# Patient Record
Sex: Male | Born: 1955 | Race: Black or African American | Hispanic: No | Marital: Married | Smoking: Former smoker
Health system: Southern US, Community
[De-identification: ages and names within clinical notes are randomized; demographics above are authoritative.]

## PROBLEM LIST (undated history)

## (undated) DIAGNOSIS — K219 Gastro-esophageal reflux disease without esophagitis: Secondary | ICD-10-CM

## (undated) DIAGNOSIS — E079 Disorder of thyroid, unspecified: Secondary | ICD-10-CM

## (undated) DIAGNOSIS — K635 Polyp of colon: Secondary | ICD-10-CM

## (undated) DIAGNOSIS — E785 Hyperlipidemia, unspecified: Secondary | ICD-10-CM

## (undated) DIAGNOSIS — I10 Essential (primary) hypertension: Secondary | ICD-10-CM

## (undated) DIAGNOSIS — M1990 Unspecified osteoarthritis, unspecified site: Secondary | ICD-10-CM

## (undated) HISTORY — PX: ELBOW SURGERY: SHX618

## (undated) HISTORY — DX: Gastro-esophageal reflux disease without esophagitis: K21.9

## (undated) HISTORY — DX: Unspecified osteoarthritis, unspecified site: M19.90

## (undated) HISTORY — DX: Essential (primary) hypertension: I10

## (undated) HISTORY — PX: HERNIA REPAIR: SHX51

## (undated) HISTORY — PX: ROTATOR CUFF REPAIR: SHX139

## (undated) HISTORY — DX: Disorder of thyroid, unspecified: E07.9

## (undated) HISTORY — PX: KNEE SURGERY: SHX244

## (undated) HISTORY — PX: EYE SURGERY: SHX253

## (undated) HISTORY — DX: Hyperlipidemia, unspecified: E78.5

## (undated) HISTORY — DX: Polyp of colon: K63.5

---

## 2018-04-06 LAB — THYROID PEROXIDASE ANTIBODIES (TPO) (REFL): Thyroid Peroxidase Ab: 31.6

## 2018-04-06 LAB — TSH: TSH: 5.49 (ref 0.41–5.90)

## 2018-07-29 LAB — URIC ACID: Uric Acid: 5.6

## 2018-09-01 LAB — URIC ACID: Uric Acid: 4.6

## 2018-12-24 LAB — CBC AND DIFFERENTIAL
HCT: 42 (ref 41–53)
Hemoglobin: 13.8 (ref 13.5–17.5)
Platelets: 233 (ref 150–399)
WBC: 3.8

## 2018-12-24 LAB — FECAL OCCULT BLOOD, GUAIAC: Fecal Occult Blood: NEGATIVE

## 2018-12-24 LAB — CBC: RBC: 4.82 (ref 3.87–5.11)

## 2018-12-24 LAB — BASIC METABOLIC PANEL
BUN: 8 (ref 4–21)
CO2: 21 (ref 13–22)
Chloride: 105 (ref 99–108)
Creatinine: 1 (ref 0.6–1.3)
Glucose: 86
Potassium: 4 (ref 3.4–5.3)
Sodium: 142 (ref 137–147)

## 2018-12-24 LAB — TSH: TSH: 4.11 (ref 0.41–5.90)

## 2018-12-24 LAB — VITAMIN D 25 HYDROXY (VIT D DEFICIENCY, FRACTURES): Vit D, 25-Hydroxy: 34.9

## 2018-12-24 LAB — HEPATIC FUNCTION PANEL
ALT: 46 — AB (ref 10–40)
AST: 29 (ref 14–40)
Alkaline Phosphatase: 78 (ref 25–125)
Bilirubin, Total: 0.8

## 2018-12-24 LAB — HEMOGLOBIN A1C: Hemoglobin A1C: 5.7

## 2018-12-24 LAB — LIPID PANEL
Cholesterol: 105 (ref 0–200)
HDL: 39 (ref 35–70)
LDL Cholesterol: 42
Triglycerides: 118 (ref 40–160)

## 2018-12-24 LAB — COMPREHENSIVE METABOLIC PANEL: Calcium: 9.9 (ref 8.7–10.7)

## 2018-12-24 LAB — PSA: PSA: 0.8

## 2019-07-12 ENCOUNTER — Encounter: Payer: Self-pay | Admitting: Family Medicine

## 2019-07-12 ENCOUNTER — Other Ambulatory Visit: Payer: Self-pay

## 2019-07-12 ENCOUNTER — Ambulatory Visit: Payer: Federal, State, Local not specified - PPO | Admitting: Family Medicine

## 2019-07-12 VITALS — BP 140/80 | HR 63 | Temp 96.3°F | Ht 67.0 in | Wt 216.8 lb

## 2019-07-12 DIAGNOSIS — I10 Essential (primary) hypertension: Secondary | ICD-10-CM

## 2019-07-12 DIAGNOSIS — M109 Gout, unspecified: Secondary | ICD-10-CM | POA: Diagnosis not present

## 2019-07-12 DIAGNOSIS — M1A9XX0 Chronic gout, unspecified, without tophus (tophi): Secondary | ICD-10-CM | POA: Insufficient documentation

## 2019-07-12 DIAGNOSIS — Z1211 Encounter for screening for malignant neoplasm of colon: Secondary | ICD-10-CM

## 2019-07-12 DIAGNOSIS — Z8739 Personal history of other diseases of the musculoskeletal system and connective tissue: Secondary | ICD-10-CM | POA: Insufficient documentation

## 2019-07-12 DIAGNOSIS — E782 Mixed hyperlipidemia: Secondary | ICD-10-CM

## 2019-07-12 DIAGNOSIS — R200 Anesthesia of skin: Secondary | ICD-10-CM | POA: Diagnosis not present

## 2019-07-12 DIAGNOSIS — E785 Hyperlipidemia, unspecified: Secondary | ICD-10-CM | POA: Insufficient documentation

## 2019-07-12 NOTE — Assessment & Plan Note (Signed)
Pt endorses not taking medication. Will f/u outside records and assess risk and readdress as needed

## 2019-07-12 NOTE — Patient Instructions (Addendum)
Gout - Stop colchicine - If not Gout symptoms after 2 weeks - Then stop the Allopurinol  - return if you get a gout flare   If you get a gout flare 1) Take Colchicine 0.6 mg Three times a day on the first day 2) Take Colchicine 0.6 mg twice a day on day 2 and until 2 days after symptoms improved 3) Make appointment if no improvement by day 2  #High Blood Pressure - Do home monitoring - if blood pressure >140/90 on a regular basis - make a follow-up appointment to discuss

## 2019-07-12 NOTE — Assessment & Plan Note (Signed)
No issues recently. Treats with prn oxcarbazepine

## 2019-07-12 NOTE — Assessment & Plan Note (Signed)
Mildly elevated. Home monitoring and return if still high. Cont metoprolol and amlodipine

## 2019-07-12 NOTE — Progress Notes (Signed)
Subjective:     Alex Castaneda is a 64 y.o. male presenting for Establish Care     HPI   #gout - flare x 1 - started suppression x 1 year - no symptoms since then - did require multiple course of treatment for previous episode  Numbness in right cheek with cold weather  #HTN - moved about 1 month ago - has not checked his bp at home - took medication a little later than normal  #HLD - has not taking the lipitor due to getting regular blood work  Review of Systems   Social History   Tobacco Use  Smoking Status Former Smoker  . Packs/day: 1.00  . Years: 18.00  . Pack years: 18.00  . Types: Cigarettes  . Quit date: 07/11/1989  . Years since quitting: 30.0  Smokeless Tobacco Never Used        Objective:    BP Readings from Last 3 Encounters:  07/12/19 140/80   Wt Readings from Last 3 Encounters:  07/12/19 216 lb 12.8 oz (98.3 kg)    BP 140/80   Pulse 63   Temp (!) 96.3 F (35.7 C) (Temporal)   Ht 5\' 7"  (1.702 m)   Wt 216 lb 12.8 oz (98.3 kg)   SpO2 100%   BMI 33.96 kg/m    Physical Exam Constitutional:      Appearance: Normal appearance. He is not ill-appearing or diaphoretic.  HENT:     Right Ear: External ear normal.     Left Ear: External ear normal.  Eyes:     General: No scleral icterus.    Extraocular Movements: Extraocular movements intact.     Conjunctiva/sclera: Conjunctivae normal.  Cardiovascular:     Rate and Rhythm: Normal rate and regular rhythm.     Heart sounds: No murmur.  Pulmonary:     Effort: Pulmonary effort is normal. No respiratory distress.     Breath sounds: Normal breath sounds. No wheezing.  Musculoskeletal:     Cervical back: Neck supple.  Skin:    General: Skin is warm and dry.  Neurological:     Mental Status: He is alert. Mental status is at baseline.  Psychiatric:        Mood and Affect: Mood normal.           Assessment & Plan:   Problem List Items Addressed This Visit      Cardiovascular  and Mediastinum   Hypertension - Primary    Mildly elevated. Home monitoring and return if still high. Cont metoprolol and amlodipine      Relevant Medications   amLODipine (NORVASC) 5 MG tablet   atorvastatin (LIPITOR) 20 MG tablet   metoprolol tartrate (LOPRESSOR) 50 MG tablet     Other   Gout attack    Discussed stopping preventative medication as hx of only 1 gout attack. Will trial stopping and if gout attack will return to discuss restarting and management options      Facial numbness    No issues recently. Treats with prn oxcarbazepine      Hyperlipidemia    Pt endorses not taking medication. Will f/u outside records and assess risk and readdress as needed      Relevant Medications   amLODipine (NORVASC) 5 MG tablet   atorvastatin (LIPITOR) 20 MG tablet   metoprolol tartrate (LOPRESSOR) 50 MG tablet    Other Visit Diagnoses    Colon cancer screening       Relevant Orders   Ambulatory  referral to Gastroenterology       Return in about 6 months (around 01/09/2020).  Lesleigh Noe, MD

## 2019-07-12 NOTE — Assessment & Plan Note (Signed)
Discussed stopping preventative medication as hx of only 1 gout attack. Will trial stopping and if gout attack will return to discuss restarting and management options

## 2019-07-13 ENCOUNTER — Encounter: Payer: Self-pay | Admitting: Family Medicine

## 2019-07-13 ENCOUNTER — Telehealth: Payer: Self-pay | Admitting: Family Medicine

## 2019-07-13 DIAGNOSIS — M1990 Unspecified osteoarthritis, unspecified site: Secondary | ICD-10-CM

## 2019-07-13 DIAGNOSIS — K219 Gastro-esophageal reflux disease without esophagitis: Secondary | ICD-10-CM | POA: Insufficient documentation

## 2019-07-13 DIAGNOSIS — E559 Vitamin D deficiency, unspecified: Secondary | ICD-10-CM | POA: Insufficient documentation

## 2019-07-13 HISTORY — DX: Unspecified osteoarthritis, unspecified site: M19.90

## 2019-07-13 NOTE — Telephone Encounter (Signed)
Record request re sent for the dates they do have records for

## 2019-07-13 NOTE — Telephone Encounter (Signed)
Dr. Channing Mutters office called regarding medical records request. States it was requesting records from 2018-present. She said patient was last seen in 2017 so they do not have records from that time frame.

## 2019-07-19 ENCOUNTER — Encounter: Payer: Self-pay | Admitting: Family Medicine

## 2019-07-19 DIAGNOSIS — G500 Trigeminal neuralgia: Secondary | ICD-10-CM

## 2019-07-19 HISTORY — DX: Trigeminal neuralgia: G50.0

## 2019-07-20 ENCOUNTER — Encounter: Payer: Self-pay | Admitting: Family Medicine

## 2019-07-20 DIAGNOSIS — M48061 Spinal stenosis, lumbar region without neurogenic claudication: Secondary | ICD-10-CM | POA: Insufficient documentation

## 2019-07-20 HISTORY — DX: Spinal stenosis, lumbar region without neurogenic claudication: M48.061

## 2019-07-26 ENCOUNTER — Telehealth: Payer: Self-pay | Admitting: Gastroenterology

## 2019-07-26 NOTE — Telephone Encounter (Signed)
DOD 07/23/19   Dr. Ardis Hughs, there is a referral from Dr. Einar Pheasant for a colonoscopy.  Pt's 2015 colonoscopy report will be sent to you for review.  Please advise scheduling.

## 2019-09-09 ENCOUNTER — Other Ambulatory Visit: Payer: Self-pay

## 2019-09-09 ENCOUNTER — Ambulatory Visit: Payer: Federal, State, Local not specified - PPO | Admitting: Primary Care

## 2019-09-09 ENCOUNTER — Encounter: Payer: Self-pay | Admitting: Primary Care

## 2019-09-09 VITALS — BP 136/80 | HR 59 | Temp 97.0°F | Ht 67.0 in | Wt 218.5 lb

## 2019-09-09 DIAGNOSIS — M79672 Pain in left foot: Secondary | ICD-10-CM

## 2019-09-09 DIAGNOSIS — G8929 Other chronic pain: Secondary | ICD-10-CM | POA: Diagnosis not present

## 2019-09-09 DIAGNOSIS — Z8739 Personal history of other diseases of the musculoskeletal system and connective tissue: Secondary | ICD-10-CM | POA: Diagnosis not present

## 2019-09-09 DIAGNOSIS — M109 Gout, unspecified: Secondary | ICD-10-CM

## 2019-09-09 LAB — BASIC METABOLIC PANEL
BUN: 9 mg/dL (ref 6–23)
CO2: 28 mEq/L (ref 19–32)
Calcium: 9.6 mg/dL (ref 8.4–10.5)
Chloride: 106 mEq/L (ref 96–112)
Creatinine, Ser: 1.07 mg/dL (ref 0.40–1.50)
GFR: 84.53 mL/min (ref >60.00)
Glucose, Bld: 99 mg/dL (ref 70–99)
Potassium: 3.8 mEq/L (ref 3.5–5.1)
Sodium: 140 mEq/L (ref 135–145)

## 2019-09-09 LAB — URIC ACID: Uric Acid, Serum: 4.3 mg/dL (ref 4.0–7.8)

## 2019-09-09 NOTE — Assessment & Plan Note (Signed)
Unclear if his symptoms today are secondary to gout, could be arthritis, especially since his pain is in an entirely different location. No left ankle pain or swelling.   Check uric acid level today. Offered xray for evaluation of arthritis, he kindly declines.   Continue allopurinol 200 mg and colchicine 0.6 mg daily for now.

## 2019-09-09 NOTE — Patient Instructions (Signed)
Stop by the lab prior to leaving today. I will notify you of your results once received.   It was a pleasure meeting you!  

## 2019-09-09 NOTE — Progress Notes (Signed)
Subjective:    Patient ID: Alex Castaneda, male    DOB: 09-19-1955, 64 y.o.   MRN: [id number]  HPI  This visit occurred during the SARS-CoV-2 public health emergency.  Safety protocols were in place, including screening questions prior to the visit, additional usage of staff PPE, and extensive cleaning of exam room while observing appropriate contact time as indicated for disinfecting solutions.   Alex Castaneda is a 64 year old male patient of Dr. Einar Pheasant with a history of GERD, chronic gout, hypertension (not on diuretics), trigeminal neuralgia who presents today with a chief complaint of gout attack.  Currently managed on allopurinol 200 mg daily and colchicine 0.6 mg daily. History of one gout attack about 14 months ago, symptoms of left ankle pain and significant swelling at the time.  He had been on allopurinol 200 mg for one year, weaned off of allopurinol in January 2021 as he had no gout flares in one year. Two weeks after weaning off he noticed pain to the left 5th metatarsal joint. He resumed allopurinol 200 mg and colchicine 0.6 mg  in early February 2021 with improvement but without resolve.  He denies pain and swelling to the left ankle. He continues to have pain to the left 5th metatarsal joint. Pain is worse in the morning "when my feet hit the floor", some reduction during the day, worse with increased activity.   He denies erythema, swelling, recent injury.  BP Readings from Last 3 Encounters:  09/09/19 136/80  07/12/19 140/80     Review of Systems  Musculoskeletal: Positive for arthralgias. Negative for joint swelling.  Skin: Negative for color change.  Neurological: Negative for numbness.       Past Medical History:  Diagnosis Date  . Arthritis   . Colon polyps   . GERD (gastroesophageal reflux disease)   . Hyperlipidemia   . Hypertension   . Thyroid disease      Social History   Socioeconomic History  . Marital status: Married    Spouse name: Chrys Racer  .  Number of children: 2  . Years of education: Associates degree  . Highest education level: Not on file  Occupational History  . Not on file  Tobacco Use  . Smoking status: Former Smoker    Packs/day: 1.00    Years: 18.00    Pack years: 18.00    Types: Cigarettes    Quit date: 07/11/1989    Years since quitting: 30.1  . Smokeless tobacco: Never Used  Substance and Sexual Activity  . Alcohol use: Not Currently    Comment: 1 drink 1-2 time a month  . Drug use: Never  . Sexual activity: Not Currently  Other Topics Concern  . 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 Difficulty of  Paying Living Expenses:   Food Insecurity:   . Worried About Charity fundraiser in the Last Year:   . Arboriculturist in the Last Year:   Transportation Needs:   . Film/video editor (Medical):   Marland Kitchen Lack of Transportation (Non-Medical):   Physical Activity:   . Days of Exercise per Week:   . Minutes of Exercise per Session:   Stress:   . Feeling of Stress :   Social Connections:   . Frequency of Communication with Friends and Family:   . Frequency of Social Gatherings with Friends and Family:   . Attends Religious Services:   . Active Member of Clubs or Organizations:   . Attends Archivist Meetings:   Marland Kitchen Marital Status:   Intimate Partner Violence:   . Fear of Current or Ex-Partner:   . Emotionally Abused:   Marland Kitchen Physically Abused:   . Sexually Abused:       Past Surgical History:  Procedure Laterality Date  . ELBOW SURGERY Right   . HERNIA REPAIR     Umbilical  . KNEE SURGERY Left    arthroscopic  . ROTATOR CUFF REPAIR Right     Family History  Problem Relation Age of Onset  . Throat cancer Mother        smoker  . COPD Sister   . Cancer Brother        unknown  . Arthritis Sister   . Diabetes Sister   . Diabetes Sister   . Stroke Brother 72       tobacco use    No Known Allergies  Current Outpatient Medications on File Prior to Visit  Medication Sig Dispense Refill  . allopurinol (ZYLOPRIM) 100 MG tablet Take 100 mg by mouth daily.    Marland Kitchen amLODipine (NORVASC) 5 MG tablet Take 5 mg by mouth daily.    Marland Kitchen atorvastatin (LIPITOR) 20 MG tablet Take 20 mg by mouth daily.    . colchicine 0.6 MG tablet Take 0.6 mg by mouth daily.    . metoprolol tartrate (LOPRESSOR) 50 MG tablet Take 50 mg by mouth 2 (two) times daily.    Marland Kitchen omeprazole (PRILOSEC) 20 MG capsule Take 20 mg by mouth daily.    . Oxcarbazepine (TRILEPTAL) 300 MG tablet Take 300 mg by mouth 2 (two) times daily.     No current facility-administered medications on file prior to visit.    BP 136/80   Pulse (!) 59   Temp (!) 97 F (36.1 C) (Temporal)   Ht 5\' 7"  (1.702 m)   Wt 218 lb 8 oz (99.1 kg)   SpO2 99%   BMI 34.22 kg/m    Objective:   Physical Exam  Constitutional: He appears well-nourished.  Cardiovascular:  Pulses:      Dorsalis pedis pulses are 2+ on the right side and 2+ on the left side.       Posterior tibial pulses are 2+ on the right side and 2+ on the left side.  Respiratory: Effort normal.  Musculoskeletal:     Right foot: Normal range of motion. No deformity or tenderness.     Left foot: Normal range of motion. Tenderness present. No deformity.       Feet:     Comments: Mild tenderness to left 5th metatarsal joint as noted in picture.   Skin: Skin is warm and dry. No erythema.           Assessment & Plan:

## 2019-09-13 ENCOUNTER — Other Ambulatory Visit: Payer: Self-pay

## 2019-09-13 ENCOUNTER — Ambulatory Visit (INDEPENDENT_AMBULATORY_CARE_PROVIDER_SITE_OTHER)
Admission: RE | Admit: 2019-09-13 | Discharge: 2019-09-13 | Disposition: A | Discharge status: Home / Self Care | Payer: Federal, State, Local not specified - PPO | Source: Ambulatory Visit | Attending: Primary Care | Admitting: Primary Care

## 2019-09-13 ENCOUNTER — Ambulatory Visit: Payer: Federal, State, Local not specified - PPO | Admitting: Primary Care

## 2019-09-13 VITALS — BP 134/84 | HR 63 | Temp 96.7°F | Ht 67.0 in | Wt 216.5 lb

## 2019-09-13 DIAGNOSIS — G8929 Other chronic pain: Secondary | ICD-10-CM

## 2019-09-13 DIAGNOSIS — M79672 Pain in left foot: Secondary | ICD-10-CM

## 2019-09-13 MED ORDER — NAPROXEN SODIUM 550 MG PO TABS: 550.0000 mg | ORAL_TABLET | Freq: Two times a day (BID) | ORAL | 0 refills | Status: DC

## 2019-09-13 NOTE — Patient Instructions (Signed)
Complete xray(s) prior to leaving today. I will notify you of your results once received.  You may take the naproxen sodium medication twice daily with a meal for pain and inflammation. Do this for 5-7 days.  Please update Dr. Einar Pheasant or myself if no improvement.   It was a pleasure to see you today!

## 2019-09-13 NOTE — Assessment & Plan Note (Signed)
Not associated with gout as suspected, uric acid level of 4.3 with normal renal function.  Suspect osteoarthritis. Check plain films to rule out tophi and evaluate. Exam today overall benign.  Discussed treatment with NSAID's, orthotics, insoles into shoes, stretching. Consider podiatry evaluation if needed.

## 2019-09-13 NOTE — Progress Notes (Signed)
Subjective:    Patient ID: Alex Castaneda, male    DOB: 1955/07/20, 64 y.o.   MRN: [id number]  HPI  This visit occurred during the SARS-CoV-2 public health emergency.  Safety protocols were in place, including screening questions prior to the visit, additional usage of staff PPE, and extensive cleaning of exam room while observing appropriate contact time as indicated for disinfecting solutions.   Alex Castaneda is a 64 year old male with a history of chronic gout, trigeminal neuralgia, osteoarthritis, hyperlipidemia who presents today with a chief complaint of foot pain.  He was last evaluated on 09/09/19 with a 6 week history of dorsal left 5th metatarsal joint pain. Had resumed allopurinol 200 mg in early February, one history of gout attack total. Symptoms during his last visit were not located the site of his prior gout attack location. During his last visit we checked his uric acid level which was 4.3 on allopurinol 200 mg. Normal renal function.   Since his last visit he's continued to notice left 5th metatarsal and left lateral dorsal foot pain. Pain is worse in the morning and with activity, improved with rest. He's also started noticing left knee pain. He's not taking anything OTC for symptoms. He no longer wears orthotics for which he once wore due to a bunion.   He denies joint swelling, erythema, trauma. His pain is about the same.   Review of Systems  Musculoskeletal: Positive for arthralgias. Negative for joint swelling.  Skin: Negative for color change.  Neurological: Negative for numbness.       Past Medical History:  Diagnosis Date  . Arthritis   . Colon polyps   . GERD (gastroesophageal reflux disease)   . Hyperlipidemia   . Hypertension   . Thyroid disease      Social History   Socioeconomic History  . Marital status: Married    Spouse name: Alex Castaneda  . Number of children: 2  . Years of education: Associates degree  . Highest education level: Not on file    Occupational History  . Not on file  Tobacco Use  . Smoking status: Former Smoker    Packs/day: 1.00    Years: 18.00    Pack years: 18.00    Types: Cigarettes    Quit date: 07/11/1989    Years since quitting: 30.1  . Smokeless tobacco: Never Used  Substance and Sexual Activity  . Alcohol use: Not Currently    Comment: 1 drink 1-2 time a month  . Drug use: Never  . Sexual activity: Not Currently  Other Topics Concern  . 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 Difficulty of Paying Living Expenses:   Food Insecurity:   . Worried About Charity fundraiser in the Last Year:   .  Ran Out of Food in the Last Year:   Transportation Needs:   . Film/video editor (Medical):   Marland Kitchen Lack of Transportation (Non-Medical):   Physical Activity:   . Days of Exercise per Week:   . Minutes of Exercise per Session:   Stress:   . Feeling of Stress :   Social Connections:   . Frequency of Communication with Friends and Family:   . Frequency of Social Gatherings with Friends and Family:   . Attends Religious Services:   . Active Member of Clubs or Organizations:   . Attends Archivist Meetings:   Marland Kitchen Marital Status:   Intimate Partner Violence:   . Fear of Current or Ex-Partner:   . Emotionally Abused:   Marland Kitchen Physically Abused:   . Sexually Abused:     Past Surgical History:  Procedure Laterality Date  . ELBOW SURGERY Right   . HERNIA REPAIR      Umbilical  . KNEE SURGERY Left    arthroscopic  . ROTATOR CUFF REPAIR Right     Family History  Problem Relation Age of Onset  . Throat cancer Mother        smoker  . COPD Sister   . Cancer Brother        unknown  . Arthritis Sister   . Diabetes Sister   . Diabetes Sister   . Stroke Brother 4       tobacco use    No Known Allergies  Current Outpatient Medications on File Prior to Visit  Medication Sig Dispense Refill  . allopurinol (ZYLOPRIM) 100 MG tablet Take 100 mg by mouth daily.    Marland Kitchen amLODipine (NORVASC) 5 MG tablet Take 5 mg by mouth daily.    Marland Kitchen atorvastatin (LIPITOR) 20 MG tablet Take 20 mg by mouth daily.    . colchicine 0.6 MG tablet Take 0.6 mg by mouth daily.    . metoprolol tartrate (LOPRESSOR) 50 MG tablet Take 50 mg by mouth 2 (two) times daily.    Marland Kitchen omeprazole (PRILOSEC) 20 MG capsule Take 20 mg by mouth daily.    . Oxcarbazepine (TRILEPTAL) 300 MG tablet Take 300 mg by mouth 2 (two) times daily.     No current facility-administered medications on file prior to visit.    BP 134/84   Pulse 63   Temp (!) 96.7 F (35.9 C) (Temporal)   Ht 5\' 7"  (1.702 m)   Wt 216 lb 8 oz (98.2 kg)   SpO2 98%   BMI 33.91 kg/m    Objective:   Physical Exam  Constitutional: He appears well-nourished.  Cardiovascular:  Pulses:      Dorsalis pedis pulses are 2+ on the left side.       Posterior tibial pulses are 2+ on the left side.  Respiratory: Effort normal.  Musculoskeletal:     Left foot: Normal range of motion. No swelling, deformity, tenderness or bony tenderness.       Feet:  Skin: Skin is warm and dry. No erythema.           Assessment & Plan:

## 2019-09-19 ENCOUNTER — Other Ambulatory Visit: Payer: Self-pay | Admitting: Family Medicine

## 2019-09-20 NOTE — Telephone Encounter (Signed)
Colchicine has not been filled by Dr Einar Pheasant before yet.  LOV 09/13/19 with Alma Friendly for chronic foot pain. LOV with Dr Einar Pheasant was on 07/12/19. Future appointment on 01/10/20

## 2019-09-24 ENCOUNTER — Encounter: Payer: Self-pay | Admitting: Family Medicine

## 2019-09-25 ENCOUNTER — Encounter: Payer: Self-pay | Admitting: Family Medicine

## 2019-09-27 ENCOUNTER — Encounter: Payer: Self-pay | Admitting: Family Medicine

## 2019-09-27 NOTE — Telephone Encounter (Signed)
Abstracted Uric acids x 2

## 2019-09-27 NOTE — Telephone Encounter (Signed)
Dr Einar Pheasant I updated vaccine record. Lab results I did not abstract since we had more recent one in the system but let me know if you need me to abstract any.

## 2019-09-27 NOTE — Telephone Encounter (Signed)
Please abstract the last 2 uric acid levels.

## 2019-09-30 MED ORDER — AMLODIPINE BESYLATE 5 MG PO TABS: 5.0000 mg | ORAL_TABLET | Freq: Every day | ORAL | 3 refills | Status: DC

## 2019-09-30 NOTE — Telephone Encounter (Signed)
Dr Einar Pheasant I pulled in Amlodipine medication for review.

## 2019-10-04 ENCOUNTER — Encounter: Payer: Self-pay | Admitting: Family Medicine

## 2019-10-05 NOTE — Telephone Encounter (Signed)
Dr Einar Pheasant, I suggested that patient call us to set up an appointment.

## 2019-10-10 DIAGNOSIS — K047 Periapical abscess without sinus: Secondary | ICD-10-CM | POA: Diagnosis not present

## 2019-10-26 DIAGNOSIS — H02883 Meibomian gland dysfunction of right eye, unspecified eyelid: Secondary | ICD-10-CM | POA: Diagnosis not present

## 2019-11-10 ENCOUNTER — Encounter: Payer: Self-pay | Admitting: Primary Care

## 2019-11-10 ENCOUNTER — Other Ambulatory Visit: Payer: Self-pay

## 2019-11-10 ENCOUNTER — Ambulatory Visit: Payer: Federal, State, Local not specified - PPO | Admitting: Primary Care

## 2019-11-10 DIAGNOSIS — R519 Headache, unspecified: Secondary | ICD-10-CM | POA: Insufficient documentation

## 2019-11-10 MED ORDER — PREDNISONE 20 MG PO TABS: ORAL_TABLET | ORAL | 0 refills | Status: DC

## 2019-11-10 NOTE — Patient Instructions (Signed)
Start prednisone 20 mg. Take 2 tablets daily for five days.  Hold off on the Flonase for now.  You can use Tylenol if needed, no Ibuprofen.  It was a pleasure to see you today!

## 2019-11-10 NOTE — Assessment & Plan Note (Signed)
Acute for the last four days, exam and HPI with absence of bacterial involvement.   He has had some improvement on Flonase, is requesting oral steroids as this was very helpful in the past. Agreed to provide.  Rx for short burst of prednisone provided. He will update.

## 2019-11-10 NOTE — Progress Notes (Signed)
Subjective:    Patient ID: Alex Castaneda, male    DOB: 04/14/1956, 64 y.o.   MRN: [id number]  HPI  This visit occurred during the SARS-CoV-2 public health emergency.  Safety protocols were in place, including screening questions prior to the visit, additional usage of staff PPE, and extensive cleaning of exam room while observing appropriate contact time as indicated for disinfecting solutions.   Alex Castaneda is a 64 year old male with a history of GERD, trigeminal neuralgia who presents today with a chief complaint of sinus pressure.  He also reports frontal lobe headache with radiation down into the nasal cavity. He denies rhinorrhea, cough, ear pain, fevers, dental pain, itchy/watey eyes. Symptoms began about four days ago.   He's used Flonase with some improvement.   Review of Systems  Constitutional: Negative for fatigue and fever.  HENT: Positive for congestion and sinus pressure. Negative for ear pain, postnasal drip, rhinorrhea and sore throat.   Eyes: Negative for itching.  Respiratory: Negative for cough.   Neurological: Positive for headaches.       Past Medical History:  Diagnosis Date  . Arthritis   . Colon polyps   . GERD (gastroesophageal reflux disease)   . Hyperlipidemia   . Hypertension   . Thyroid disease      Social History   Socioeconomic History  . Marital status: Married    Spouse name: Chrys Racer  . Number of children: 2  . Years of education: Associates degree  . Highest education level: Not on file  Occupational History  . Not on file  Tobacco Use  . Smoking status: Former Smoker    Packs/day: 1.00    Years: 18.00    Pack years: 18.00    Types: Cigarettes    Quit date: 07/11/1989    Years since quitting: 30.3  . Smokeless tobacco: Never Used  Substance and Sexual Activity  . Alcohol use: Not Currently    Comment: 1 drink 1-2 time a month  . Drug use: Never  . Sexual activity: Not Currently  Other Topics Concern  . Not on file  Social  History Narrative   07/12/19   From: moved from [place] - but from [place] originally    Living: with wife Chrys Racer - 85 years   Work: retired from [employer]: 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, dispensing and [place] - live nearby ([place], [place]) - grandkids - 8 - and 2 great grandchildren      Enjoys: working on cars, walking, helping and supporting family      Exercise: walking - regularly, hard in the winter   Diet: better now      Safety   Seat belts: Yes    Guns: No   Safe in relationships: Yes    Social Determinants of Radio broadcast assistant Strain:   . Difficulty of Paying Living Expenses:   Food Insecurity:   . Worried About Charity fundraiser in the Last Year:   . Arboriculturist in the Last Year:   Transportation Needs:   . Film/video editor (Medical):   Marland Kitchen Lack of Transportation (Non-Medical):   Physical Activity:   . Days of Exercise per Week:   . Minutes of Exercise per Session:   Stress:   . Feeling of Stress :   Social Connections:   . Frequency of Communication with Friends and Family:   . Frequency of Social Gatherings with Friends and Family:   .  Attends Religious Services:   . Active Member of Clubs or Organizations:   . Attends Archivist Meetings:   Marland Kitchen Marital Status:   Intimate Partner Violence:   . Fear of Current or Ex-Partner:   . Emotionally Abused:   Marland Kitchen Physically Abused:   . Sexually Abused:     Past Surgical History:  Procedure Laterality Date  . ELBOW SURGERY Right   . HERNIA REPAIR     Umbilical  . KNEE SURGERY Left    arthroscopic  . ROTATOR CUFF REPAIR Right     Family History  Problem Relation Age of Onset  . Throat cancer Mother        smoker  . COPD Sister   . Cancer Brother        unknown  . Arthritis Sister   . Diabetes Sister   . Diabetes Sister   . Stroke Brother 2       tobacco use    No Known Allergies  Current Outpatient Medications on File Prior to Visit  Medication Sig Dispense Refill  . allopurinol  (ZYLOPRIM) 100 MG tablet Take 100 mg by mouth daily.    Marland Kitchen amLODipine (NORVASC) 5 MG tablet Take 1 tablet (5 mg total) by mouth daily. 90 tablet 3  . atorvastatin (LIPITOR) 20 MG tablet Take 20 mg by mouth daily.    . colchicine 0.6 MG tablet TAKE 1 TABLET BY MOUTH DAILY 30 tablet 1  . metoprolol tartrate (LOPRESSOR) 50 MG tablet Take 50 mg by mouth 2 (two) times daily.    . naproxen sodium (ANAPROX DS) 550 MG tablet Take 1 tablet (550 mg total) by mouth 2 (two) times daily with a meal. For pain and inflammation. 14 tablet 0  . omeprazole (PRILOSEC) 20 MG capsule Take 20 mg by mouth daily.    . Oxcarbazepine (TRILEPTAL) 300 MG tablet Take 300 mg by mouth 2 (two) times daily.     No current facility-administered medications on file prior to visit.    BP 130/82   Pulse 60   Temp (!) 96.1 F (35.6 C) (Temporal)   Ht 5\' 7"  (1.702 m)   Wt 217 lb 8 oz (98.7 kg)   SpO2 98%   BMI 34.07 kg/m    Objective:   Physical Exam  Constitutional: He appears well-nourished. He does not appear ill.  HENT:  Right Ear: Tympanic membrane and ear canal normal.  Left Ear: Tympanic membrane and ear canal normal.  Nose: No mucosal edema. Right sinus exhibits no maxillary sinus tenderness and no frontal sinus tenderness. Left sinus exhibits no maxillary sinus tenderness and no frontal sinus tenderness.  Mouth/Throat: Oropharynx is clear and moist.  Cardiovascular: Normal rate and regular rhythm.  Respiratory: Effort normal and breath sounds normal. He has no wheezes.  Musculoskeletal:     Cervical back: Neck supple.  Skin: Skin is warm and dry.           Assessment & Plan:

## 2019-11-16 ENCOUNTER — Other Ambulatory Visit: Payer: Self-pay

## 2019-11-16 ENCOUNTER — Encounter: Payer: Self-pay | Admitting: Podiatry

## 2019-11-16 ENCOUNTER — Ambulatory Visit (INDEPENDENT_AMBULATORY_CARE_PROVIDER_SITE_OTHER): Payer: Federal, State, Local not specified - PPO | Admitting: Podiatry

## 2019-11-16 DIAGNOSIS — M778 Other enthesopathies, not elsewhere classified: Secondary | ICD-10-CM

## 2019-11-16 MED ORDER — NAPROXEN 500 MG PO TABS: 500.0000 mg | ORAL_TABLET | Freq: Two times a day (BID) | ORAL | 0 refills | Status: DC

## 2019-11-16 NOTE — Progress Notes (Signed)
   HPI: 64 y.o. male presenting today as a new patient for evaluation of pain to the left foot.  Patient states that over the past 3-4 months he experienced sharp pain that developed to the lateral aspect of the left foot.  He would experience a stiff sore sharp ache to that area.  Pain was worse in the morning and it would slowly improve as the day went on and he began walking.  He went to his primary care physician who prescribed Naprosyn 2 times daily.  He also began walking about 1 week ago and this helped significantly.  Currently the patient has little to no pain associated with the left foot.  Past Medical History:  Diagnosis Date  . Arthritis   . Colon polyps   . GERD (gastroesophageal reflux disease)   . Hyperlipidemia   . Hypertension   . Thyroid disease      Physical Exam: General: The patient is alert and oriented x3 in no acute distress.  Dermatology: Skin is warm, dry and supple bilateral lower extremities. Negative for open lesions or macerations.  Vascular: Palpable pedal pulses bilaterally. No edema or erythema noted. Capillary refill within normal limits.  Neurological: Epicritic and protective threshold grossly intact bilaterally.   Musculoskeletal Exam: Range of motion within normal limits to all pedal and ankle joints bilateral. Muscle strength 5/5 in all groups bilateral.  There is some mild tenderness to the lateral aspect of the left midfoot with palpation   Assessment: 1.  Capsulitis left lateral foot-improving   Plan of Care:  1. Patient evaluated. X-Rays reviewed that were taken on 09/13/2019.  2.  Refill prescription for Naprosyn 500 mg 2 times daily to take as needed as needed pain 3.  Continue wearing good supportive Brooks running shoes 4.  Slowly resume full activity no restrictions over the next 3-4 weeks 5.  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 Edrick Kins, DPM    [address], [place] [address]                Office [phone]  Fax [phone]

## 2019-11-17 ENCOUNTER — Ambulatory Visit: Payer: Federal, State, Local not specified - PPO | Admitting: Family Medicine

## 2019-11-17 ENCOUNTER — Other Ambulatory Visit: Payer: Self-pay | Admitting: Family Medicine

## 2019-11-18 DIAGNOSIS — R519 Headache, unspecified: Secondary | ICD-10-CM

## 2019-11-18 MED ORDER — AMOXICILLIN-POT CLAVULANATE 875-125 MG PO TABS: 1.0000 | ORAL_TABLET | Freq: Two times a day (BID) | ORAL | 0 refills | Status: DC

## 2019-11-23 NOTE — Telephone Encounter (Signed)
Rx was last refilled 09/20/19 for #30 with 1 refill. Patient has upcoming appt on 01/10/20. Is this ok to refill?

## 2019-11-30 ENCOUNTER — Other Ambulatory Visit: Payer: Self-pay

## 2019-11-30 ENCOUNTER — Ambulatory Visit: Payer: Federal, State, Local not specified - PPO | Admitting: Family Medicine

## 2019-11-30 ENCOUNTER — Encounter: Payer: Self-pay | Admitting: Family Medicine

## 2019-11-30 VITALS — BP 122/78 | HR 64 | Temp 97.5°F | Ht 67.0 in | Wt 213.0 lb

## 2019-11-30 DIAGNOSIS — R519 Headache, unspecified: Secondary | ICD-10-CM | POA: Diagnosis not present

## 2019-11-30 MED ORDER — DOXYCYCLINE HYCLATE 100 MG PO TABS: 100.0000 mg | ORAL_TABLET | Freq: Two times a day (BID) | ORAL | 0 refills | Status: AC

## 2019-11-30 NOTE — Assessment & Plan Note (Signed)
Pt reports hx of similar symptoms eventually diagnosed with complex sinus infection. Endorses some post-nasal symptoms triggering nausea and no other HA red flag symptoms. Given improvement on Abx but symptom return once stopping will try another course of abx, but refer to ENT so if symptoms return he can f/u with them for further treatment/evaluation.

## 2019-11-30 NOTE — Patient Instructions (Signed)
Sinus headache - will treat with another round of antibiotics - if no improvement, referral to ENT placed for follow-up - ok to continue to use tylenol and ibuprofen - if you develop worsening nausea and vomiting or neurological symptoms (weakness)

## 2019-11-30 NOTE — Progress Notes (Signed)
Subjective:     Alex Castaneda is a 64 y.o. male presenting for Sinus Problem     HPI   #Headache - frontal head - symptoms are every day - nagging headache - dull ache - not present all day - comes on late in the evening around bedtime and wakes up in the morning - endorses facial pressure as well - during the day will occasionally get nausea during the day - Treatment: tylenol for the HA which helps him go to sleep, in the morning will go away on its own  - had a similar situation 10-15 years ago - many tests run and eventually diagnosed with complex sinus --   Water - 1 gallon per day  Review of Systems  Constitutional: Negative for chills and fever.  HENT: Positive for sinus pressure. Negative for congestion, ear pain, postnasal drip, rhinorrhea, sinus pain, sneezing and sore throat.   Respiratory: Negative for cough.   Gastrointestinal: Positive for nausea. Negative for constipation, diarrhea and vomiting.  Neurological: Positive for headaches. Negative for dizziness and numbness.    11/10/2019: Clinic - sinus symptoms - prednisone course 11/18/2019: MyChart - no improvement - augmentin x 7 days.  11/25/2019: MyChart - sinus and headache - no improvement - tylenol and ibuprofen  Social History   Tobacco Use  Smoking Status Former Smoker  . Packs/day: 1.00  . Years: 18.00  . Pack years: 18.00  . Types: Cigarettes  . Quit date: 07/11/1989  . Years since quitting: 30.4  Smokeless Tobacco Never Used        Objective:    BP Readings from Last 3 Encounters:  11/30/19 122/78  11/10/19 130/82  09/13/19 134/84   Wt Readings from Last 3 Encounters:  11/30/19 213 lb (96.6 kg)  11/10/19 217 lb 8 oz (98.7 kg)  09/13/19 216 lb 8 oz (98.2 kg)    BP 122/78   Pulse 64   Temp (!) 97.5 F (36.4 C) (Temporal)   Ht 5\' 7"  (1.702 m)   Wt 213 lb (96.6 kg) Comment: per patient  SpO2 97%   BMI 33.36 kg/m    Physical Exam Constitutional:      Appearance: Normal  appearance. He is not ill-appearing or diaphoretic.  HENT:     Head: Normocephalic and atraumatic.     Right Ear: Tympanic membrane and external ear normal.     Left Ear: Tympanic membrane and external ear normal.     Nose: Nose normal. No congestion or rhinorrhea.     Right Sinus: No maxillary sinus tenderness or frontal sinus tenderness.     Left Sinus: No maxillary sinus tenderness or frontal sinus tenderness.     Mouth/Throat:     Mouth: Mucous membranes are moist.     Pharynx: No oropharyngeal exudate or posterior oropharyngeal erythema.  Eyes:     General: No scleral icterus.    Extraocular Movements: Extraocular movements intact.     Conjunctiva/sclera: Conjunctivae normal.  Cardiovascular:     Rate and Rhythm: Normal rate and regular rhythm.     Heart sounds: No murmur heard.   Pulmonary:     Effort: Pulmonary effort is normal. No respiratory distress.     Breath sounds: Normal breath sounds. No wheezing.  Musculoskeletal:     Cervical back: Neck supple.  Skin:    General: Skin is warm and dry.  Neurological:     Mental Status: He is alert. Mental status is at baseline.  Psychiatric:  Mood and Affect: Mood normal.           Assessment & Plan:   Problem List Items Addressed This Visit      Other   Sinus headache - Primary    Pt reports hx of similar symptoms eventually diagnosed with complex sinus infection. Endorses some post-nasal symptoms triggering nausea and no other HA red flag symptoms. Given improvement on Abx but symptom return once stopping will try another course of abx, but refer to ENT so if symptoms return he can f/u with them for further treatment/evaluation.       Relevant Medications   doxycycline (VIBRA-TABS) 100 MG tablet   Other Relevant Orders   Ambulatory referral to ENT       Return if symptoms worsen or fail to improve.  Lesleigh Noe, MD

## 2019-12-06 ENCOUNTER — Encounter: Payer: Self-pay | Admitting: Family Medicine

## 2019-12-07 ENCOUNTER — Encounter: Payer: Self-pay | Admitting: Family Medicine

## 2019-12-07 MED ORDER — ALLOPURINOL 100 MG PO TABS: 100.0000 mg | ORAL_TABLET | Freq: Every day | ORAL | 0 refills | Status: DC

## 2019-12-13 DIAGNOSIS — R519 Headache, unspecified: Secondary | ICD-10-CM | POA: Diagnosis not present

## 2019-12-13 DIAGNOSIS — R0981 Nasal congestion: Secondary | ICD-10-CM | POA: Diagnosis not present

## 2019-12-28 DIAGNOSIS — J309 Allergic rhinitis, unspecified: Secondary | ICD-10-CM | POA: Diagnosis not present

## 2019-12-28 DIAGNOSIS — J329 Chronic sinusitis, unspecified: Secondary | ICD-10-CM | POA: Diagnosis not present

## 2019-12-29 DIAGNOSIS — J301 Allergic rhinitis due to pollen: Secondary | ICD-10-CM | POA: Diagnosis not present

## 2020-01-10 ENCOUNTER — Encounter: Payer: Self-pay | Admitting: Family Medicine

## 2020-01-10 ENCOUNTER — Other Ambulatory Visit: Payer: Self-pay

## 2020-01-10 ENCOUNTER — Ambulatory Visit: Payer: Federal, State, Local not specified - PPO | Admitting: Family Medicine

## 2020-01-10 VITALS — BP 110/70 | HR 54 | Temp 98.0°F | Ht 67.0 in | Wt 212.0 lb

## 2020-01-10 DIAGNOSIS — Z1159 Encounter for screening for other viral diseases: Secondary | ICD-10-CM | POA: Diagnosis not present

## 2020-01-10 DIAGNOSIS — Z114 Encounter for screening for human immunodeficiency virus [HIV]: Secondary | ICD-10-CM | POA: Diagnosis not present

## 2020-01-10 DIAGNOSIS — E782 Mixed hyperlipidemia: Secondary | ICD-10-CM

## 2020-01-10 DIAGNOSIS — M1A0720 Idiopathic chronic gout, left ankle and foot, without tophus (tophi): Secondary | ICD-10-CM

## 2020-01-10 DIAGNOSIS — Z131 Encounter for screening for diabetes mellitus: Secondary | ICD-10-CM | POA: Diagnosis not present

## 2020-01-10 DIAGNOSIS — I10 Essential (primary) hypertension: Secondary | ICD-10-CM | POA: Diagnosis not present

## 2020-01-10 DIAGNOSIS — R519 Headache, unspecified: Secondary | ICD-10-CM

## 2020-01-10 LAB — COMPREHENSIVE METABOLIC PANEL
ALT: 29 U/L (ref 0–53)
AST: 18 U/L (ref 0–37)
Albumin: 4.2 g/dL (ref 3.5–5.2)
Alkaline Phosphatase: 65 U/L (ref 39–117)
BUN: 17 mg/dL (ref 6–23)
CO2: 30 mEq/L (ref 19–32)
Calcium: 9.2 mg/dL (ref 8.4–10.5)
Chloride: 103 mEq/L (ref 96–112)
Creatinine, Ser: 1.26 mg/dL (ref 0.40–1.50)
GFR: 69.92 mL/min (ref >60.00)
Glucose, Bld: 82 mg/dL (ref 70–99)
Potassium: 4.2 mEq/L (ref 3.5–5.1)
Sodium: 138 mEq/L (ref 135–145)
Total Bilirubin: 0.8 mg/dL (ref 0.2–1.2)
Total Protein: 6.8 g/dL (ref 6.0–8.3)

## 2020-01-10 LAB — LIPID PANEL
Cholesterol: 141 mg/dL (ref 0–200)
HDL: 53.3 mg/dL (ref >39.00)
NonHDL: 87.43
Total CHOL/HDL Ratio: 3
Triglycerides: 247 mg/dL — ABNORMAL HIGH (ref 0.0–149.0)
VLDL: 49.4 mg/dL — ABNORMAL HIGH (ref 0.0–40.0)

## 2020-01-10 LAB — HEMOGLOBIN A1C: Hgb A1c MFr Bld: 5.7 % (ref 4.6–6.5)

## 2020-01-10 LAB — LDL CHOLESTEROL, DIRECT: Direct LDL: 56 mg/dL

## 2020-01-10 NOTE — Assessment & Plan Note (Signed)
BP low and HR low. Pt not taking amlodipine. Will discuss trial off Metoprolol succinate and home monitoring. May consider restarting amlodipine depending on HR/BP response.

## 2020-01-10 NOTE — Patient Instructions (Addendum)
#  Hypertension - Verify which medications you are taking for blood pressure - MyChart - with what you are taking - anticipate that we may stop the Metoprolol  - monitor for fast heart rate or high blood pressure - Ideal Blood pressure 120/80  If blood pressure trending >130/85 then could consider restarting medication  1 year for annual or sooner if symptoms do not improve

## 2020-01-10 NOTE — Progress Notes (Signed)
Subjective:     Alex Castaneda is a 64 y.o. male presenting for Follow-up (6 month- hypertension  * had covid vaccine, will send me info )     HPI  #weight loss - was on antibiotic w/o diarrhea - just finished antibiotic yesterday - feeling tired and nauseous - was also on steroids  #chronic gout - taking allopurinol which is helpful  #HTN - only taking metoprolol once daily   Review of Systems  07/12/2019: HTN - mildly elevated. Gout - discussed stopping alluprinol (but seems he did not)  Social History   Tobacco Use  Smoking Status Former Smoker  . Packs/day: 1.00  . Years: 18.00  . Pack years: 18.00  . Types: Cigarettes  . Quit date: 07/11/1989  . Years since quitting: 30.5  Smokeless Tobacco Never Used        Objective:    BP Readings from Last 3 Encounters:  01/10/20 110/70  11/30/19 122/78  11/10/19 130/82   Wt Readings from Last 3 Encounters:  01/10/20 (!) 212 lb (96.2 kg)  11/30/19 213 lb (96.6 kg)  11/10/19 217 lb 8 oz (98.7 kg)    BP 110/70   Pulse 54   Temp 98 F (36.7 C) (Temporal)   Ht 5\' 7"  (1.702 m)   Wt (!) 212 lb (96.2 kg)   SpO2 99%   BMI 33.20 kg/m    Physical Exam Constitutional:      Appearance: Normal appearance. He is not ill-appearing or diaphoretic.  HENT:     Right Ear: External ear normal.     Left Ear: External ear normal.  Eyes:     General: No scleral icterus.    Extraocular Movements: Extraocular movements intact.     Conjunctiva/sclera: Conjunctivae normal.  Cardiovascular:     Rate and Rhythm: Normal rate and regular rhythm.     Heart sounds: No murmur heard.   Pulmonary:     Effort: Pulmonary effort is normal. No respiratory distress.     Breath sounds: Normal breath sounds. No wheezing.  Musculoskeletal:     Cervical back: Neck supple.  Skin:    General: Skin is warm and dry.  Neurological:     Mental Status: He is alert. Mental status is at baseline.  Psychiatric:        Mood and Affect: Mood  normal.           Assessment & Plan:   Problem List Items Addressed This Visit      Cardiovascular and Mediastinum   Hypertension - Primary    BP low and HR low. Pt not taking amlodipine. Will discuss trial off Metoprolol succinate and home monitoring. May consider restarting amlodipine depending on HR/BP response.       Relevant Medications   metoprolol succinate (TOPROL-XL) 50 MG 24 hr tablet   Other Relevant Orders   Comprehensive metabolic panel     Other   Chronic gout    Stable on allopurinol. Recent uric acid normal.       Hyperlipidemia    Previous LDL <100. Will recheck today.       Relevant Medications   metoprolol succinate (TOPROL-XL) 50 MG 24 hr tablet   Other Relevant Orders   Lipid panel   Sinus headache    Notes weight loss, fatigue, overall not feeling well since being on treatment. Pt will follow-up if these symptoms do not improve after he completes treatment.       Relevant Medications   metoprolol succinate (  TOPROL-XL) 50 MG 24 hr tablet    Other Visit Diagnoses    Screening for diabetes mellitus       Relevant Orders   Hemoglobin A1c   Encounter for hepatitis C screening test for low risk patient       Relevant Orders   Hepatitis C antibody   Screening for HIV (human immunodeficiency virus)       Relevant Orders   HIV Antibody (routine testing w rflx)        Return in about 1 year (around 01/09/2021) for annual.  Lesleigh Noe, MD  This visit occurred during the SARS-CoV-2 public health emergency.  Safety protocols were in place, including screening questions prior to the visit, additional usage of staff PPE, and extensive cleaning of exam room while observing appropriate contact time as indicated for disinfecting solutions.

## 2020-01-10 NOTE — Assessment & Plan Note (Signed)
Previous LDL <100. Will recheck today.

## 2020-01-10 NOTE — Assessment & Plan Note (Signed)
Notes weight loss, fatigue, overall not feeling well since being on treatment. Pt will follow-up if these symptoms do not improve after he completes treatment.

## 2020-01-10 NOTE — Assessment & Plan Note (Signed)
Stable on allopurinol. Recent uric acid normal.

## 2020-01-11 ENCOUNTER — Other Ambulatory Visit: Payer: Self-pay | Admitting: Family Medicine

## 2020-01-11 LAB — HIV ANTIBODY (ROUTINE TESTING W REFLEX): HIV 1&2 Ab, 4th Generation: NONREACTIVE

## 2020-01-11 LAB — HEPATITIS C ANTIBODY
Hepatitis C Ab: NONREACTIVE
SIGNAL TO CUT-OFF: 0.01 (ref <1.00)

## 2020-01-12 DIAGNOSIS — R5381 Other malaise: Secondary | ICD-10-CM | POA: Diagnosis not present

## 2020-01-12 DIAGNOSIS — J329 Chronic sinusitis, unspecified: Secondary | ICD-10-CM | POA: Diagnosis not present

## 2020-01-12 DIAGNOSIS — R42 Dizziness and giddiness: Secondary | ICD-10-CM | POA: Diagnosis not present

## 2020-01-12 DIAGNOSIS — R519 Headache, unspecified: Secondary | ICD-10-CM | POA: Diagnosis not present

## 2020-01-13 ENCOUNTER — Encounter: Payer: Self-pay | Admitting: Family Medicine

## 2020-01-13 DIAGNOSIS — G4452 New daily persistent headache (NDPH): Secondary | ICD-10-CM

## 2020-01-20 ENCOUNTER — Other Ambulatory Visit: Payer: Self-pay | Admitting: Family Medicine

## 2020-02-01 ENCOUNTER — Encounter: Payer: Self-pay | Admitting: Family Medicine

## 2020-02-12 ENCOUNTER — Other Ambulatory Visit: Payer: Self-pay | Admitting: Family Medicine

## 2020-02-12 ENCOUNTER — Encounter: Payer: Self-pay | Admitting: Family Medicine

## 2020-02-15 ENCOUNTER — Ambulatory Visit: Payer: Federal, State, Local not specified - PPO | Admitting: Podiatry

## 2020-02-22 ENCOUNTER — Encounter: Payer: Self-pay | Admitting: Family Medicine

## 2020-02-22 ENCOUNTER — Ambulatory Visit (INDEPENDENT_AMBULATORY_CARE_PROVIDER_SITE_OTHER): Payer: Federal, State, Local not specified - PPO | Admitting: Family Medicine

## 2020-02-22 ENCOUNTER — Other Ambulatory Visit: Payer: Self-pay

## 2020-02-22 VITALS — BP 142/80 | HR 67 | Temp 97.3°F | Ht 67.0 in | Wt 218.8 lb

## 2020-02-22 DIAGNOSIS — S46812A Strain of other muscles, fascia and tendons at shoulder and upper arm level, left arm, initial encounter: Secondary | ICD-10-CM | POA: Diagnosis not present

## 2020-02-22 DIAGNOSIS — I10 Essential (primary) hypertension: Secondary | ICD-10-CM | POA: Diagnosis not present

## 2020-02-22 DIAGNOSIS — Z23 Encounter for immunization: Secondary | ICD-10-CM | POA: Diagnosis not present
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
## 2020-02-22 NOTE — Assessment & Plan Note (Signed)
Strength intact, suspect strain. Home exercise routine - declined PT at this time. F/u if not improving.

## 2020-02-22 NOTE — Assessment & Plan Note (Addendum)
BP elevated. But home monitoring with good control. Cont metoprolol. Monitor for another week - if elevated will restart amlodipine as this was previously stopped.

## 2020-02-22 NOTE — Patient Instructions (Signed)
Shoulder - rotator cuff injury - see hand out - if an exercise causes pain do not do, muscle fatigue is OK  Blood pressure - continue home monitoring - follow-up next week if blood pressure continues to be high - will likely restart amlodipine

## 2020-02-24 DIAGNOSIS — E559 Vitamin D deficiency, unspecified: Secondary | ICD-10-CM | POA: Diagnosis not present

## 2020-02-24 DIAGNOSIS — G4440 Drug-induced headache, not elsewhere classified, not intractable: Secondary | ICD-10-CM | POA: Diagnosis not present

## 2020-02-24 DIAGNOSIS — G44221 Chronic tension-type headache, intractable: Secondary | ICD-10-CM | POA: Diagnosis not present

## 2020-02-24 DIAGNOSIS — E538 Deficiency of other specified B group vitamins: Secondary | ICD-10-CM | POA: Diagnosis not present

## 2020-02-24 DIAGNOSIS — G4452 New daily persistent headache (NDPH): Secondary | ICD-10-CM | POA: Diagnosis not present

## 2020-02-25 ENCOUNTER — Ambulatory Visit: Payer: Federal, State, Local not specified - PPO | Admitting: Podiatry

## 2020-02-29 ENCOUNTER — Other Ambulatory Visit: Payer: Self-pay

## 2020-02-29 ENCOUNTER — Ambulatory Visit (INDEPENDENT_AMBULATORY_CARE_PROVIDER_SITE_OTHER): Payer: Federal, State, Local not specified - PPO

## 2020-02-29 ENCOUNTER — Encounter: Payer: Self-pay | Admitting: Podiatry

## 2020-02-29 ENCOUNTER — Ambulatory Visit: Payer: Federal, State, Local not specified - PPO | Admitting: Podiatry

## 2020-02-29 DIAGNOSIS — M778 Other enthesopathies, not elsewhere classified: Secondary | ICD-10-CM

## 2020-02-29 DIAGNOSIS — S93602A Unspecified sprain of left foot, initial encounter: Secondary | ICD-10-CM

## 2020-02-29 MED ORDER — NAPROXEN 500 MG PO TABS: 500.0000 mg | ORAL_TABLET | Freq: Two times a day (BID) | ORAL | 3 refills | Status: DC
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
## 2020-03-02 ENCOUNTER — Ambulatory Visit: Payer: Federal, State, Local not specified - PPO | Admitting: Family Medicine

## 2020-03-02 ENCOUNTER — Other Ambulatory Visit: Payer: Self-pay

## 2020-03-02 ENCOUNTER — Encounter: Payer: Self-pay | Admitting: Family Medicine

## 2020-03-02 VITALS — BP 130/78 | HR 52 | Temp 97.4°F | Ht 67.0 in | Wt 217.5 lb

## 2020-03-02 DIAGNOSIS — R21 Rash and other nonspecific skin eruption: Secondary | ICD-10-CM | POA: Diagnosis not present

## 2020-03-02 MED ORDER — TRIAMCINOLONE ACETONIDE 0.1 % EX CREA: 1.0000 "application " | TOPICAL_CREAM | Freq: Two times a day (BID) | CUTANEOUS | 0 refills | Status: DC

## 2020-03-02 NOTE — Progress Notes (Signed)
   Subjective:     Alex Castaneda is a 64 y.o. male presenting for Rash (face, neck and upper back x 4 weeks )     HPI  #Rash - on face, neck, back - started 4 weeks ago - tried cortisone cream - acne gel - no improvement - not itchy -   Review of Systems   Social History   Tobacco Use  Smoking Status Former Smoker  . Packs/day: 1.00  . Years: 18.00  . Pack years: 18.00  . Types: Cigarettes  . Quit date: 07/11/1989  . Years since quitting: 30.6  Smokeless Tobacco Never Used        Objective:    BP Readings from Last 3 Encounters:  03/02/20 130/78  02/22/20 (!) 142/80  01/10/20 110/70   Wt Readings from Last 3 Encounters:  03/02/20 217 lb 8 oz (98.7 kg)  02/22/20 218 lb 12 oz (99.2 kg)  01/10/20 (!) 212 lb (96.2 kg)    BP 130/78   Pulse (!) 52   Temp (!) 97.4 F (36.3 C) (Temporal)   Ht 5\' 7"  (1.702 m)   Wt 217 lb 8 oz (98.7 kg)   SpO2 98%   BMI 34.07 kg/m    Physical Exam Constitutional:      Appearance: Normal appearance. He is not ill-appearing or diaphoretic.  HENT:     Right Ear: External ear normal.     Left Ear: External ear normal.     Nose: Nose normal.  Eyes:     General: No scleral icterus.    Extraocular Movements: Extraocular movements intact.     Conjunctiva/sclera: Conjunctivae normal.  Cardiovascular:     Rate and Rhythm: Normal rate.  Pulmonary:     Effort: Pulmonary effort is normal.  Musculoskeletal:     Cervical back: Neck supple.  Skin:    General: Skin is warm and dry.     Comments: Flesh colored papules 2-3 mm in size over the forehead, behind the right ear and on the back. No erythema  Neurological:     Mental Status: He is alert. Mental status is at baseline.  Psychiatric:        Mood and Affect: Mood normal.           Assessment & Plan:   Problem List Items Addressed This Visit      Musculoskeletal and Integument   Rash - Primary    Papular rash but without pruritis. Discussed options of trial of  steroid cream vs derm referral for diagnostic support. Will try higher dose steroid. ?Milia though less common in adults. Not painful. Also discussed if no improvement with steroid could try anti-fungal, but will plan derm referral.       Relevant Medications   triamcinolone cream (KENALOG) 0.1 %       Return if symptoms worsen or fail to improve.  Lesleigh Noe, MD  This visit occurred during the SARS-CoV-2 public health emergency.  Safety protocols were in place, including screening questions prior to the visit, additional usage of staff PPE, and extensive cleaning of exam room while observing appropriate contact time as indicated for disinfecting solutions.

## 2020-03-02 NOTE — Patient Instructions (Signed)
#   Rash - may be millia - which should go away on its own - try the new steroid cream - twice daily - if no improvement in 2 weeks call and will refer to Dermatology - Could do course Lamisil while waiting to see Dermatology

## 2020-03-02 NOTE — Assessment & Plan Note (Signed)
Papular rash but without pruritis. Discussed options of trial of steroid cream vs derm referral for diagnostic support. Will try higher dose steroid. ?Milia though less common in adults. Not painful. Also discussed if no improvement with steroid could try anti-fungal, but will plan derm referral.

## 2020-03-03 ENCOUNTER — Telehealth: Payer: Self-pay

## 2020-03-03 NOTE — Telephone Encounter (Signed)
Patient called and said that he mentions athletes feet to you at the end of his visit and you were going to send in an oral medication along with a cream. Your not doesn't mention anything about tinea pedis.  Please advise

## 2020-03-06 MED ORDER — TERBINAFINE HCL 250 MG PO TABS: 250.0000 mg | ORAL_TABLET | Freq: Every day | ORAL | 1 refills | Status: DC

## 2020-03-06 MED ORDER — CLOTRIMAZOLE-BETAMETHASONE 1-0.05 % EX CREA: 1.0000 "application " | TOPICAL_CREAM | Freq: Two times a day (BID) | CUTANEOUS | 1 refills | Status: DC

## 2020-03-06 NOTE — Telephone Encounter (Signed)
Will you please send in Lamisil 250mg  #28 daily and Lotrisone cream 45g with 1 refill? Thanks, Dr. Amalia Hailey

## 2020-03-06 NOTE — Addendum Note (Signed)
Addended by: Graceann Congress D on: 03/06/2020 05:59 PM   Modules accepted: Orders

## 2020-03-06 NOTE — Telephone Encounter (Signed)
Medications have been sent into pharmacy and patient has been notified via voice mail

## 2020-03-07 ENCOUNTER — Encounter: Payer: Self-pay | Admitting: Family Medicine

## 2020-03-07 DIAGNOSIS — R21 Rash and other nonspecific skin eruption: Secondary | ICD-10-CM

## 2020-03-09 DIAGNOSIS — E538 Deficiency of other specified B group vitamins: Secondary | ICD-10-CM | POA: Diagnosis not present

## 2020-03-13 ENCOUNTER — Encounter: Payer: Self-pay | Admitting: Dermatology

## 2020-03-13 ENCOUNTER — Other Ambulatory Visit: Payer: Self-pay

## 2020-03-13 ENCOUNTER — Ambulatory Visit: Payer: Federal, State, Local not specified - PPO | Admitting: Dermatology

## 2020-03-13 DIAGNOSIS — R21 Rash and other nonspecific skin eruption: Secondary | ICD-10-CM

## 2020-03-13 MED ORDER — CLINDAMYCIN PHOS-BENZOYL PEROX 1-5 % EX GEL: Freq: Two times a day (BID) | CUTANEOUS | 1 refills | Status: DC

## 2020-03-13 NOTE — Progress Notes (Signed)
   New Patient Visit  Subjective  Alex Castaneda is a 64 y.o. male who presents for the following: Rash. Patient with rash at face, neck, back and upper chest for about 10 weeks. Rash with bumps and has started to itch. Primary Care gave patient TMC 0.1% cream but it did not help. No new soaps or shampoos.   The following portions of the chart were reviewed this encounter and updated as appropriate:  Tobacco  Allergies  Meds  Problems  Med Hx  Surg Hx  Fam Hx     Review of Systems:  No other skin or systemic complaints except as noted in HPI or Assessment and Plan.  Objective  Well appearing patient in no apparent distress; mood and affect are within normal limits.  A focused examination was performed including face, neck, chest and back. Relevant physical exam findings are noted in the Assessment and Plan.  Objective  face, trunk: 1-39mm pink papules at the chest, similar flesh colored papules at forehead  Images         Assessment & Plan  Rash - sebaceous hyperplasia with folliculitis face, trunk /Folliculitis No hormone changes, but has been trying some new meds for headaches (might be related).  Start BenzaClin to affected areas twice daily. Recommend Cln acne wash.  Benzoyl peroxide can cause dryness and irritation of the skin. It can also bleach fabric. When used together with Aczone (dapsone) cream, it can stain the skin orange.  If not improving consider biopsy.   Ordered Medications: clindamycin-benzoyl peroxide (BENZACLIN WITH PUMP) gel  Return in about 1 month (around 06/25/3233) for folliculitis follow up.  Graciella Belton, RMA, am acting as scribe for Sarina Ser, MD . Documentation: I have reviewed the above documentation for accuracy and completeness, and I agree with the above.  Sarina Ser, MD ;

## 2020-03-13 NOTE — Patient Instructions (Addendum)
Benzoyl peroxide can cause dryness and irritation of the skin. It can also bleach fabric. When used together with Aczone (dapsone) cream, it can stain the skin orange.  Recommend Cln Acne Wash.

## 2020-03-15 ENCOUNTER — Ambulatory Visit: Payer: Federal, State, Local not specified - PPO | Admitting: Neurology

## 2020-03-18 ENCOUNTER — Encounter: Payer: Self-pay | Admitting: Family Medicine

## 2020-03-18 DIAGNOSIS — G500 Trigeminal neuralgia: Secondary | ICD-10-CM

## 2020-03-20 DIAGNOSIS — E538 Deficiency of other specified B group vitamins: Secondary | ICD-10-CM | POA: Diagnosis not present

## 2020-03-20 MED ORDER — OXCARBAZEPINE 300 MG PO TABS: 300.0000 mg | ORAL_TABLET | Freq: Two times a day (BID) | ORAL | 0 refills | Status: DC

## 2020-03-21 ENCOUNTER — Other Ambulatory Visit: Payer: Self-pay | Admitting: Neurology

## 2020-03-21 DIAGNOSIS — G4452 New daily persistent headache (NDPH): Secondary | ICD-10-CM

## 2020-03-27 DIAGNOSIS — E538 Deficiency of other specified B group vitamins: Secondary | ICD-10-CM | POA: Diagnosis not present

## 2020-03-31 ENCOUNTER — Ambulatory Visit: Payer: Federal, State, Local not specified - PPO | Admitting: Podiatry

## 2020-03-31 ENCOUNTER — Other Ambulatory Visit: Payer: Self-pay

## 2020-03-31 ENCOUNTER — Encounter: Payer: Self-pay | Admitting: Podiatry

## 2020-03-31 DIAGNOSIS — B353 Tinea pedis: Secondary | ICD-10-CM | POA: Diagnosis not present

## 2020-03-31 DIAGNOSIS — S93602A Unspecified sprain of left foot, initial encounter: Secondary | ICD-10-CM | POA: Diagnosis not present
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
## 2020-04-05 ENCOUNTER — Other Ambulatory Visit: Payer: Self-pay

## 2020-04-05 ENCOUNTER — Ambulatory Visit
Admission: RE | Admit: 2020-04-05 | Discharge: 2020-04-05 | Disposition: A | Discharge status: Home / Self Care | Payer: Federal, State, Local not specified - PPO | Source: Ambulatory Visit | Attending: Neurology | Admitting: Neurology

## 2020-04-05 DIAGNOSIS — R9082 White matter disease, unspecified: Secondary | ICD-10-CM | POA: Diagnosis not present

## 2020-04-05 DIAGNOSIS — G4452 New daily persistent headache (NDPH): Secondary | ICD-10-CM | POA: Diagnosis not present

## 2020-04-05 DIAGNOSIS — J3489 Other specified disorders of nose and nasal sinuses: Secondary | ICD-10-CM | POA: Diagnosis not present

## 2020-04-05 DIAGNOSIS — R519 Headache, unspecified: Secondary | ICD-10-CM | POA: Diagnosis not present

## 2020-04-05 MED ORDER — GADOBUTROL 1 MMOL/ML IV SOLN
9.0000 mL | Freq: Once | INTRAVENOUS | Status: AC | PRN
  Administered 2020-04-05: 9 mL via INTRAVENOUS

## 2020-04-17 DIAGNOSIS — E538 Deficiency of other specified B group vitamins: Secondary | ICD-10-CM | POA: Diagnosis not present

## 2020-04-20 ENCOUNTER — Other Ambulatory Visit: Payer: Self-pay

## 2020-04-20 ENCOUNTER — Ambulatory Visit: Payer: Federal, State, Local not specified - PPO | Admitting: Dermatology

## 2020-04-20 DIAGNOSIS — D229 Melanocytic nevi, unspecified: Secondary | ICD-10-CM | POA: Diagnosis not present

## 2020-04-20 DIAGNOSIS — L739 Follicular disorder, unspecified: Secondary | ICD-10-CM

## 2020-04-20 DIAGNOSIS — L821 Other seborrheic keratosis: Secondary | ICD-10-CM

## 2020-04-20 MED ORDER — CLINDAMYCIN PHOS-BENZOYL PEROX 1-5 % EX GEL: Freq: Two times a day (BID) | CUTANEOUS | 11 refills | Status: DC

## 2020-04-20 NOTE — Progress Notes (Signed)
   Follow-Up Visit   Subjective  Alex Castaneda is a 64 y.o. male who presents for the following: Sebaceous hyperplasia with folliculitis (face, trunk, 49m f/u, improved, Benzaclin gel prn).  The following portions of the chart were reviewed this encounter and updated as appropriate:  Tobacco  Allergies  Meds  Problems  Med Hx  Surg Hx  Fam Hx     Review of Systems:  No other skin or systemic complaints except as noted in HPI or Assessment and Plan.  Objective  Well appearing patient in no apparent distress; mood and affect are within normal limits.  A focused examination was performed including face, trunk. Relevant physical exam findings are noted in the Assessment and Plan.  Objective  Head - Anterior (Face): Face and trunk clear   Assessment & Plan    Seborrheic Keratoses - Stuck-on, waxy, tan-brown papules and plaques  - Discussed benign etiology and prognosis. - Observe - Call for any changes  Melanocytic Nevi - Tan-brown and/or pink-flesh-colored symmetric macules and papules - Benign appearing on exam today - Observation - Call clinic for new or changing moles - Recommend daily use of broad spectrum spf 30+ sunscreen to sun-exposed areas.   Folliculitis Head - Anterior (Face) Chronic/ improved Improved/ (sebaceous hyperplasia is clear today; folliculitis is improved today)  Cont Benzaclin gel ~2x/wk for maintenance, if flares can increase to bid  clindamycin-benzoyl peroxide (BENZACLIN) gel - Head - Anterior (Face)  Return in about 1 year (around 46/0/4799) for folliculitis.  I, Alex Castaneda, RMA, am acting as scribe for Sarina Ser, MD .  Documentation: I have reviewed the above documentation for accuracy and completeness, and I agree with the above.  Sarina Ser, MD

## 2020-04-23 ENCOUNTER — Other Ambulatory Visit: Payer: Self-pay | Admitting: Family Medicine

## 2020-04-24 ENCOUNTER — Encounter: Payer: Self-pay | Admitting: Dermatology

## 2020-05-12 ENCOUNTER — Other Ambulatory Visit: Payer: Self-pay | Admitting: Family Medicine

## 2020-05-15 DIAGNOSIS — E538 Deficiency of other specified B group vitamins: Secondary | ICD-10-CM | POA: Diagnosis not present

## 2020-05-19 ENCOUNTER — Other Ambulatory Visit: Payer: Self-pay

## 2020-05-19 ENCOUNTER — Ambulatory Visit: Payer: Federal, State, Local not specified - PPO | Admitting: Podiatry

## 2020-05-19 ENCOUNTER — Encounter: Payer: Self-pay | Admitting: Podiatry

## 2020-05-19 DIAGNOSIS — M778 Other enthesopathies, not elsewhere classified: Secondary | ICD-10-CM | POA: Diagnosis not present

## 2020-05-20 DIAGNOSIS — M778 Other enthesopathies, not elsewhere classified: Secondary | ICD-10-CM | POA: Diagnosis not present

## 2020-05-20 MED ORDER — BETAMETHASONE SOD PHOS & ACET 6 (3-3) MG/ML IJ SUSP
3.0000 mg | Freq: Once | INTRAMUSCULAR | Status: AC
  Administered 2020-05-20: 3 mg via INTRAMUSCULAR
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
## 2020-06-12 DIAGNOSIS — E538 Deficiency of other specified B group vitamins: Secondary | ICD-10-CM | POA: Diagnosis not present

## 2020-06-15 ENCOUNTER — Other Ambulatory Visit: Payer: Self-pay

## 2020-06-15 ENCOUNTER — Encounter: Payer: Self-pay | Admitting: Family Medicine

## 2020-06-15 ENCOUNTER — Ambulatory Visit: Payer: Federal, State, Local not specified - PPO | Admitting: Family Medicine

## 2020-06-15 VITALS — BP 136/80 | HR 51 | Temp 98.4°F | Ht 67.0 in | Wt 218.0 lb

## 2020-06-15 DIAGNOSIS — G5793 Unspecified mononeuropathy of bilateral lower limbs: Secondary | ICD-10-CM | POA: Diagnosis not present

## 2020-06-15 DIAGNOSIS — M79605 Pain in left leg: Secondary | ICD-10-CM | POA: Insufficient documentation

## 2020-06-15 LAB — HEMOGLOBIN A1C: Hgb A1c MFr Bld: 5.8 % (ref 4.6–6.5)

## 2020-06-15 LAB — VITAMIN B12: Vitamin B-12: 1290 pg/mL — ABNORMAL HIGH (ref 211–911)

## 2020-06-15 LAB — COMPREHENSIVE METABOLIC PANEL
ALT: 31 U/L (ref 0–53)
AST: 23 U/L (ref 0–37)
Albumin: 4.6 g/dL (ref 3.5–5.2)
Alkaline Phosphatase: 66 U/L (ref 39–117)
BUN: 8 mg/dL (ref 6–23)
CO2: 30 mEq/L (ref 19–32)
Calcium: 9.7 mg/dL (ref 8.4–10.5)
Chloride: 107 mEq/L (ref 96–112)
Creatinine, Ser: 1.03 mg/dL (ref 0.40–1.50)
GFR: 77.3 mL/min (ref >60.00)
Glucose, Bld: 111 mg/dL — ABNORMAL HIGH (ref 70–99)
Potassium: 3.8 mEq/L (ref 3.5–5.1)
Sodium: 142 mEq/L (ref 135–145)
Total Bilirubin: 0.5 mg/dL (ref 0.2–1.2)
Total Protein: 6.7 g/dL (ref 6.0–8.3)

## 2020-06-15 LAB — TSH: TSH: 3.29 u[IU]/mL (ref 0.35–4.50)

## 2020-06-15 NOTE — Assessment & Plan Note (Signed)
Notes he started running ~ 3 months ago. Symptoms started 6 weeks ago. Reassuring exam as - no swelling or ttp. Suspect maybe muscle related and advised stretching and strengthening. Numbness b/l per patient so will get labs to evaluate this. Unclear if related. Advised lotion for itching sensation. He will also try his robaxin 500 mg prn to see if that improves symptoms when the spasms start.

## 2020-06-15 NOTE — Progress Notes (Signed)
Subjective:     Alex Castaneda is a 64 y.o. male presenting for Leg Pain (Pt stated that he has been having Lt leg pain for the past 6wks. The pain comes w/o warnings and sometimes he can lay dw and the pain will easy up. It itches at times and both feet are numb)     HPI   #Leg pain - started 6 weeks - comes and goes w/o warning - more severe and more symptoms - itching on the right and left side - numbness in both feet - worse on the left side - no known injury - pain typically will come on with standing - pain is like a spasm - but intense - pain in the shin area and will occasionally radiate to the knee and lower back - improves with laying down - has tried stretches - has been running 5 ks since October - started out walking and then slowly increased to running  - pain started mid November - runs ~10 miles a week - occasionally will feel the pain before a run - treats with tylenol - occurs 3-4 times a day   Review of Systems  Constitutional: Negative for chills, fever and unexpected weight change.  Respiratory: Negative for shortness of breath.      Social History   Tobacco Use  Smoking Status Former Smoker  . Packs/day: 1.00  . Years: 18.00  . Pack years: 18.00  . Types: Cigarettes  . Quit date: 07/11/1989  . Years since quitting: 30.9  Smokeless Tobacco Never Used        Objective:    BP Readings from Last 3 Encounters:  06/15/20 136/80  03/02/20 130/78  02/22/20 (!) 142/80   Wt Readings from Last 3 Encounters:  06/15/20 218 lb (98.9 kg)  03/02/20 217 lb 8 oz (98.7 kg)  02/22/20 218 lb 12 oz (99.2 kg)    BP 136/80 (BP Location: Right Arm, Patient Position: Sitting, Cuff Size: Normal)   Pulse (!) 51   Temp 98.4 F (36.9 C) (Temporal)   Ht 5\' 7"  (1.702 m)   Wt 218 lb (98.9 kg)   SpO2 99%   BMI 34.14 kg/m    Physical Exam Constitutional:      Appearance: Normal appearance. He is not ill-appearing or diaphoretic.  HENT:     Right Ear:  External ear normal.     Left Ear: External ear normal.     Nose: Nose normal.  Eyes:     General: No scleral icterus.    Extraocular Movements: Extraocular movements intact.     Conjunctiva/sclera: Conjunctivae normal.  Cardiovascular:     Rate and Rhythm: Normal rate.  Pulmonary:     Effort: Pulmonary effort is normal.  Musculoskeletal:     Cervical back: Neck supple.     Comments: Left Leg: Inspection: no swelling, bruising, erythema Palpation: No ttp along either bones  ROM: normal ankle and knee rom  Strength: normal ankle/knee strengh Negative straight leg raise  Back - no spinous or paraspinous ttp  Skin:    General: Skin is warm and dry.  Neurological:     Mental Status: He is alert. Mental status is at baseline.  Psychiatric:        Mood and Affect: Mood normal.    Diabetic Foot Exam - Simple   Simple Foot Form Diabetic Foot exam was performed with the following findings: Yes 06/15/2020  8:20 AM  Visual Inspection No deformities, no ulcerations, no other skin  breakdown bilaterally: Yes Sensation Testing See comments: Yes Pulse Check Posterior Tibialis and Dorsalis pulse intact bilaterally: Yes Comments Slightly diminished monofilament testing           Assessment & Plan:   Problem List Items Addressed This Visit      Nervous and Auditory   Neuropathy of both feet - Primary    Hx of prediabetes so will recheck this as well as other labs. Unclear if related to the leg pain as b/l. However, if worsening may consider neurology referral.       Relevant Orders   Vitamin B12   TSH   Hemoglobin A1c   Comprehensive metabolic panel     Other   Left leg pain    Notes he started running ~ 3 months ago. Symptoms started 6 weeks ago. Reassuring exam as - no swelling or ttp. Suspect maybe muscle related and advised stretching and strengthening. Numbness b/l per patient so will get labs to evaluate this. Unclear if related. Advised lotion for itching  sensation. He will also try his robaxin 500 mg prn to see if that improves symptoms when the spasms start.           Return in about 4 weeks (around 07/13/2020), or if symptoms worsen or fail to improve.  Lynnda Child, MD  This visit occurred during the SARS-CoV-2 public health emergency.  Safety protocols were in place, including screening questions prior to the visit, additional usage of staff PPE, and extensive cleaning of exam room while observing appropriate contact time as indicated for disinfecting solutions.

## 2020-06-15 NOTE — Assessment & Plan Note (Signed)
Hx of prediabetes so will recheck this as well as other labs. Unclear if related to the leg pain as b/l. However, if worsening may consider neurology referral.

## 2020-06-15 NOTE — Patient Instructions (Addendum)
Work on home stretching routine  - Calf Stretching - Calf raise - shin stretching  - consider massaging the area   Labs today to rule out things   If not improving in 4-6 weeks - come back to see Dr. Patsy Lager

## 2020-06-30 ENCOUNTER — Ambulatory Visit: Payer: Federal, State, Local not specified - PPO | Admitting: Podiatry

## 2020-06-30 ENCOUNTER — Encounter: Payer: Self-pay | Admitting: Podiatry

## 2020-06-30 ENCOUNTER — Other Ambulatory Visit: Payer: Self-pay

## 2020-06-30 DIAGNOSIS — M778 Other enthesopathies, not elsewhere classified: Secondary | ICD-10-CM

## 2020-06-30 DIAGNOSIS — S93602A Unspecified sprain of left foot, initial encounter: Secondary | ICD-10-CM
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
## 2020-07-07 ENCOUNTER — Encounter: Payer: Self-pay | Admitting: Family Medicine

## 2020-07-07 DIAGNOSIS — M795 Residual foreign body in soft tissue: Secondary | ICD-10-CM

## 2020-07-16 ENCOUNTER — Ambulatory Visit
Admission: RE | Admit: 2020-07-16 | Discharge: 2020-07-16 | Disposition: A | Discharge status: Home / Self Care | Payer: Federal, State, Local not specified - PPO | Source: Ambulatory Visit | Attending: Podiatry | Admitting: Podiatry

## 2020-07-16 ENCOUNTER — Other Ambulatory Visit: Payer: Self-pay

## 2020-07-16 DIAGNOSIS — M778 Other enthesopathies, not elsewhere classified: Secondary | ICD-10-CM

## 2020-07-16 DIAGNOSIS — G8929 Other chronic pain: Secondary | ICD-10-CM | POA: Diagnosis not present

## 2020-07-16 DIAGNOSIS — M19072 Primary osteoarthritis, left ankle and foot: Secondary | ICD-10-CM | POA: Diagnosis not present

## 2020-07-16 DIAGNOSIS — S93602A Unspecified sprain of left foot, initial encounter: Secondary | ICD-10-CM

## 2020-07-21 ENCOUNTER — Ambulatory Visit: Payer: Federal, State, Local not specified - PPO | Admitting: Podiatry

## 2020-07-21 ENCOUNTER — Encounter: Payer: Self-pay | Admitting: Podiatry

## 2020-07-21 ENCOUNTER — Other Ambulatory Visit: Payer: Self-pay | Admitting: Family Medicine

## 2020-07-21 ENCOUNTER — Other Ambulatory Visit: Payer: Self-pay

## 2020-07-21 DIAGNOSIS — S93602A Unspecified sprain of left foot, initial encounter: Secondary | ICD-10-CM

## 2020-07-21 DIAGNOSIS — M778 Other enthesopathies, not elsewhere classified: Secondary | ICD-10-CM | POA: Diagnosis not present

## 2020-07-21 MED ORDER — BETAMETHASONE SOD PHOS & ACET 6 (3-3) MG/ML IJ SUSP
3.0000 mg | Freq: Once | INTRAMUSCULAR | Status: AC
  Administered 2020-07-21: 3 mg via INTRA_ARTICULAR
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
## 2020-08-08 ENCOUNTER — Other Ambulatory Visit: Payer: Self-pay | Admitting: Family Medicine

## 2020-08-08 ENCOUNTER — Encounter: Payer: Self-pay | Admitting: Family Medicine

## 2020-08-08 DIAGNOSIS — G500 Trigeminal neuralgia: Secondary | ICD-10-CM

## 2020-08-08 NOTE — Telephone Encounter (Signed)
Requested pt measure arm. Routing to MA as [employer]

## 2020-08-14 ENCOUNTER — Encounter: Payer: Self-pay | Admitting: Dermatology

## 2020-08-18 ENCOUNTER — Encounter: Payer: Self-pay | Admitting: Podiatry

## 2020-08-18 ENCOUNTER — Other Ambulatory Visit: Payer: Self-pay

## 2020-08-18 ENCOUNTER — Ambulatory Visit: Payer: Federal, State, Local not specified - PPO | Admitting: Podiatry

## 2020-08-18 DIAGNOSIS — S93602A Unspecified sprain of left foot, initial encounter: Secondary | ICD-10-CM | POA: Diagnosis not present

## 2020-08-18 DIAGNOSIS — M778 Other enthesopathies, not elsewhere classified: Secondary | ICD-10-CM
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
## 2020-08-30 ENCOUNTER — Encounter: Payer: Self-pay | Admitting: Family Medicine

## 2020-08-31 NOTE — Telephone Encounter (Signed)
Ok to allow pt deaf step son to schedule new patient visit.   Will need sign language interpretor for visit

## 2020-09-05 NOTE — Telephone Encounter (Signed)
Viewed schedule and patient's step son scheduled.

## 2020-09-05 NOTE — Telephone Encounter (Signed)
Called patient to schedule step son visit with Dr.[name] as new patient. LVM to call back.

## 2020-09-15 ENCOUNTER — Encounter: Payer: Self-pay | Admitting: Podiatry

## 2020-09-15 ENCOUNTER — Other Ambulatory Visit: Payer: Self-pay

## 2020-09-15 ENCOUNTER — Ambulatory Visit: Payer: Federal, State, Local not specified - PPO | Admitting: Podiatry

## 2020-09-15 DIAGNOSIS — M778 Other enthesopathies, not elsewhere classified: Secondary | ICD-10-CM | POA: Diagnosis not present

## 2020-09-15 MED ORDER — BETAMETHASONE SOD PHOS & ACET 6 (3-3) MG/ML IJ SUSP
3.0000 mg | Freq: Once | INTRAMUSCULAR | Status: AC
  Administered 2020-09-15: 3 mg via INTRA_ARTICULAR
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
## 2020-10-12 ENCOUNTER — Encounter: Payer: Self-pay | Admitting: Family Medicine

## 2020-10-13 ENCOUNTER — Other Ambulatory Visit: Payer: Self-pay

## 2020-10-13 MED ORDER — METOPROLOL SUCCINATE ER 100 MG PO TB24: 100.0000 mg | ORAL_TABLET | Freq: Every day | ORAL | 1 refills | Status: DC

## 2020-10-16 ENCOUNTER — Ambulatory Visit: Payer: Federal, State, Local not specified - PPO | Admitting: Dermatology

## 2020-10-16 ENCOUNTER — Other Ambulatory Visit: Payer: Self-pay

## 2020-10-16 DIAGNOSIS — L738 Other specified follicular disorders: Secondary | ICD-10-CM

## 2020-10-16 DIAGNOSIS — L709 Acne, unspecified: Secondary | ICD-10-CM

## 2020-10-16 DIAGNOSIS — L739 Follicular disorder, unspecified: Secondary | ICD-10-CM | POA: Diagnosis not present

## 2020-10-16 NOTE — Progress Notes (Signed)
   Follow-Up Visit   Subjective  Alex Castaneda is a 65 y.o. male who presents for the following: Follow-up (Folliculitis of face and chest - recently flared. He uses benzaclin qd).  The following portions of the chart were reviewed this encounter and updated as appropriate:   Tobacco  Allergies  Meds  Problems  Med Hx  Surg Hx  Fam Hx     Review of Systems:  No other skin or systemic complaints except as noted in HPI or Assessment and Plan.  Objective  Well appearing patient in no apparent distress; mood and affect are within normal limits.  A focused examination was performed including face, chest. Relevant physical exam findings are noted in the Assessment and Plan.  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 Information given to patient today to consider treatment.  Folliculitis and acne Face, trunk With acne Chronic and persistent Discussed Isotretinoin. Information given to patient today to consider treatment.  Continue Benzaclin qd-bid  Other Related Medications clindamycin-benzoyl peroxide (BENZACLIN) gel  Return if symptoms worsen or fail to improve.   I, Ashok Cordia, CMA, am acting as scribe for Sarina Ser, MD .  Documentation: I have reviewed the above documentation for accuracy and completeness, and I agree with the above.  Sarina Ser, MD

## 2020-10-16 NOTE — Patient Instructions (Signed)

## 2020-10-19 ENCOUNTER — Encounter: Payer: Self-pay | Admitting: Dermatology

## 2020-11-04 ENCOUNTER — Other Ambulatory Visit: Payer: Self-pay | Admitting: Family Medicine

## 2020-11-10 ENCOUNTER — Other Ambulatory Visit: Payer: Self-pay | Admitting: Family Medicine

## 2020-11-13 ENCOUNTER — Encounter: Payer: Self-pay | Admitting: Family Medicine

## 2020-11-13 DIAGNOSIS — Z1211 Encounter for screening for malignant neoplasm of colon: Secondary | ICD-10-CM

## 2020-11-14 MED ORDER — AMLODIPINE BESYLATE 5 MG PO TABS: 5.0000 mg | ORAL_TABLET | Freq: Every day | ORAL | 3 refills | Status: DC

## 2020-11-16 NOTE — Addendum Note (Signed)
Addended by: Lesleigh Noe on: 11/16/2020 04:39 PM   Modules accepted: Orders

## 2020-11-20 ENCOUNTER — Telehealth: Payer: Self-pay | Admitting: *Deleted

## 2020-11-20 NOTE — Telephone Encounter (Signed)
Called and spoke to patient and was advised that his symptoms started about a week ago. Patient stated that he has had off and on abdominal pain that is a level 5-6 at times. Patient stated that he has been taking tylenol for the pain. Patient stated that he is not having any problems with urinating. Patient denies back pain, fever or body aches. Patient stated that he has felt nauseated sometimes after eating. Patient stated that he has felt real tired. Patient had a negative covid screening. Patient was offered an appointment in the morning with Dr. Einar Pheasant which he declined because he already has another appointment scheduled in the morning. Patient scheduled to see Dr. Einar Pheasant 11/22/20 at 8:20 am. Patient was given ER precautions and he verbalized understanding.

## 2020-11-20 NOTE — Telephone Encounter (Signed)
Noted agree with UC/ER precautions while waiting for appt

## 2020-11-20 NOTE — Telephone Encounter (Signed)
PLEASE NOTE: All timestamps contained within this report are represented as [place] Standard Time. CONFIDENTIALTY NOTICE: This fax transmission is intended only for the addressee. It contains information that is legally privileged, confidential or otherwise protected from use or disclosure. If you are not the intended recipient, you are strictly prohibited from reviewing, disclosing, copying using or disseminating any of this information or taking any action in reliance on or regarding this information. If you have received this fax in error, please notify us immediately by telephone so that we can arrange for its return to [place]. Phone: [phone], Toll-Free: [phone], Fax: [phone] Page: 1 of 1 Call Id: [id number] [place] Night - Client Nonclinical Telephone Record  [facility] Client [place] Night - Client Client Site [place] - Night Contact Type Call Who Is Calling Patient / Member / Family / Caregiver Caller Name Gregoire Bennis Caller Phone Number [phone] Patient Name Alex Castaneda Patient DOB 1955/12/02 Call Type Message Only Information Provided Reason for Call Request to Schedule Office Appointment Initial Comment caller has stomach/bladder pain and feeling very fatigue for the past week caller refused triage / caller asking for first available Patient request to speak to RN No Disp. Time Disposition Final User 11/20/2020 7:14:14 AM General Information Provided Yes Kenton Kingfisher, Lanette Call Closed By: Nelia Shi Transaction Date/Time: 11/20/2020 7:11:16 AM (ET)

## 2020-11-22 ENCOUNTER — Ambulatory Visit: Payer: Federal, State, Local not specified - PPO | Admitting: Family Medicine

## 2020-11-22 ENCOUNTER — Other Ambulatory Visit: Payer: Self-pay

## 2020-11-22 VITALS — BP 118/78 | HR 53 | Temp 98.2°F | Wt 215.8 lb

## 2020-11-22 DIAGNOSIS — S90129A Contusion of unspecified lesser toe(s) without damage to nail, initial encounter: Secondary | ICD-10-CM | POA: Diagnosis not present

## 2020-11-22 DIAGNOSIS — R102 Pelvic and perineal pain: Secondary | ICD-10-CM

## 2020-11-22 NOTE — Progress Notes (Signed)
Subjective:     Alex Castaneda is a 65 y.o. male presenting for Abdominal Pain (Center, inguinal area, On and off x 1 week )     Abdominal Pain This is a new problem. The current episode started in the past 7 days. The problem has been gradually improving. The pain is located in the suprapubic region. The quality of the pain is aching (spasm). Associated symptoms include nausea. Pertinent negatives include no constipation, diarrhea, dysuria, frequency, hematochezia, hematuria or vomiting. The pain is aggravated by certain positions. The pain is relieved by eating and liquids (opening legs). He has tried acetaminophen for the symptoms. The treatment provided mild relief. umbilical hernia repair   No groin or testicular pain  No strain or recent exercise change  But unable to do regular activity  Toe injury - 1 month ago - walking well - bruise present   Review of Systems  Gastrointestinal: Positive for abdominal pain and nausea. Negative for constipation, diarrhea, hematochezia and vomiting.  Genitourinary: Negative for dysuria, frequency and hematuria.     Social History   Tobacco Use  Smoking Status Former Smoker  . Packs/day: 1.00  . Years: 18.00  . Pack years: 18.00  . Types: Cigarettes  . Quit date: 07/11/1989  . Years since quitting: 31.3  Smokeless Tobacco Never Used        Objective:    BP Readings from Last 3 Encounters:  11/22/20 118/78  06/15/20 136/80  03/02/20 130/78   Wt Readings from Last 3 Encounters:  11/22/20 215 lb 12 oz (97.9 kg)  06/15/20 218 lb (98.9 kg)  03/02/20 217 lb 8 oz (98.7 kg)    BP 118/78   Pulse (!) 53   Temp 98.2 F (36.8 C) (Temporal)   Wt 215 lb 12 oz (97.9 kg)   SpO2 99%   BMI 33.79 kg/m    Physical Exam Constitutional:      Appearance: Normal appearance. He is not ill-appearing or diaphoretic.  HENT:     Right Ear: External ear normal.     Left Ear: External ear normal.     Nose: Nose normal.  Eyes:      General: No scleral icterus.    Extraocular Movements: Extraocular movements intact.     Conjunctiva/sclera: Conjunctivae normal.  Cardiovascular:     Rate and Rhythm: Normal rate and regular rhythm.     Heart sounds: No murmur heard.   Pulmonary:     Effort: Pulmonary effort is normal. No respiratory distress.     Breath sounds: Normal breath sounds. No wheezing.  Abdominal:     General: Abdomen is flat. A surgical scar is present. Bowel sounds are normal. There is no distension.     Palpations: Abdomen is soft.     Tenderness: There is no abdominal tenderness. There is no guarding or rebound.     Hernia: No hernia is present.  Musculoskeletal:     Cervical back: Neck supple.  Feet:     Comments: Bruise on right great toenail. Normal growth on lower portion  Skin:    General: Skin is warm and dry.  Neurological:     Mental Status: He is alert. Mental status is at baseline.  Psychiatric:        Mood and Affect: Mood normal.           Assessment & Plan:   Problem List Items Addressed This Visit   None   Visit Diagnoses    Suprapubic abdominal pain    -  Primary   Bruised toe         Pain already improving. Discussed watch and wait. Normal abdominal exam. Discussed monitoring for urinary or worsening symptoms and calling back.   Toe - discussed expected healing and growth. Return in not resolve in 6 months or pain.   Return in about 4 months (around 03/24/2021) for annual exam.  Lesleigh Noe, MD  This visit occurred during the SARS-CoV-2 public health emergency.  Safety protocols were in place, including screening questions prior to the visit, additional usage of staff PPE, and extensive cleaning of exam room while observing appropriate contact time as indicated for disinfecting solutions.

## 2020-11-22 NOTE — Patient Instructions (Signed)
#  Abdominal pain - continue to watch and wait - contact if new symptoms - worsening pain, swelling, diarrhea, vomiting, etc  - or if not improving

## 2020-12-14 ENCOUNTER — Telehealth: Payer: Federal, State, Local not specified - PPO | Admitting: Gastroenterology

## 2020-12-14 DIAGNOSIS — Z86010 Personal history of colonic polyps: Secondary | ICD-10-CM

## 2020-12-14 MED ORDER — PEG 3350-KCL-NA BICARB-NACL 420 G PO SOLR: 4000.0000 mL | Freq: Once | ORAL | 0 refills | Status: AC

## 2020-12-14 NOTE — Progress Notes (Signed)
Gastroenterology Pre-Procedure Review  Request Date: 01/01/2021 Requesting Physician: Dr. Bonna Gains  PATIENT REVIEW QUESTIONS: The patient responded to the following health history questions as indicated:    1. Are you having any GI issues? no 2. Do you have a personal history of Polyps? yes (Colonoscopy years ago ) 3. Do you have a family history of Colon Cancer or Polyps? no 4. Diabetes Mellitus? no 5. Joint replacements in the past 12 months?no 6. Major health problems in the past 3 months?no 7. Any artificial heart valves, MVP, or defibrillator?no    MEDICATIONS & ALLERGIES:    Patient reports the following regarding taking any anticoagulation/antiplatelet therapy:   Plavix, Coumadin, Eliquis, Xarelto, Lovenox, Pradaxa, Brilinta, or Effient? no Aspirin? no  Patient confirms/reports the following medications:  Current Outpatient Medications  Medication Sig Dispense Refill   allopurinol (ZYLOPRIM) 100 MG tablet TAKE 1 TABLET(100 MG) BY MOUTH DAILY 90 tablet 2   amLODipine (NORVASC) 5 MG tablet Take 1 tablet (5 mg total) by mouth daily. 90 tablet 3   clindamycin-benzoyl peroxide (BENZACLIN) gel Apply topically 2 (two) times daily. Bid to aa face, trunk 50 g 11   colchicine 0.6 MG tablet TAKE 1 TABLET BY MOUTH DAILY 90 tablet 1   metoprolol succinate (TOPROL-XL) 100 MG 24 hr tablet Take 1 tablet (100 mg total) by mouth daily. Take with or immediately following a meal. 90 tablet 1   naproxen (NAPROSYN) 500 MG tablet Take 1 tablet (500 mg total) by mouth 2 (two) times daily with a meal. 60 tablet 3   Oxcarbazepine (TRILEPTAL) 300 MG tablet TAKE 1 TABLET(300 MG) BY MOUTH TWICE DAILY 60 tablet 1   No current facility-administered medications for this visit.    Patient confirms/reports the following allergies:  Allergies  Allergen Reactions   Naproxen     Other reaction(s): Unknown to Patient//Family   Nsaids     Other reaction(s): Unknown to Patient//Family   Salicylates      Other reaction(s): Unknown to Patient//Family    No orders of the defined types were placed in this encounter.   AUTHORIZATION INFORMATION Primary Insurance: 1D#: Group #:  Secondary Insurance: 1D#: Group #:  SCHEDULE INFORMATION: Date: 01/01/2021  Time: Location: [place]

## 2021-01-01 ENCOUNTER — Encounter
Admission: RE | Disposition: A | Discharge status: Home / Self Care | Payer: Self-pay | Source: Home / Self Care | Attending: Gastroenterology

## 2021-01-01 ENCOUNTER — Encounter: Payer: Self-pay | Admitting: Gastroenterology

## 2021-01-01 ENCOUNTER — Ambulatory Visit
Admission: RE | Admit: 2021-01-01 | Discharge: 2021-01-01 | Disposition: A | Discharge status: Home / Self Care | Payer: Federal, State, Local not specified - PPO | Attending: Gastroenterology | Admitting: Gastroenterology

## 2021-01-01 ENCOUNTER — Ambulatory Visit: Payer: Federal, State, Local not specified - PPO | Admitting: Anesthesiology

## 2021-01-01 DIAGNOSIS — K648 Other hemorrhoids: Secondary | ICD-10-CM | POA: Diagnosis not present

## 2021-01-01 DIAGNOSIS — Z860100 Personal history of colon polyps, unspecified: Secondary | ICD-10-CM

## 2021-01-01 DIAGNOSIS — K635 Polyp of colon: Secondary | ICD-10-CM | POA: Diagnosis not present

## 2021-01-01 DIAGNOSIS — Z87891 Personal history of nicotine dependence: Secondary | ICD-10-CM | POA: Insufficient documentation

## 2021-01-01 DIAGNOSIS — Z1211 Encounter for screening for malignant neoplasm of colon: Secondary | ICD-10-CM | POA: Insufficient documentation

## 2021-01-01 HISTORY — PX: COLONOSCOPY WITH PROPOFOL: SHX5780

## 2021-01-01 SURGERY — COLONOSCOPY WITH PROPOFOL
Anesthesia: General

## 2021-01-01 MED ORDER — PROPOFOL 10 MG/ML IV BOLUS
INTRAVENOUS | Status: DC | PRN
  Administered 2021-01-01: 70 mg via INTRAVENOUS

## 2021-01-01 MED ORDER — SODIUM CHLORIDE 0.9 % IV SOLN: INTRAVENOUS | Status: DC

## 2021-01-01 MED ORDER — PROPOFOL 500 MG/50ML IV EMUL
INTRAVENOUS | Status: DC | PRN
  Administered 2021-01-01: 140 ug/kg/min via INTRAVENOUS

## 2021-01-01 NOTE — Anesthesia Postprocedure Evaluation (Signed)
Anesthesia Post Note  Patient: Alex Castaneda  Procedure(s) Performed: COLONOSCOPY WITH PROPOFOL  Patient location during evaluation: PACU Anesthesia Type: General Level of consciousness: awake and alert Pain management: pain level controlled Vital Signs Assessment: post-procedure vital signs reviewed and stable Respiratory status: spontaneous breathing, nonlabored ventilation, respiratory function stable and patient connected to nasal cannula oxygen Cardiovascular status: blood pressure returned to baseline and stable Postop Assessment: no apparent nausea or vomiting Anesthetic complications: no   No notable events documented.   Last Vitals:  Vitals:   01/01/21 1231 01/01/21 1241  BP: 120/81 (!) 126/96  Pulse: 61 (!) 45  Resp: 17 15  Temp:    SpO2: 96% 100%    Last Pain:  Vitals:   01/01/21 1241  TempSrc:   PainSc: 0-No pain                 Iran Ouch

## 2021-01-01 NOTE — H&P (Signed)
Vonda Antigua, MD [address], [place], [place], [place], [address] [address], [place], [place], [place], [address] Phone: [phone]  Fax: [phone]  Primary Care Physician:  Lesleigh Noe, MD   Pre-Procedure History & Physical: HPI:  Alex Castaneda is a 65 y.o. male is here for a colonoscopy.   Past Medical History:  Diagnosis Date   Arthritis    Colon polyps    GERD (gastroesophageal reflux disease)    Hyperlipidemia    Hypertension    Thyroid disease     Past Surgical History:  Procedure Laterality Date   ELBOW SURGERY Right    HERNIA REPAIR     Umbilical   KNEE SURGERY Left    arthroscopic   ROTATOR CUFF REPAIR Right     Prior to Admission medications   Medication Sig Start Date End Date Taking? Authorizing Provider  amLODipine (NORVASC) 5 MG tablet Take 1 tablet (5 mg total) by mouth daily. 11/14/20  Yes Lesleigh Noe, MD  metoprolol succinate (TOPROL-XL) 100 MG 24 hr tablet Take 1 tablet (100 mg total) by mouth daily. Take with or immediately following a meal. 10/13/20  Yes Lesleigh Noe, MD  allopurinol (ZYLOPRIM) 100 MG tablet TAKE 1 TABLET(100 MG) BY MOUTH DAILY 05/15/20   Lesleigh Noe, MD  clindamycin-benzoyl peroxide [facility]) gel Apply topically 2 (two) times daily. Bid to aa face, trunk 04/20/20   Ralene Bathe, MD  colchicine 0.6 MG tablet TAKE 1 TABLET BY MOUTH DAILY 07/21/20   Lesleigh Noe, MD  naproxen (NAPROSYN) 500 MG tablet Take 1 tablet (500 mg total) by mouth 2 (two) times daily with a meal. 02/29/20   Edrick Kins, DPM  Oxcarbazepine (TRILEPTAL) 300 MG tablet TAKE 1 TABLET(300 MG) BY MOUTH TWICE DAILY 08/15/20   Lesleigh Noe, MD    Allergies as of 12/14/2020 - Review Complete 12/14/2020  Allergen Reaction Noted   Naproxen  03/22/2019   Nsaids  19/37/9024   Salicylates  09/73/5329    Family History  Problem Relation Age of Onset   Throat cancer Mother        smoker   COPD Sister    Cancer Brother         unknown   Arthritis Sister    Diabetes Sister    Diabetes Sister    Stroke Brother 54       tobacco use    Social History   Socioeconomic History   Marital status: Married    Spouse name: Chrys Racer   Number of children: 2   Years of education: Associates degree   Highest education level: Not on file  Occupational History   Not on file  Tobacco Use   Smoking status: Former    Packs/day: 1.00    Years: 18.00    Pack years: 18.00    Types: Cigarettes    Quit date: 07/11/1989    Years since quitting: 31.4   Smokeless tobacco: Never  Vaping Use   Vaping Use: Never used  Substance and Sexual Activity   Alcohol use: Not Currently    Comment: 1 drink 1-2 time a month   Drug use: Never   Sexual activity: Not Currently  Other Topics Concern   Not on file  Social History Narrative   07/12/19   From: moved from [place] - but from [place] originally    Living: with wife Chrys Racer - 24 years   Work: retired from [employer]:  2 children - Tasha and Joelene Millin - live nearby ([place], [place]) - grandkids - 8 - and 2 great grandchildren      Enjoys: working on cars, walking, helping and supporting family      Exercise: walking - regularly, hard in the winter   Diet: better now      Safety   Seat belts: Yes    Guns: No   Safe in relationships: Yes    Social Determinants of Radio broadcast assistant Strain: Not on file  Food Insecurity: Not on file  Transportation Needs: Not on file  Physical Activity: Not on file  Stress: Not on file  Social Connections: Not on file  Intimate Partner Violence: Not on file    Review of Systems: See HPI, otherwise negative ROS  Physical Exam: Constitutional: General:   Alert,  Well-developed, well-nourished, pleasant and cooperative in NAD BP (!) 155/83   Pulse (!) 45   Temp (!) 97 F (36.1 C) (Temporal)   Resp 16   Ht 5\' 7"  (1.702 m)   Wt 95.3 kg   SpO2 99%   BMI 32.89 kg/m   Head: Normocephalic, atraumatic.   Eyes:   Sclera clear, no icterus.   Conjunctiva pink.   Mouth:  No deformity or lesions, oropharynx pink & moist.  Neck:  Supple, trachea midline  Respiratory: Normal respiratory effort  Gastrointestinal:  Soft, non-tender and non-distended without masses, hepatosplenomegaly or hernias noted.  No guarding or rebound tenderness.     Cardiac: No clubbing or edema.  No cyanosis. Normal posterior tibial pedal pulses noted.  Lymphatic:  No significant cervical adenopathy.  Psych:  Alert and cooperative. Normal mood and affect.  Musculoskeletal:   Symmetrical without gross deformities. 5/5 Lower extremity strength bilaterally.  Skin: Warm. Intact without significant lesions or rashes. No jaundice.  Neurologic:  Face symmetrical, tongue midline, Normal sensation to touch;  grossly normal neurologically.  Psych:  Alert and oriented x3, Alert and cooperative. Normal mood and affect.  Impression/Plan: Alex Castaneda is here for a colonoscopy to be performed for history of polyps. Pt states he has had previous colonoscopies in [facility] and was advised to get a colonoscopy every 5 years due to polyps on previous colonoscopies. [facility] records not available. Colonoscopy in 2015 was normal. See scanned report.   Risks, benefits, limitations, and alternatives regarding  colonoscopy have been reviewed with the patient.  Questions have been answered.  All parties agreeable.   Virgel Manifold, MD  01/01/2021, 11:15 AM

## 2021-01-01 NOTE — Op Note (Signed)
[facility] Patient Name: Alex Castaneda Procedure Date: 01/01/2021 11:46 AM MRN: [id number] Account #: [id number] Date of Birth: 1956/01/17 Admit Type: Outpatient Age: 65 Room: [facility] ENDO ROOM 2 Gender: Male Note Status: Finalized Procedure:             Colonoscopy Indications:           High risk colon cancer surveillance: Personal history                         of colonic polyps Providers:             Loann Chahal B. Bonna Gains MD, MD Referring MD:          Jobe Marker. Einar Pheasant (Referring MD) Medicines:             Monitored Anesthesia Care Complications:         No immediate complications. Procedure:             Pre-Anesthesia Assessment:                        - ASA Grade Assessment: II - A patient with mild                         systemic disease.                        - Prior to the procedure, a History and Physical was                         performed, and patient medications, allergies and                         sensitivities were reviewed. The patient's tolerance                         of previous anesthesia was reviewed.                        - The risks and benefits of the procedure and the                         sedation options and risks were discussed with the                         patient. All questions were answered and informed                         consent was obtained.                        - Patient identification and proposed procedure were                         verified prior to the procedure by the physician, the                         nurse, the anesthesiologist, the anesthetist and the                         technician. The procedure was  verified in the                         procedure room.                        After obtaining informed consent, the colonoscope was                         passed under direct vision. Throughout the procedure,                         the patient's blood pressure, pulse, and oxygen                          saturations were monitored continuously. The                         Colonoscope was introduced through the anus and                         advanced to the the cecum, identified by appendiceal                         orifice and ileocecal valve. The colonoscopy was                         performed with ease. The patient tolerated the                         procedure well. The quality of the bowel preparation                         was good. Findings:      The perianal and digital rectal examinations were normal.      A 6 mm polyp was found in the sigmoid colon. The polyp was flat. The       polyp was removed with a cold snare. Resection and retrieval were       complete.      The exam was otherwise without abnormality.      The rectum, sigmoid colon, descending colon, transverse colon, ascending       colon and cecum appeared normal.      Non-bleeding internal hemorrhoids were found during retroflexion. Impression:            - One 6 mm polyp in the sigmoid colon, removed with a                         cold snare. Resected and retrieved.                        - The examination was otherwise normal.                        - The rectum, sigmoid colon, descending colon,                         transverse colon, ascending colon and cecum are normal.                        -  Non-bleeding internal hemorrhoids. Recommendation:        - Obtain previous colonoscopy records from [facility]                         for review to determine interval of future                         colonoscopies as well.                        - Await pathology results.                        - Discharge patient to home (with escort).                        - Advance diet as tolerated.                        - Continue present medications.                        - Repeat colonoscopy date to be determined after                         pending pathology results are reviewed.                         - The findings and recommendations were discussed with                         the patient.                        - The findings and recommendations were discussed with                         the patient's family.                        - Return to primary care physician as previously                         scheduled.                        - High fiber diet. Procedure Code(s):     --- Professional ---                        [phone], Colonoscopy, flexible; with removal of                         tumor(s), polyp(s), or other lesion(s) by snare                         technique Diagnosis Code(s):     --- Professional ---                        Z86.010, Personal history of colonic polyps  K63.5, Polyp of colon CPT copyright 2019 American Medical Association. All rights reserved. The codes documented in this report are preliminary and upon coder review may  be revised to meet current compliance requirements.  Vonda Antigua, MD Margretta Sidle B. Bonna Gains MD, MD 01/01/2021 12:18:08 PM This report has been signed electronically. Number of Addenda: 0 Note Initiated On: 01/01/2021 11:46 AM Scope Withdrawal Time: 0 hours 16 minutes 48 seconds  Total Procedure Duration: 0 hours 19 minutes 58 seconds  Estimated Blood Loss:  Estimated blood loss: none.      [facility]

## 2021-01-01 NOTE — Anesthesia Preprocedure Evaluation (Signed)
Anesthesia Evaluation  Patient identified by MRN, date of birth, ID band Patient awake    Reviewed: Allergy & Precautions, H&P , NPO status , Patient's Chart, lab work & pertinent test results  Airway Mallampati: I  TM Distance: >3 FB Neck ROM: full    Dental no notable dental hx.    Pulmonary sleep apnea and Continuous Positive Airway Pressure Ventilation , former smoker,    Pulmonary exam normal        Cardiovascular Exercise Tolerance: Good hypertension, Normal cardiovascular exam     Neuro/Psych negative neurological ROS  negative psych ROS   GI/Hepatic Neg liver ROS, Bowel prep,  Endo/Other  negative endocrine ROS  Renal/GU negative Renal ROS  negative genitourinary   Musculoskeletal  (+) Arthritis ,   Abdominal   Peds  Hematology negative hematology ROS (+)   Anesthesia Other Findings Past Medical History: No date: Arthritis No date: Colon polyps No date: GERD (gastroesophageal reflux disease) No date: Hyperlipidemia No date: Hypertension No date: Thyroid disease  Past Surgical History: No date: ELBOW SURGERY; Right No date: HERNIA REPAIR     Comment:  Umbilical No date: KNEE SURGERY; Left     Comment:  arthroscopic No date: ROTATOR CUFF REPAIR; Right  BMI    Body Mass Index: 32.89 kg/m      Reproductive/Obstetrics negative OB ROS                             Anesthesia Physical Anesthesia Plan  ASA: 3  Anesthesia Plan: General   Post-op Pain Management:    Induction:   PONV Risk Score and Plan: Propofol infusion and TIVA  Airway Management Planned:   Additional Equipment:   Intra-op Plan:   Post-operative Plan:   Informed Consent: I have reviewed the patients History and Physical, chart, labs and discussed the procedure including the risks, benefits and alternatives for the proposed anesthesia with the patient or authorized representative who has  indicated his/her understanding and acceptance.     Dental Advisory Given  Plan Discussed with: Anesthesiologist, CRNA and Surgeon  Anesthesia Plan Comments:         Anesthesia Quick Evaluation

## 2021-01-01 NOTE — Transfer of Care (Signed)
Immediate Anesthesia Transfer of Care Note  Patient: Alex Castaneda  Procedure(s) Performed: COLONOSCOPY WITH PROPOFOL  Patient Location: PACU  Anesthesia Type:General  Level of Consciousness: sedated  Airway & Oxygen Therapy: Patient Spontanous Breathing and Patient connected to nasal cannula oxygen  Post-op Assessment: Report given to RN and Post -op Vital signs reviewed and stable  Post vital signs: Reviewed and stable  Last Vitals:  Vitals Value Taken Time  BP 104/71 01/01/21 1220  Temp    Pulse 63 01/01/21 1222  Resp 19 01/01/21 1222  SpO2 94 % 01/01/21 1222  Vitals shown include unvalidated device data.  Last Pain:  Vitals:   01/01/21 1052  TempSrc: Temporal         Complications: No notable events documented.

## 2021-01-02 ENCOUNTER — Encounter: Payer: Self-pay | Admitting: Gastroenterology

## 2021-01-02 LAB — SURGICAL PATHOLOGY

## 2021-02-12 ENCOUNTER — Other Ambulatory Visit: Payer: Self-pay | Admitting: Family Medicine

## 2021-02-14 NOTE — Telephone Encounter (Signed)
Please call pt to schedule annual exam in February or March 2022

## 2021-02-15 NOTE — Telephone Encounter (Signed)
Call patient left voice message to call the office

## 2021-02-22 ENCOUNTER — Telehealth: Payer: Self-pay | Admitting: Family Medicine

## 2021-02-26 NOTE — Telephone Encounter (Signed)
He is already scheduled for 9/27 '@1040'$ 

## 2021-03-13 ENCOUNTER — Ambulatory Visit (INDEPENDENT_AMBULATORY_CARE_PROVIDER_SITE_OTHER): Payer: Federal, State, Local not specified - PPO | Admitting: Family Medicine

## 2021-03-13 ENCOUNTER — Encounter: Payer: Self-pay | Admitting: Family Medicine

## 2021-03-13 ENCOUNTER — Other Ambulatory Visit: Payer: Self-pay

## 2021-03-13 VITALS — BP 142/80 | HR 48 | Temp 97.0°F | Ht 66.0 in | Wt 207.2 lb

## 2021-03-13 DIAGNOSIS — M2550 Pain in unspecified joint: Secondary | ICD-10-CM | POA: Diagnosis not present

## 2021-03-13 DIAGNOSIS — Z23 Encounter for immunization: Secondary | ICD-10-CM | POA: Diagnosis not present

## 2021-03-13 DIAGNOSIS — I10 Essential (primary) hypertension: Secondary | ICD-10-CM | POA: Diagnosis not present

## 2021-03-13 DIAGNOSIS — Z0000 Encounter for general adult medical examination without abnormal findings: Secondary | ICD-10-CM | POA: Diagnosis not present

## 2021-03-13 DIAGNOSIS — E782 Mixed hyperlipidemia: Secondary | ICD-10-CM

## 2021-03-13 LAB — CBC WITH DIFFERENTIAL/PLATELET
Basophils Absolute: 0 10*3/uL (ref 0.0–0.1)
Basophils Relative: 1.2 % (ref 0.0–3.0)
Eosinophils Absolute: 0.3 10*3/uL (ref 0.0–0.7)
Eosinophils Relative: 8.5 % — ABNORMAL HIGH (ref 0.0–5.0)
HCT: 42.1 % (ref 39.0–52.0)
Hemoglobin: 14.1 g/dL (ref 13.0–17.0)
Lymphocytes Relative: 53.4 % — ABNORMAL HIGH (ref 12.0–46.0)
Lymphs Abs: 2.1 10*3/uL (ref 0.7–4.0)
MCHC: 33.5 g/dL (ref 30.0–36.0)
MCV: 88.6 fl (ref 78.0–100.0)
Monocytes Absolute: 0.4 10*3/uL (ref 0.1–1.0)
Monocytes Relative: 10.3 % (ref 3.0–12.0)
Neutro Abs: 1 10*3/uL — ABNORMAL LOW (ref 1.4–7.7)
Neutrophils Relative %: 26.6 % — ABNORMAL LOW (ref 43.0–77.0)
Platelets: 197 10*3/uL (ref 150.0–400.0)
RBC: 4.76 Mil/uL (ref 4.22–5.81)
RDW: 15.2 % (ref 11.5–15.5)
WBC: 3.9 10*3/uL — ABNORMAL LOW (ref 4.0–10.5)

## 2021-03-13 LAB — LIPID PANEL
Cholesterol: 173 mg/dL (ref 0–200)
HDL: 42.1 mg/dL (ref >39.00)
LDL Cholesterol: 102 mg/dL — ABNORMAL HIGH (ref 0–99)
NonHDL: 130.47
Total CHOL/HDL Ratio: 4
Triglycerides: 143 mg/dL (ref 0.0–149.0)
VLDL: 28.6 mg/dL (ref 0.0–40.0)

## 2021-03-13 LAB — COMPREHENSIVE METABOLIC PANEL
ALT: 51 U/L (ref 0–53)
AST: 35 U/L (ref 0–37)
Albumin: 4.6 g/dL (ref 3.5–5.2)
Alkaline Phosphatase: 80 U/L (ref 39–117)
BUN: 7 mg/dL (ref 6–23)
CO2: 28 mEq/L (ref 19–32)
Calcium: 9.7 mg/dL (ref 8.4–10.5)
Chloride: 106 mEq/L (ref 96–112)
Creatinine, Ser: 1.02 mg/dL (ref 0.40–1.50)
GFR: 77.8 mL/min (ref >60.00)
Glucose, Bld: 86 mg/dL (ref 70–99)
Potassium: 4.1 mEq/L (ref 3.5–5.1)
Sodium: 141 mEq/L (ref 135–145)
Total Bilirubin: 0.8 mg/dL (ref 0.2–1.2)
Total Protein: 7 g/dL (ref 6.0–8.3)

## 2021-03-13 MED ORDER — METOPROLOL SUCCINATE ER 100 MG PO TB24: 100.0000 mg | ORAL_TABLET | Freq: Every day | ORAL | 3 refills | Status: DC

## 2021-03-13 NOTE — Patient Instructions (Signed)
Hip pain - make sure you strengthen your glutes   Hand pain - labs today - voltaren gel topical as needed  Swelling - may be amlodipine - can do compression socks

## 2021-03-13 NOTE — Assessment & Plan Note (Signed)
Does not want statin. Encouraged considering Coronary CT scan for more information given ASCVD risk is elevated.

## 2021-03-13 NOTE — Progress Notes (Signed)
Annual Exam   Chief Complaint:  Chief Complaint  Patient presents with   Annual Exam    No concerns    History of Present Illness:  Alex Castaneda is a 65 y.o. presents today for annual examination.     Nutrition/Lifestyle Diet: generally good Exercise: running 6 days a week He is single partner, contraception - status post hysterectomy.  Any issues with getting or keeping erection? No  Social History   Tobacco Use  Smoking Status Former   Packs/day: 1.00   Years: 18.00   Pack years: 18.00   Types: Cigarettes   Quit date: 07/11/1989   Years since quitting: 31.6  Smokeless Tobacco Never   Social History   Substance and Sexual Activity  Alcohol Use Not Currently   Comment: 1 drink 1-2 time a month   Social History   Substance and Sexual Activity  Drug Use Never     Safety The patient wears seatbelts: yes.     The patient feels safe at home and in their relationships: yes.  General Health Dentist in the last year: Yes Eye doctor: yes  Weight Wt Readings from Last 3 Encounters:  03/13/21 207 lb 4 oz (94 kg)  01/01/21 210 lb (95.3 kg)  11/22/20 215 lb 12 oz (97.9 kg)   Patient has high BMI  BMI Readings from Last 1 Encounters:  03/13/21 33.45 kg/m     Chronic disease screening Blood pressure monitoring:  BP Readings from Last 3 Encounters:  03/13/21 (!) 142/80  01/01/21 (!) 126/96  11/22/20 118/78    Lipid Monitoring: Indication for screening: age >35, obesity, diabetes, family hx, CV risk factors.  Lipid screening: Yes  Lab Results  Component Value Date   CHOL 141 01/10/2020   HDL 53.30 01/10/2020   LDLCALC 42 12/24/2018   LDLDIRECT 56.0 01/10/2020   TRIG 247.0 (H) 01/10/2020   CHOLHDL 3 01/10/2020     Diabetes Screening: age >41, overweight, family hx, PCOS, hx of gestational diabetes, at risk ethnicity, elevated blood pressure >135/80.  Diabetes Screening screening: Yes  Lab Results  Component Value Date   HGBA1C 5.8 06/15/2020      Prostate Cancer Screening: Yes Age 34-69 yo Shared Decision Making Higher Risk: Older age, African American, Family Hx of Prostate Cancer - Yes Benefits: screening may prevent 1.3 deaths from prostate cancer over 13 years per 1000 men screened and prevent 3 metastatic cases per 1000 men screened. Not enough evidence to support more benefit for AA or [place] Harms: False Positive and psychological harms. 15% of me with false positive over a 2 to 4 year period > resulting in biopsy and complications such as pain, hematospermia, infections. Overdiagnosis - increases with age - found that 20-50% of prostate cancer through screening may have never caused any issues. Harms of treatment include - erectile dysfunction, urinary incontinence, and bothersome bowel symptoms.   After discussion he does want to get a PSA checked today.   Inadequate evidence for screening <55 No mortality benefit for screening >70   Lab Results  Component Value Date   PSA 0.8 12/24/2018       Colon Cancer Screening:  Age 69-75 yo - benefits outweigh the risk. Adults 75-85 yo who have never been screened benefit.  Benefits: 134000 people in 2016 will be diagnosed and 49,000 will die - early detection helps Harms: Complications 2/2 to colonoscopy High Risk (Colonoscopy): genetic disorder (Lynch syndrome or familial adenomatous polyposis), personal hx of IBD, previous adenomatous polyp, or previous  colorectal cancer, FamHx start 10 years before the age at diagnosis, increased in males and black race  Options:  FIT - looks for hemoglobin (blood in the stool) - specific and fairly sensitive - must be done annually Cologuard - looks for DNA and blood - more sensitive - therefore can have more false positives, every 3 years Colonoscopy - every 10 years if normal - sedation, bowl prep, must have someone drive you  Shared decision making and the patient had decided to do colonoscopy - due 2023.   Social History    Tobacco Use  Smoking Status Former   Packs/day: 1.00   Years: 18.00   Pack years: 18.00   Types: Cigarettes   Quit date: 07/11/1989   Years since quitting: 31.6  Smokeless Tobacco Never    Lung Cancer Screening (Ages 24-26): not applicable   Abdominal Aortic Aneurysm:  Age 83-75, 1 time screening, men who have ever smoked not yet age    Past Medical History:  Diagnosis Date   Arthritis    Colon polyps    GERD (gastroesophageal reflux disease)    Hyperlipidemia    Hypertension    Thyroid disease     Past Surgical History:  Procedure Laterality Date   COLONOSCOPY WITH PROPOFOL N/A 01/01/2021   Procedure: COLONOSCOPY WITH PROPOFOL;  Surgeon: Virgel Manifold, MD;  Location: [facility] ENDOSCOPY;  Service: Endoscopy;  Laterality: N/A;   ELBOW SURGERY Right    HERNIA REPAIR     Umbilical   KNEE SURGERY Left    arthroscopic   ROTATOR CUFF REPAIR Right     Prior to Admission medications   Medication Sig Start Date End Date Taking? Authorizing Provider  allopurinol (ZYLOPRIM) 100 MG tablet TAKE 1 TABLET(100 MG) BY MOUTH DAILY 02/14/21  Yes Lesleigh Noe, MD  amLODipine (NORVASC) 5 MG tablet Take 1 tablet (5 mg total) by mouth daily. 11/14/20  Yes Lesleigh Noe, MD  clindamycin-benzoyl peroxide [facility]) gel Apply topically 2 (two) times daily. Bid to aa face, trunk 04/20/20  Yes Ralene Bathe, MD  colchicine 0.6 MG tablet TAKE 1 TABLET BY MOUTH DAILY 02/23/21  Yes Lesleigh Noe, MD  metoprolol succinate (TOPROL-XL) 100 MG 24 hr tablet Take 1 tablet (100 mg total) by mouth daily. Take with or immediately following a meal. 10/13/20  Yes Lesleigh Noe, MD  naproxen (NAPROSYN) 500 MG tablet Take 1 tablet (500 mg total) by mouth 2 (two) times daily with a meal. 02/29/20  Yes Edrick Kins, DPM  Oxcarbazepine (TRILEPTAL) 300 MG tablet TAKE 1 TABLET(300 MG) BY MOUTH TWICE DAILY Patient taking differently: Take 300 mg by mouth 2 (two) times daily as needed. 08/15/20  Yes Lesleigh Noe, MD    Allergies  Allergen Reactions   Naproxen     Other reaction(s): Unknown to Patient//Family   Nsaids     Other reaction(s): Unknown to Patient//Family   Salicylates     Other reaction(s): Unknown to Patient//Family     Social History   Socioeconomic History   Marital status: Married    Spouse name: Chrys Racer   Number of children: 2   Years of education: Associates degree   Highest education level: Not on file  Occupational History   Not on file  Tobacco Use   Smoking status: Former    Packs/day: 1.00    Years: 18.00    Pack years: 18.00    Types: Cigarettes    Quit date: 07/11/1989  Years since quitting: 31.6   Smokeless tobacco: Never  Vaping Use   Vaping Use: Never used  Substance and Sexual Activity   Alcohol use: Not Currently    Comment: 1 drink 1-2 time a month   Drug use: Never   Sexual activity: Not Currently  Other Topics Concern   Not on file  Social History Narrative   07/12/19   From: moved from [place] - but from [place] originally    Living: with wife Chrys Racer - 51 years   Work: retired from [employer]: 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, dispensing and [place] - live nearby ([place], [place]) - grandkids - 8 - and 2 great grandchildren      Enjoys: working on cars, walking, helping and supporting family      Exercise: walking - regularly, hard in the winter   Diet: better now      Safety   Seat belts: Yes    Guns: No   Safe in relationships: Yes    Social Determinants of Radio broadcast assistant Strain: Not on file  Food Insecurity: Not on file  Transportation Needs: Not on file  Physical Activity: Not on file  Stress: Not on file  Social Connections: Not on file  Intimate Partner Violence: Not on file    Family History  Problem Relation Age of Onset   Throat cancer Mother        smoker   COPD Sister    Cancer Brother        unknown   Arthritis Sister    Diabetes Sister    Diabetes Sister    Stroke Brother 68       tobacco use     Review of Systems  Constitutional:  Negative for chills and fever.  HENT:  Negative for congestion and sore throat.   Eyes:  Negative for blurred vision and double vision.  Respiratory:  Negative for shortness of breath.   Cardiovascular:  Positive for leg swelling (at sock line). Negative for chest pain.  Gastrointestinal:  Negative for heartburn, nausea and vomiting.  Genitourinary: Negative.   Musculoskeletal:  Positive for joint pain (sore and stiffness after exercise). Negative for myalgias.       Hand swelling and pain, occasionally and multiple hand arthritis  Skin:  Negative for rash.  Neurological:  Negative for dizziness and headaches.  Endo/Heme/Allergies:  Does not bruise/bleed easily.  Psychiatric/Behavioral:  Negative for depression. The patient is not nervous/anxious.     Physical Exam BP (!) 142/80   Pulse (!) 48   Temp (!) 97 F (36.1 C) (Temporal)   Ht 5\' 6"  (1.676 m)   Wt 207 lb 4 oz (94 kg)   SpO2 97%   BMI 33.45 kg/m    BP Readings from Last 3 Encounters:  03/13/21 (!) 142/80  01/01/21 (!) 126/96  11/22/20 118/78      Physical Exam Constitutional:      General: He is not in acute distress.    Appearance: He is well-developed. He is not diaphoretic.  HENT:     Head: Normocephalic and atraumatic.     Right Ear: Tympanic membrane and ear canal normal.     Left Ear: Tympanic membrane and ear canal normal.     Nose: Nose normal.     Mouth/Throat:     Pharynx: Uvula midline.  Eyes:     General: No scleral icterus.    Conjunctiva/sclera: Conjunctivae normal.     Pupils: Pupils  are equal, round, and reactive to light.  Cardiovascular:     Rate and Rhythm: Normal rate and regular rhythm.     Heart sounds: Normal heart sounds. No murmur heard. Pulmonary:     Effort: Pulmonary effort is normal. No respiratory distress.     Breath sounds: Normal breath sounds. No wheezing.  Abdominal:     General: Bowel sounds are normal. There is no distension.      Palpations: Abdomen is soft. There is no mass.     Tenderness: There is no abdominal tenderness. There is no guarding.  Musculoskeletal:        General: Normal range of motion.     Cervical back: Normal range of motion and neck supple.     Comments: Right hand Some swelling on the PIP joint of the 2nd digit Otherwise normal rom and strength.  Left hip Strength normal Rom normal w/o pain  Lymphadenopathy:     Cervical: No cervical adenopathy.  Skin:    General: Skin is warm and dry.     Capillary Refill: Capillary refill takes less than 2 seconds.  Neurological:     Mental Status: He is alert and oriented to person, place, and time.       Results:  PHQ-9:  Depression screen [facility] 2/9 06/15/2020 07/12/2019  Decreased Interest 0 0  Down, Depressed, Hopeless 0 0  PHQ - 2 Score 0 0       Assessment: 65 y.o. here for routine annual physical examination.  Plan: Problem List Items Addressed This Visit       Cardiovascular and Mediastinum   Hypertension   Relevant Medications   metoprolol succinate (TOPROL-XL) 100 MG 24 hr tablet   Other Relevant Orders   CT CARDIAC SCORING (SELF PAY ONLY)   Comprehensive metabolic panel   CBC with Differential     Other   Hyperlipidemia    Does not want statin. Encouraged considering Coronary CT scan for more information given ASCVD risk is elevated.       Relevant Medications   metoprolol succinate (TOPROL-XL) 100 MG 24 hr tablet   Other Relevant Orders   CT CARDIAC SCORING (SELF PAY ONLY)   Lipid panel   Other Visit Diagnoses     Annual physical exam    -  Primary   Polyarthralgia       Relevant Orders   ANA   Rheumatoid factor       Screening: -- Blood pressure screen  normal at home, continue to monitor -- cholesterol screening: will obtain -- Weight screening: overweight: continue to monitor -- Diabetes Screening: will obtain -- Nutrition: normal - Encouraged healthy diet  The 10-year ASCVD risk score  (Arnett DK, et al., 2019) is: 16.6%   Values used to calculate the score:     Age: 70 years     Sex: Male     Is Non-Hispanic African American: Yes     Diabetic: No     Tobacco smoker: No     Systolic Blood Pressure: 660 mmHg     Is BP treated: Yes     HDL Cholesterol: 53.3 mg/dL     Total Cholesterol: 141 mg/dL  -- ASA 81 mg discussed if CVD risk >10% age 10-59 and willing to take for 10 years -- Statin therapy for Age 71-75 with CVD risk >7.5%  Psych -- Depression screening (PHQ-9): negative  Safety -- tobacco screening: not using -- alcohol screening:  low-risk usage. -- no evidence of domestic  violence or intimate partner violence.  Cancer Screening -- Prostate (age 83-69) ordered -- Colon (age 58-75)  up to date -- Lung not indicated   Immunizations Immunization History  Administered Date(s) Administered   Influenza, Seasonal, Injecte, Preservative Fre 03/10/2019   Influenza,inj,Quad PF,6+ Mos 02/22/2020, 03/13/2021   Influenza-Unspecified 02/22/2020   PFIZER(Purple Top)SARS-COV-2 Vaccination 09/15/2019, 10/06/2019   Pneumococcal Conjugate-13 06/26/2018   Tdap 02/22/2020    -- flu vaccine up to date -- TDAP q10 years up to date -- Shingles (age >22) unknown, record requested -- Covid-19 Vaccine up to date  Encouraged regular vision and dental screening. Encouraged healthy exercise and diet.   Lesleigh Noe

## 2021-03-15 ENCOUNTER — Encounter: Payer: Self-pay | Admitting: Family Medicine

## 2021-03-15 DIAGNOSIS — R7303 Prediabetes: Secondary | ICD-10-CM

## 2021-03-15 DIAGNOSIS — Z125 Encounter for screening for malignant neoplasm of prostate: Secondary | ICD-10-CM

## 2021-03-15 LAB — ANA: Anti Nuclear Antibody (ANA): NEGATIVE

## 2021-03-15 LAB — RHEUMATOID FACTOR: Rheumatoid fact SerPl-aCnc: <14 IU/mL (ref <14)

## 2021-03-22 NOTE — Addendum Note (Signed)
Addended by: Lesleigh Noe on: 03/22/2021 04:57 PM   Modules accepted: Orders

## 2021-03-27 ENCOUNTER — Other Ambulatory Visit: Payer: Self-pay

## 2021-03-27 ENCOUNTER — Other Ambulatory Visit (INDEPENDENT_AMBULATORY_CARE_PROVIDER_SITE_OTHER): Payer: Federal, State, Local not specified - PPO

## 2021-03-27 ENCOUNTER — Other Ambulatory Visit: Payer: Federal, State, Local not specified - PPO

## 2021-03-27 DIAGNOSIS — Z125 Encounter for screening for malignant neoplasm of prostate: Secondary | ICD-10-CM

## 2021-03-27 DIAGNOSIS — R7303 Prediabetes: Secondary | ICD-10-CM

## 2021-03-27 LAB — HEMOGLOBIN A1C: Hgb A1c MFr Bld: 5.7 % (ref 4.6–6.5)

## 2021-03-27 LAB — PSA: PSA: 0.76 ng/mL (ref 0.10–4.00)

## 2021-04-03 ENCOUNTER — Ambulatory Visit
Admission: RE | Admit: 2021-04-03 | Discharge: 2021-04-03 | Disposition: A | Discharge status: Home / Self Care | Payer: Federal, State, Local not specified - PPO | Source: Ambulatory Visit | Attending: Family Medicine | Admitting: Family Medicine

## 2021-04-03 ENCOUNTER — Other Ambulatory Visit: Payer: Self-pay

## 2021-04-03 DIAGNOSIS — E782 Mixed hyperlipidemia: Secondary | ICD-10-CM | POA: Insufficient documentation

## 2021-04-03 DIAGNOSIS — I10 Essential (primary) hypertension: Secondary | ICD-10-CM | POA: Insufficient documentation

## 2021-04-04 ENCOUNTER — Ambulatory Visit: Payer: Federal, State, Local not specified - PPO | Admitting: Gastroenterology

## 2021-04-04 VITALS — BP 157/73 | HR 51 | Temp 98.1°F | Ht 67.0 in | Wt 210.0 lb

## 2021-04-04 DIAGNOSIS — K635 Polyp of colon: Secondary | ICD-10-CM | POA: Diagnosis not present

## 2021-04-05 NOTE — Progress Notes (Signed)
Vonda Antigua, MD [address]  [facility]  [place], [place] [address]  Main: [phone]  Fax: [phone]   Primary Care Physician: Lesleigh Noe, MD   Chief Complaint  Patient presents with   Follow-up    3 month... Pt denies any new concerns at this time     HPI: Alex Castaneda is a 65 y.o. male here for follow-up of colon polyps.  Patient recently underwent outpatient colonoscopy for history of polyps.  A 6 mm polyp was seen in the sigmoid colon and removed.  Internal hemorrhoids were noted.  Pathology showed a hyperplastic polyp.  As per my H&P on the day of the procedure "Pt states he has had previous colonoscopies in [facility] and was advised to get a colonoscopy every 5 years due to polyps on previous colonoscopies. [facility] records not available. Colonoscopy in 2015 was normal. See scanned report."   This appointment was made to ensure we are able to review and obtain records to determine surveillance intervals for his future colonoscopies  The patient denies abdominal or flank pain, anorexia, nausea or vomiting, dysphagia, change in bowel habits or black or bloody stools or weight loss.    ROS: All ROS reviewed and negative except as per HPI   Past Medical History:  Diagnosis Date   Arthritis    Colon polyps    GERD (gastroesophageal reflux disease)    Hyperlipidemia    Hypertension    Thyroid disease     Past Surgical History:  Procedure Laterality Date   COLONOSCOPY WITH PROPOFOL N/A 01/01/2021   Procedure: COLONOSCOPY WITH PROPOFOL;  Surgeon: Virgel Manifold, MD;  Location: [facility] ENDOSCOPY;  Service: Endoscopy;  Laterality: N/A;   ELBOW SURGERY Right    HERNIA REPAIR     Umbilical   KNEE SURGERY Left    arthroscopic   ROTATOR CUFF REPAIR Right     Prior to Admission medications   Medication Sig Start Date End Date Taking? Authorizing Provider  allopurinol (ZYLOPRIM) 100 MG tablet TAKE 1 TABLET(100 MG) BY MOUTH DAILY 02/14/21  Yes Lesleigh Noe, MD  amLODipine (NORVASC) 5 MG tablet Take 1 tablet (5 mg total) by mouth daily. 11/14/20  Yes Lesleigh Noe, MD  clindamycin-benzoyl peroxide [facility]) gel Apply topically 2 (two) times daily. Bid to aa face, trunk 04/20/20  Yes Ralene Bathe, MD  colchicine 0.6 MG tablet TAKE 1 TABLET BY MOUTH DAILY 02/23/21  Yes Lesleigh Noe, MD  metoprolol succinate (TOPROL-XL) 100 MG 24 hr tablet Take 1 tablet (100 mg total) by mouth daily. Take with or immediately following a meal. 03/13/21  Yes Lesleigh Noe, MD  naproxen (NAPROSYN) 500 MG tablet Take 1 tablet (500 mg total) by mouth 2 (two) times daily with a meal. 02/29/20  Yes Edrick Kins, DPM  Oxcarbazepine (TRILEPTAL) 300 MG tablet TAKE 1 TABLET(300 MG) BY MOUTH TWICE DAILY Patient taking differently: Take 300 mg by mouth 2 (two) times daily as needed. 08/15/20  Yes Lesleigh Noe, MD    Family History  Problem Relation Age of Onset   Throat cancer Mother        smoker   COPD Sister    Cancer Brother        unknown   Arthritis Sister    Diabetes Sister    Diabetes Sister    Stroke Brother 30       tobacco use     Social History   Tobacco Use  Smoking status: Former    Packs/day: 1.00    Years: 18.00    Pack years: 18.00    Types: Cigarettes    Quit date: 07/11/1989    Years since quitting: 31.7   Smokeless tobacco: Never  Vaping Use   Vaping Use: Never used  Substance Use Topics   Alcohol use: Not Currently    Comment: 1 drink 1-2 time a month   Drug use: Never    Allergies as of 04/04/2021 - Review Complete 04/04/2021  Allergen Reaction Noted   Naproxen  03/22/2019   Nsaids  25/42/7062   Salicylates  37/62/8315    Physical Examination:  Constitutional: General:   Alert,  Well-developed, well-nourished, pleasant and cooperative in NAD BP (!) 157/73   Pulse (!) 51   Temp 98.1 F (36.7 C) (Oral)   Ht 5\' 7"  (1.702 m)   Wt 210 lb (95.3 kg)   BMI 32.89 kg/m   Respiratory: Normal respiratory  effort  Gastrointestinal:  Soft, non-tender and non-distended without masses, hepatosplenomegaly or hernias noted.  No guarding or rebound tenderness.     Cardiac: No clubbing or edema.  No cyanosis. Normal posterior tibial pedal pulses noted.  Psych:  Alert and cooperative. Normal mood and affect.  Musculoskeletal:  Normal gait. Head normocephalic, atraumatic. Symmetrical without gross deformities. 5/5 Lower extremity strength bilaterally.  Skin: Warm. Intact without significant lesions or rashes. No jaundice.  Neck: Supple, trachea midline  Lymph: No cervical lymphadenopathy  Psych:  Alert and oriented x3, Alert and cooperative. Normal mood and affect.  Labs: CMP     Component Value Date/Time   NA 141 03/13/2021 1119   NA 142 12/24/2018 0000   K 4.1 03/13/2021 1119   CL 106 03/13/2021 1119   CO2 28 03/13/2021 1119   GLUCOSE 86 03/13/2021 1119   BUN 7 03/13/2021 1119   BUN 8 12/24/2018 0000   CREATININE 1.02 03/13/2021 1119   CALCIUM 9.7 03/13/2021 1119   PROT 7.0 03/13/2021 1119   ALBUMIN 4.6 03/13/2021 1119   AST 35 03/13/2021 1119   ALT 51 03/13/2021 1119   ALKPHOS 80 03/13/2021 1119   BILITOT 0.8 03/13/2021 1119   Lab Results  Component Value Date   WBC 3.9 (L) 03/13/2021   HGB 14.1 03/13/2021   HCT 42.1 03/13/2021   MCV 88.6 03/13/2021   PLT 197.0 03/13/2021    Imaging Studies:   Assessment and Plan:   Cypress Fanfan is a 65 y.o. y/o male who recently underwent a colonoscopy and a polyp was removed which was hyperplastic, with colonoscopy being normal in 2015 and previous history of colon polyps in [place]  Apparently, the practice that he had his colonoscopies within [place] has been closed or retired as per clinic staff that tried to contact them.  Therefore, we have been unable to obtain any previous records in this regard.  We may or may not be able to find the name of the facility if patient has any paperwork for this at home and if he does he can  contact us.  Otherwise, given that he had a normal colonoscopy in 2015 and did not have any precancerous polyps on this recent colonoscopy, will place 10-year screening recommendations.  However, if past records are able to be obtained, this recommendation can be changed, but it appears we are unlikely to be able to get a hold of previous records given that the practice is closed or retired and at this time patient does not have any  other information where we can send a record release request to.  Patient advised to call us if he has any abdominal symptoms, rectal bleeding, unexplained weight loss, changes in family history of colon cancer, or any concerns or questions.  Patient would not like a follow-up appointment at this time.  Dr Vonda Antigua

## 2021-04-26 ENCOUNTER — Ambulatory Visit: Payer: Federal, State, Local not specified - PPO | Admitting: Dermatology

## 2021-05-02 ENCOUNTER — Other Ambulatory Visit: Payer: Self-pay | Admitting: Podiatry

## 2021-05-02 NOTE — Telephone Encounter (Signed)
6 months since last appointment, f/u needed

## 2021-05-30 ENCOUNTER — Other Ambulatory Visit: Payer: Self-pay | Admitting: Family Medicine

## 2021-07-29 ENCOUNTER — Other Ambulatory Visit: Payer: Self-pay | Admitting: Dermatology

## 2021-07-29 ENCOUNTER — Encounter: Payer: Self-pay | Admitting: Family Medicine

## 2021-07-29 DIAGNOSIS — L739 Follicular disorder, unspecified: Secondary | ICD-10-CM

## 2021-11-19 ENCOUNTER — Other Ambulatory Visit: Payer: Self-pay | Admitting: Family Medicine

## 2021-11-19 ENCOUNTER — Telehealth: Payer: Self-pay | Admitting: Family Medicine

## 2021-11-20 NOTE — Telephone Encounter (Signed)
Pt scheduled  

## 2021-12-20 ENCOUNTER — Other Ambulatory Visit: Payer: Self-pay | Admitting: Family Medicine

## 2022-01-28 ENCOUNTER — Encounter: Payer: Self-pay | Admitting: Family Medicine

## 2022-01-31 ENCOUNTER — Ambulatory Visit (INDEPENDENT_AMBULATORY_CARE_PROVIDER_SITE_OTHER)
Admission: RE | Admit: 2022-01-31 | Discharge: 2022-01-31 | Disposition: A | Discharge status: Home / Self Care | Payer: Medicare Other | Source: Ambulatory Visit | Attending: Family Medicine | Admitting: Family Medicine

## 2022-01-31 ENCOUNTER — Other Ambulatory Visit: Payer: Self-pay | Admitting: Family Medicine

## 2022-01-31 ENCOUNTER — Ambulatory Visit: Payer: Medicare Other | Admitting: Family Medicine

## 2022-01-31 VITALS — BP 138/80 | HR 51 | Temp 97.7°F | Ht 66.0 in | Wt 214.0 lb

## 2022-01-31 DIAGNOSIS — E782 Mixed hyperlipidemia: Secondary | ICD-10-CM | POA: Diagnosis not present

## 2022-01-31 DIAGNOSIS — M25562 Pain in left knee: Secondary | ICD-10-CM

## 2022-01-31 DIAGNOSIS — I10 Essential (primary) hypertension: Secondary | ICD-10-CM | POA: Diagnosis not present

## 2022-01-31 LAB — LIPID PANEL
Cholesterol: 168 mg/dL (ref 0–200)
HDL: 41.7 mg/dL (ref >39.00)
LDL Cholesterol: 98 mg/dL (ref 0–99)
NonHDL: 125.84
Total CHOL/HDL Ratio: 4
Triglycerides: 140 mg/dL (ref 0.0–149.0)
VLDL: 28 mg/dL (ref 0.0–40.0)

## 2022-01-31 LAB — COMPREHENSIVE METABOLIC PANEL
ALT: 81 U/L — ABNORMAL HIGH (ref 0–53)
AST: 64 U/L — ABNORMAL HIGH (ref 0–37)
Albumin: 4.4 g/dL (ref 3.5–5.2)
Alkaline Phosphatase: 84 U/L (ref 39–117)
BUN: 9 mg/dL (ref 6–23)
CO2: 28 mEq/L (ref 19–32)
Calcium: 9.5 mg/dL (ref 8.4–10.5)
Chloride: 105 mEq/L (ref 96–112)
Creatinine, Ser: 1.15 mg/dL (ref 0.40–1.50)
GFR: 66.95 mL/min (ref >60.00)
Glucose, Bld: 107 mg/dL — ABNORMAL HIGH (ref 70–99)
Potassium: 3.9 mEq/L (ref 3.5–5.1)
Sodium: 139 mEq/L (ref 135–145)
Total Bilirubin: 0.6 mg/dL (ref 0.2–1.2)
Total Protein: 7 g/dL (ref 6.0–8.3)

## 2022-01-31 LAB — CBC
HCT: 40 % (ref 39.0–52.0)
Hemoglobin: 12.9 g/dL — ABNORMAL LOW (ref 13.0–17.0)
MCHC: 32.3 g/dL (ref 30.0–36.0)
MCV: 83.8 fl (ref 78.0–100.0)
Platelets: 204 10*3/uL (ref 150.0–400.0)
RBC: 4.78 Mil/uL (ref 4.22–5.81)
RDW: 16.4 % — ABNORMAL HIGH (ref 11.5–15.5)
WBC: 4.3 10*3/uL (ref 4.0–10.5)

## 2022-01-31 MED ORDER — MELOXICAM 15 MG PO TABS: 15.0000 mg | ORAL_TABLET | Freq: Every day | ORAL | 0 refills | Status: DC

## 2022-01-31 MED ORDER — METOPROLOL SUCCINATE ER 100 MG PO TB24: 100.0000 mg | ORAL_TABLET | Freq: Every day | ORAL | 1 refills | Status: DC

## 2022-01-31 NOTE — Assessment & Plan Note (Addendum)
Due for labs. Not on statin, but coronary calcium score was 0 last year. Cont healthy diet/exercise.

## 2022-01-31 NOTE — Assessment & Plan Note (Signed)
BP controlled. Cont amlodipine 5 mg, metoprolol 100 mg

## 2022-01-31 NOTE — Progress Notes (Signed)
Subjective:     Alex Castaneda is a 66 y.o. male presenting for Knee Pain (Both knees, but L is progressively worse. Most painful at night. )     Knee Pain  There was no injury mechanism. The pain is present in the left knee. Quality: sharp, throbbing, worse at night. The pain is severe. Pain course: mostly at night. Pertinent negatives include no loss of motion, loss of sensation, muscle weakness, numbness or tingling. Associated symptoms comments: Instability . Exacerbated by: certain positions. He has tried nothing for the symptoms.   Pain is not long lasting but stabbing pain comes on suddenly  Also low energy Not as active this year as normal - not walking as much  Hx of surgery on the left knee for meniscus tear 25 years ago  Worse climbing stairs  Review of Systems  Neurological:  Negative for tingling and numbness.     Social History   Tobacco Use  Smoking Status Former   Packs/day: 1.00   Years: 18.00   Total pack years: 18.00   Types: Cigarettes   Quit date: 07/11/1989   Years since quitting: 32.5  Smokeless Tobacco Never        Objective:    BP Readings from Last 3 Encounters:  01/31/22 138/80  04/04/21 (!) 157/73  03/13/21 (!) 142/80   Wt Readings from Last 3 Encounters:  01/31/22 214 lb (97.1 kg)  04/04/21 210 lb (95.3 kg)  03/13/21 207 lb 4 oz (94 kg)    BP 138/80   Pulse (!) 51   Temp 97.7 F (36.5 C) (Temporal)   Ht '5\' 6"'$  (1.676 m)   Wt 214 lb (97.1 kg)   SpO2 98%   BMI 34.54 kg/m    Physical Exam Constitutional:      Appearance: Normal appearance. He is not ill-appearing or diaphoretic.  HENT:     Right Ear: External ear normal.     Left Ear: External ear normal.     Nose: Nose normal.  Eyes:     General: No scleral icterus.    Extraocular Movements: Extraocular movements intact.     Conjunctiva/sclera: Conjunctivae normal.  Cardiovascular:     Rate and Rhythm: Normal rate and regular rhythm.     Heart sounds: No murmur  heard. Pulmonary:     Effort: Pulmonary effort is normal. No respiratory distress.     Breath sounds: Normal breath sounds. No wheezing.  Musculoskeletal:     Cervical back: Neck supple.     Comments: Left knee Inspection: no swelling, no erythema Palpation: No joint line, patella, bursa ttp ROM: normal w/o pain Strength: normal Lateral ligament testing - no laxity Anterior/posterior draw - normal Meniscus testing does not illicit pain  Skin:    General: Skin is warm and dry.  Neurological:     Mental Status: He is alert. Mental status is at baseline.  Psychiatric:        Mood and Affect: Mood normal.    XR Left knee (my read): patella degenerative changes, overall joint space appears preserved       Assessment & Plan:   Problem List Items Addressed This Visit       Cardiovascular and Mediastinum   Hypertension    BP controlled. Cont amlodipine 5 mg, metoprolol 100 mg      Relevant Medications   metoprolol succinate (TOPROL-XL) 100 MG 24 hr tablet   Other Relevant Orders   Comprehensive metabolic panel   CBC  Other   Hyperlipidemia    Due for labs. Not on statin, but coronary calcium score was 0 last year. Cont healthy diet/exercise.       Relevant Medications   metoprolol succinate (TOPROL-XL) 100 MG 24 hr tablet   Other Relevant Orders   Lipid panel   Acute pain of left knee - Primary    Pt with hx of meniscus repair >30 years ago. Some degenerative changes on XR - will f/u final ready. Trial of NSAIDs, home exercises, Dr. Lorelei Pont to consider injection in 2 weeks if not improving.       Relevant Medications   meloxicam (MOBIC) 15 MG tablet     Return if symptoms worsen or fail to improve.  Lesleigh Noe, MD

## 2022-01-31 NOTE — Assessment & Plan Note (Signed)
Pt with hx of meniscus repair >30 years ago. Some degenerative changes on XR - will f/u final ready. Trial of NSAIDs, home exercises, Dr. Lorelei Pont to consider injection in 2 weeks if not improving.

## 2022-01-31 NOTE — Patient Instructions (Signed)
Left knee pain - try some home exercise - meloxicam  - x-ray today - Dr. Lorelei Pont in 2 weeks if not improved

## 2022-02-01 ENCOUNTER — Encounter: Payer: Self-pay | Admitting: Family Medicine

## 2022-02-01 DIAGNOSIS — R7989 Other specified abnormal findings of blood chemistry: Secondary | ICD-10-CM

## 2022-02-03 ENCOUNTER — Encounter: Payer: Self-pay | Admitting: Family Medicine

## 2022-02-03 DIAGNOSIS — M25562 Pain in left knee: Secondary | ICD-10-CM

## 2022-02-03 IMAGING — DX DG FOOT COMPLETE 3+V*L*
3 series · 3 of 3 positions shown · non-contrast
Comparison: No recent.

CLINICAL DATA: Acute foot pain.

EXAM:
LEFT FOOT - COMPLETE 3+ VIEW

[foot ap]
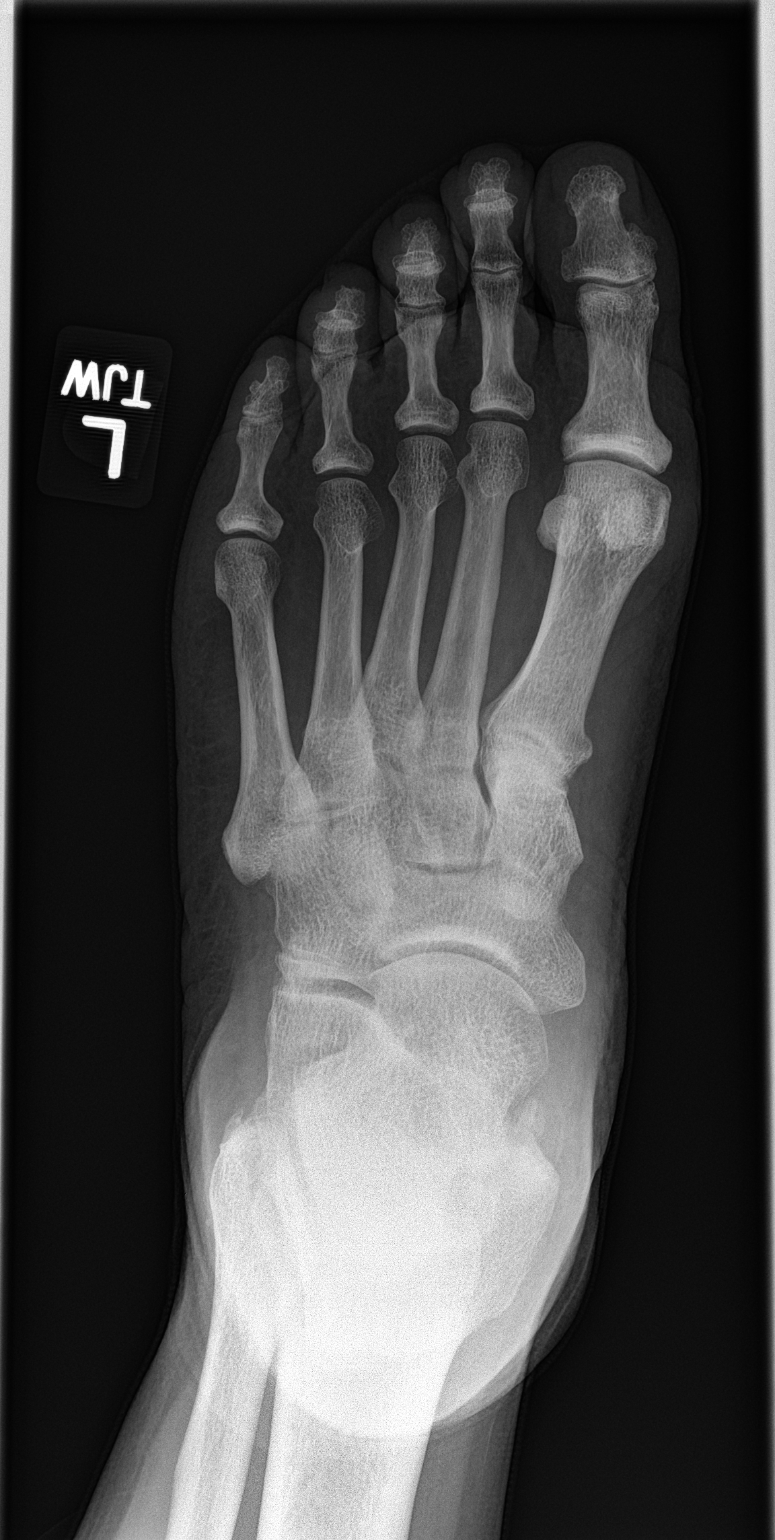

[foot obl]
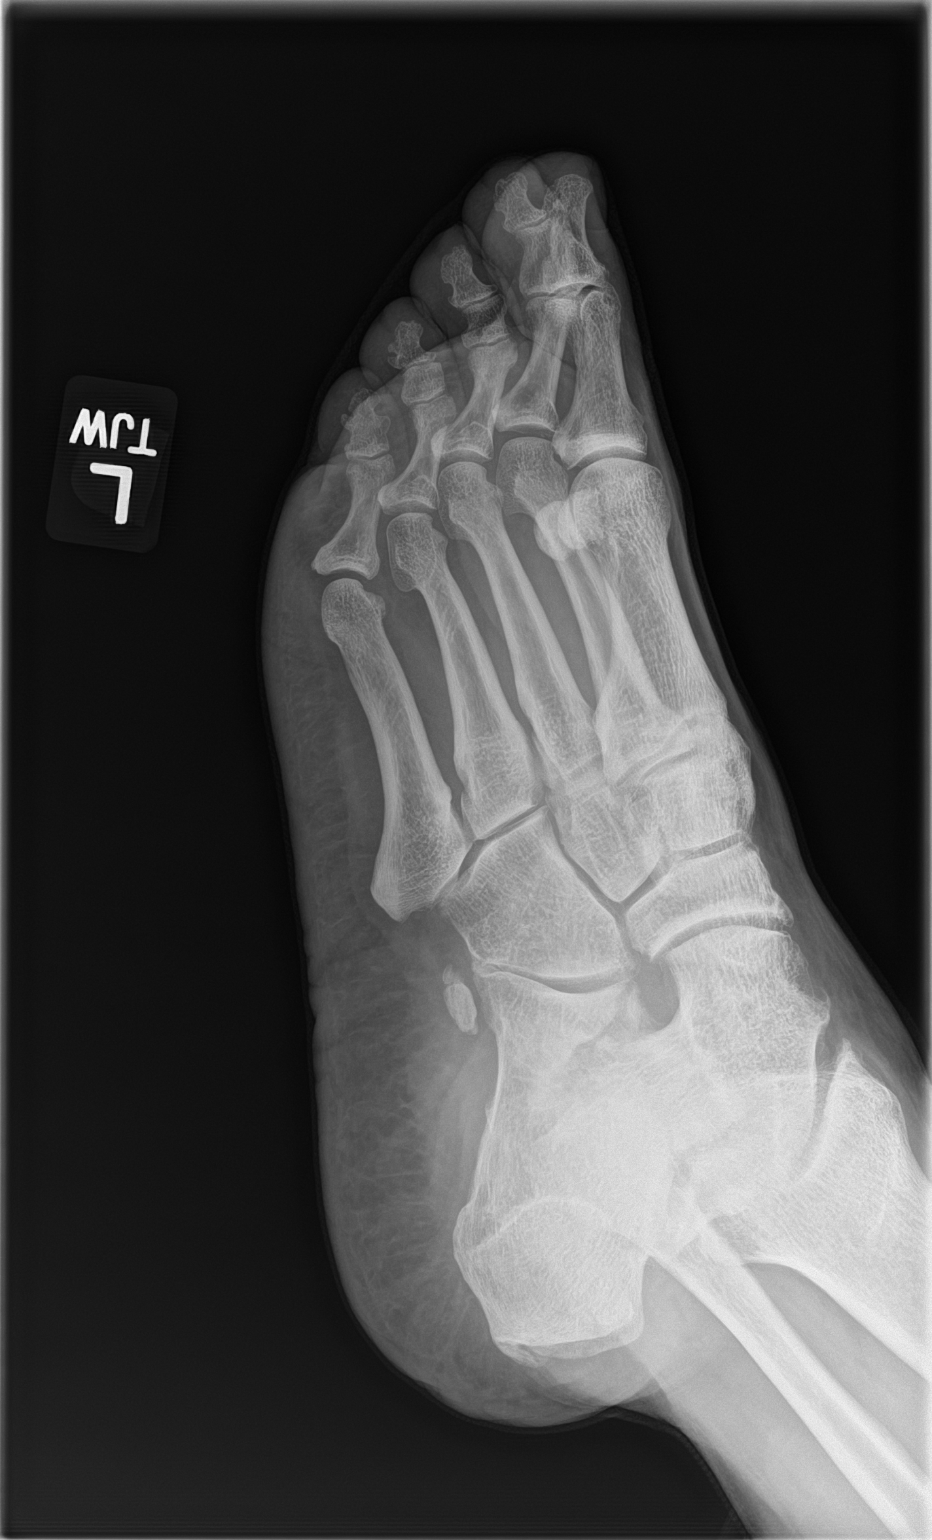

[foot lat]
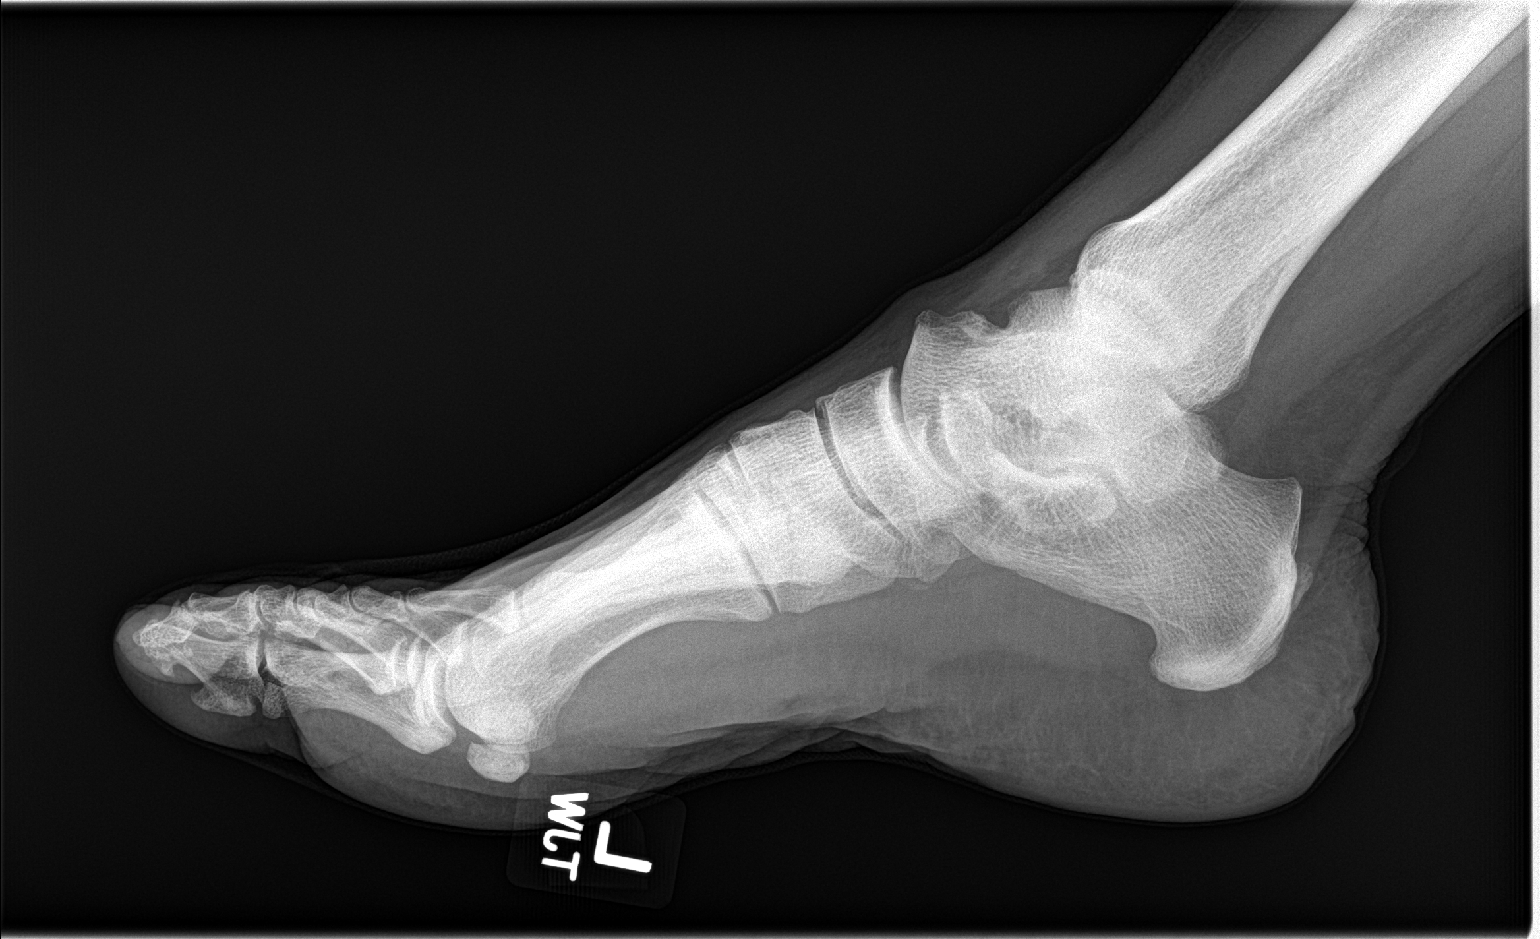

[3 of 3 positions shown; findings below may reference images not displayed]

FINDINGS: No acute bony or joint abnormality identified. No evidence of
fracture or dislocation. If symptoms persist follow-up imaging in
7-10 days may prove useful. Mild diffuse degenerative changes.
IMPRESSION: No acute or focal abnormality active changes.

## 2022-02-04 ENCOUNTER — Other Ambulatory Visit: Payer: Federal, State, Local not specified - PPO

## 2022-02-04 NOTE — Addendum Note (Signed)
Addended by: Ellamae Sia on: 02/04/2022 11:07 AM   Modules accepted: Orders

## 2022-02-05 NOTE — Progress Notes (Unsigned)
    Alex Mccoy T. Alex Skowron, MD, [place] at [facility] [place], [address]  Phone: [phone]  FAX: [phone]  Alex Castaneda - 66 y.o. male  MRN [id number]  Date of Birth: Apr 18, 1956  Date: 02/06/2022  PCP: Lesleigh Noe, MD  Referral: Lesleigh Noe, MD  No chief complaint on file.  Subjective:   Alex Castaneda is a 66 y.o. very pleasant male patient with There is no height or weight on file to calculate BMI. who presents with the following:  Patient presents with some acute left-sided knee pain that is not preceded by any sort of specific injury.  By history, he does have a history of a left-sided knee arthroscopy with partial meniscectomy in the past roughly 25 years ago.    Review of Systems is noted in the HPI, as appropriate  Objective:   There were no vitals taken for this visit.  GEN: No acute distress; alert,appropriate. PULM: Breathing comfortably in no respiratory distress PSYCH: Normally interactive.   Laboratory and Imaging Data:  Assessment and Plan:   ***

## 2022-02-06 ENCOUNTER — Encounter: Payer: Self-pay | Admitting: Family Medicine

## 2022-02-06 ENCOUNTER — Ambulatory Visit (INDEPENDENT_AMBULATORY_CARE_PROVIDER_SITE_OTHER): Payer: Medicare Other | Admitting: Family Medicine

## 2022-02-06 VITALS — BP 140/80 | HR 57 | Temp 98.1°F | Ht 66.0 in | Wt 212.1 lb

## 2022-02-06 DIAGNOSIS — M25562 Pain in left knee: Secondary | ICD-10-CM

## 2022-02-06 DIAGNOSIS — G8929 Other chronic pain: Secondary | ICD-10-CM

## 2022-02-06 DIAGNOSIS — M1712 Unilateral primary osteoarthritis, left knee: Secondary | ICD-10-CM

## 2022-02-19 ENCOUNTER — Telehealth: Payer: Self-pay

## 2022-02-19 NOTE — Telephone Encounter (Signed)
Can you let patient know when ready for pick up.

## 2022-02-19 NOTE — Telephone Encounter (Signed)
I left a vmail for patient that his CD is ready to be picked up from our front office when it is convenient for him.

## 2022-02-19 NOTE — Telephone Encounter (Signed)
Referral coordinator is out.   Routing to MA and XR support to help answer request for imaging.   Referral placed - not sure how to coordinate faxing records

## 2022-02-28 ENCOUNTER — Telehealth (INDEPENDENT_AMBULATORY_CARE_PROVIDER_SITE_OTHER): Payer: Medicare Other | Admitting: Family Medicine

## 2022-02-28 ENCOUNTER — Encounter: Payer: Self-pay | Admitting: Family Medicine

## 2022-02-28 VITALS — Ht 66.0 in | Wt 209.0 lb

## 2022-02-28 DIAGNOSIS — U071 COVID-19: Secondary | ICD-10-CM | POA: Insufficient documentation

## 2022-02-28 MED ORDER — GUAIFENESIN-CODEINE 100-10 MG/5ML PO SYRP: 5.0000 mL | ORAL_SOLUTION | Freq: Every evening | ORAL | 0 refills | Status: DC | PRN

## 2022-02-28 MED ORDER — MOLNUPIRAVIR EUA 200MG CAPSULE
4.0000 | ORAL_CAPSULE | Freq: Two times a day (BID) | ORAL | 0 refills | Status: AC
Start: 2022-02-28 — End: 2022-03-05

## 2022-02-28 NOTE — Progress Notes (Signed)
VIRTUAL VISIT A virtual visit is felt to be most appropriate for this patient at this time.   I connected with the patient on 02/28/22 at  2:00 PM EDT by virtual telehealth platform and verified that I am speaking with the correct person using two identifiers.   I discussed the limitations, risks, security and privacy concerns of performing an evaluation and management service by  virtual telehealth platform and the availability of in person appointments. I also discussed with the patient that there may be a patient responsible charge related to this service. The patient expressed understanding and agreed to proceed.  Patient location: Home Provider Location: [place] Participants: Eliezer Lofts and Kizzie Bane   Chief Complaint  Patient presents with   Covid Positive    Positive home on Monday-Symptoms started on Monday   Sore Throat    Scratchy   Nasal Congestion        Cough   Fever    Broke yesterday    History of Present Illness: 66 year old male  former smoker patient of Dr. Einar Pheasant with history of hypertension presents with COVID 19.  Date of onset: September 12 His initial symptoms started with scratchy throat,dry cough and congestion. His symptoms progressed to headache, body aches, chills, now changed to runny nose. Also fatigued and had episodes where fingertips were numb and blue...30 min, no further.  Yesterday fever 101.85F.   Chest pain with cough. No SOB, no wheeze.  He is treating fever with tylenol , cold and flu. Benadryl.   Vaccines for COVID x 4   GFR 66  COVID 19 screen No recent travel or known exposure to [place]... wife was positive... may have been exposed at different church.   The importance of social distancing was discussed today.   Review of Systems  Constitutional:  Positive for chills, fever and malaise/fatigue.  HENT:  Positive for congestion and sore throat. Negative for ear pain.   Eyes:  Negative for pain and redness.   Respiratory:  Positive for cough. Negative for shortness of breath and wheezing.   Cardiovascular:  Positive for chest pain. Negative for palpitations and leg swelling.  Gastrointestinal:  Negative for abdominal pain, blood in stool, constipation, diarrhea, nausea and vomiting.  Genitourinary:  Negative for dysuria.  Musculoskeletal:  Negative for falls and myalgias.  Skin:  Negative for rash.  Neurological:  Negative for dizziness.  Psychiatric/Behavioral:  Negative for depression. The patient is not nervous/anxious.       Past Medical History:  Diagnosis Date   Arthritis    Colon polyps    GERD (gastroesophageal reflux disease)    Hyperlipidemia    Hypertension    Thyroid disease     reports that he quit smoking about 32 years ago. His smoking use included cigarettes. He has a 18.00 pack-year smoking history. He has never used smokeless tobacco. He reports that he does not currently use alcohol. He reports that he does not use drugs.   Current Outpatient Medications:    allopurinol (ZYLOPRIM) 100 MG tablet, TAKE 1 TABLET(100 MG) BY MOUTH DAILY, Disp: 90 tablet, Rfl: 1   amLODipine (NORVASC) 5 MG tablet, TAKE 1 TABLET(5 MG) BY MOUTH DAILY, Disp: 90 tablet, Rfl: 0   clindamycin-benzoyl peroxide (BENZACLIN) gel, APPLY TOPICALLY TWICE DAILY TO AFFECTED AREA ON FACE, TRUNK, Disp: 50 g, Rfl: 0   colchicine 0.6 MG tablet, TAKE 1 TABLET BY MOUTH DAILY, Disp: 90 tablet, Rfl: 3   meloxicam (MOBIC) 15 MG tablet, Take 1  tablet (15 mg total) by mouth daily., Disp: 30 tablet, Rfl: 0   metoprolol succinate (TOPROL-XL) 100 MG 24 hr tablet, Take 1 tablet (100 mg total) by mouth daily. Take with or immediately following a meal., Disp: 90 tablet, Rfl: 1   naproxen (NAPROSYN) 500 MG tablet, TAKE 1 TABLET(500 MG) BY MOUTH TWICE DAILY WITH A MEAL, Disp: 60 tablet, Rfl: 3   Oxcarbazepine (TRILEPTAL) 300 MG tablet, TAKE 1 TABLET(300 MG) BY MOUTH TWICE DAILY, Disp: 60 tablet, Rfl: 1    Observations/Objective: Height '5\' 6"'$  (1.676 m), weight 209 lb (94.8 kg).  Physical Exam  Physical Exam Constitutional:      General: The patient is not in acute distress. Pulmonary:     Effort: Pulmonary effort is normal. No respiratory distress.  Neurological:     Mental Status: The patient is alert and oriented to person, place, and time.  Psychiatric:        Mood and Affect: Mood normal.        Behavior: Behavior normal.    Assessment and Plan Problem List Items Addressed This Visit     COVID-19 - Primary    COVID19  Infection < 5 days from onset of symptoms in 4 x vaccinated overweight individual with history of HTN  No clear sign of bacterial infection at this time.   No SOB.  No red flags/need for ER visit or in-person exam at respiratory clinic at this time..    Pt higher risk for COVID complications given  HTN and age. GFR  66 but has medication contraindications to paxlovid ( on daily colchicine)  Start molnupiravir 5 day course. Reviewed course of medication and side effect profile with patient in detail.   Symptomatic care with mucinex and cough suppressant at night. If SOB begins symptoms worsening.. have low threshold for in-person exam, if severe shortness of breath ER visit recommended.  Can monitor Oxygen saturation at home with home monitor if able to obtain.  Go to ER if O2 sat < 90% on room air.   Reviewed home care and provided information through [facility] [place].  Recommended quarantine 5 days isolation recommended. Return to work day 6 and wear mask for 4 more days to complete 10 days. Provided info about prevention of spread of COVID 19.      Relevant Medications   molnupiravir EUA (LAGEVRIO) 200 mg CAPS capsule      I discussed the assessment and treatment plan with the patient. The patient was provided an opportunity to ask questions and all were answered. The patient agreed with the plan and demonstrated an understanding of the instructions.   The  patient was advised to call back or seek an in-person evaluation if the symptoms worsen or if the condition fails to improve as anticipated.     Eliezer Lofts, MD

## 2022-02-28 NOTE — Assessment & Plan Note (Signed)
COVID19  Infection < 5 days from onset of symptoms in 4 x vaccinated overweight individual with history of HTN  No clear sign of bacterial infection at this time.   No SOB.  No red flags/need for ER visit or in-person exam at respiratory clinic at this time..    Pt higher risk for COVID complications given  HTN and age. GFR  66 but has medication contraindications to paxlovid ( on daily colchicine)  Start molnupiravir 5 day course. Reviewed course of medication and side effect profile with patient in detail.   Symptomatic care with mucinex and cough suppressant at night. If SOB begins symptoms worsening.. have low threshold for in-person exam, if severe shortness of breath ER visit recommended.  Can monitor Oxygen saturation at home with home monitor if able to obtain.  Go to ER if O2 sat < 90% on room air.   Reviewed home care and provided information through [facility].  Recommended quarantine 5 days isolation recommended. Return to work day 6 and wear mask for 4 more days to complete 10 days. Provided info about prevention of spread of COVID 19.

## 2022-03-14 ENCOUNTER — Encounter: Payer: Self-pay | Admitting: Family Medicine

## 2022-03-14 ENCOUNTER — Ambulatory Visit (INDEPENDENT_AMBULATORY_CARE_PROVIDER_SITE_OTHER): Payer: Medicare Other | Admitting: Family Medicine

## 2022-03-14 VITALS — BP 124/62 | HR 60 | Temp 97.8°F | Ht 66.0 in | Wt 215.0 lb

## 2022-03-14 DIAGNOSIS — R001 Bradycardia, unspecified: Secondary | ICD-10-CM

## 2022-03-14 DIAGNOSIS — I10 Essential (primary) hypertension: Secondary | ICD-10-CM

## 2022-03-14 DIAGNOSIS — D649 Anemia, unspecified: Secondary | ICD-10-CM | POA: Diagnosis not present

## 2022-03-14 DIAGNOSIS — Z23 Encounter for immunization: Secondary | ICD-10-CM

## 2022-03-14 DIAGNOSIS — Z0000 Encounter for general adult medical examination without abnormal findings: Secondary | ICD-10-CM | POA: Diagnosis not present

## 2022-03-14 DIAGNOSIS — R7401 Elevation of levels of liver transaminase levels: Secondary | ICD-10-CM | POA: Diagnosis not present

## 2022-03-14 DIAGNOSIS — R7303 Prediabetes: Secondary | ICD-10-CM | POA: Diagnosis not present

## 2022-03-14 DIAGNOSIS — Z125 Encounter for screening for malignant neoplasm of prostate: Secondary | ICD-10-CM | POA: Diagnosis not present

## 2022-03-14 DIAGNOSIS — E782 Mixed hyperlipidemia: Secondary | ICD-10-CM

## 2022-03-14 LAB — CBC
HCT: 41.4 % (ref 39.0–52.0)
Hemoglobin: 13.4 g/dL (ref 13.0–17.0)
MCHC: 32.4 g/dL (ref 30.0–36.0)
MCV: 84.3 fl (ref 78.0–100.0)
Platelets: 235 10*3/uL (ref 150.0–400.0)
RBC: 4.92 Mil/uL (ref 4.22–5.81)
RDW: 16.8 % — ABNORMAL HIGH (ref 11.5–15.5)
WBC: 3.9 10*3/uL — ABNORMAL LOW (ref 4.0–10.5)

## 2022-03-14 LAB — HEPATIC FUNCTION PANEL
ALT: 66 U/L — ABNORMAL HIGH (ref 0–53)
AST: 47 U/L — ABNORMAL HIGH (ref 0–37)
Albumin: 4.6 g/dL (ref 3.5–5.2)
Alkaline Phosphatase: 82 U/L (ref 39–117)
Bilirubin, Direct: 0.1 mg/dL (ref 0.0–0.3)
Total Bilirubin: 0.6 mg/dL (ref 0.2–1.2)
Total Protein: 7.4 g/dL (ref 6.0–8.3)

## 2022-03-14 LAB — HEMOGLOBIN A1C: Hgb A1c MFr Bld: 6 % (ref 4.6–6.5)

## 2022-03-14 LAB — PSA, MEDICARE: PSA: 0.55 ng/ml (ref 0.10–4.00)

## 2022-03-14 NOTE — Progress Notes (Signed)
Subjective:   Alex Castaneda is a 66 y.o. male who presents for a Welcome to Medicare exam.   Review of Systems: Review of Systems  Constitutional:  Negative for chills and fever.  HENT:  Negative for congestion and sore throat.   Eyes:  Negative for blurred vision and double vision.  Respiratory:  Negative for shortness of breath.   Cardiovascular:  Negative for chest pain.  Gastrointestinal:  Negative for heartburn, nausea and vomiting.  Genitourinary: Negative.   Musculoskeletal: Negative.  Negative for myalgias.  Skin:  Negative for rash.  Neurological:  Negative for dizziness and headaches.  Endo/Heme/Allergies:  Does not bruise/bleed easily.  Psychiatric/Behavioral:  Negative for depression. The patient is not nervous/anxious.     Cardiac Risk Factors include: advanced age (>49mn, >>41women);smoking/ tobacco exposure;obesity (BMI >30kg/m2);male gender;hypertension     Objective:    Today's Vitals   03/14/22 0901  BP: 124/62  Pulse: 60  Temp: 97.8 F (36.6 C)  TempSrc: Oral  SpO2: 99%  Weight: 215 lb (97.5 kg)  Height: '5\' 6"'$  (1.676 m)   Body mass index is 34.7 kg/m.  Medications Outpatient Encounter Medications as of 03/14/2022  Medication Sig   allopurinol (ZYLOPRIM) 100 MG tablet TAKE 1 TABLET(100 MG) BY MOUTH DAILY   amLODipine (NORVASC) 5 MG tablet TAKE 1 TABLET(5 MG) BY MOUTH DAILY   clindamycin-benzoyl peroxide (BENZACLIN) gel APPLY TOPICALLY TWICE DAILY TO AFFECTED AREA ON FACE, TRUNK   colchicine 0.6 MG tablet TAKE 1 TABLET BY MOUTH DAILY   metoprolol succinate (TOPROL-XL) 100 MG 24 hr tablet Take 1 tablet (100 mg total) by mouth daily. Take with or immediately following a meal.   naproxen (NAPROSYN) 500 MG tablet TAKE 1 TABLET(500 MG) BY MOUTH TWICE DAILY WITH A MEAL   Oxcarbazepine (TRILEPTAL) 300 MG tablet TAKE 1 TABLET(300 MG) BY MOUTH TWICE DAILY   meloxicam (MOBIC) 15 MG tablet Take 1 tablet (15 mg total) by mouth daily. (Patient not taking:  Reported on 03/14/2022)   [DISCONTINUED] guaiFENesin-codeine (ROBITUSSIN AC) 100-10 MG/5ML syrup Take 5-10 mLs by mouth at bedtime as needed for cough.   No facility-administered encounter medications on file as of 03/14/2022.     History: Past Medical History:  Diagnosis Date   Arthritis    Colon polyps    GERD (gastroesophageal reflux disease)    Hyperlipidemia    Hypertension    Thyroid disease    Past Surgical History:  Procedure Laterality Date   COLONOSCOPY WITH PROPOFOL N/A 01/01/2021   Procedure: COLONOSCOPY WITH PROPOFOL;  Surgeon: TVirgel Manifold MD;  Location: [facility] ENDOSCOPY;  Service: Endoscopy;  Laterality: N/A;   ELBOW SURGERY Right    HERNIA REPAIR     Umbilical   KNEE SURGERY Left    arthroscopic   ROTATOR CUFF REPAIR Right     Family History  Problem Relation Age of Onset   Throat cancer Mother        smoker   COPD Sister    Cancer Brother        unknown   Arthritis Sister    Diabetes Sister    Diabetes Sister    Stroke Brother [phone]      tobacco use   Social History   Occupational History   Not on file  Tobacco Use   Smoking status: Former    Packs/day: 1.00    Years: 18.00    Total pack years: 18.00    Types: Cigarettes    Quit date: 07/11/1989  Years since quitting: 32.6   Smokeless tobacco: Never  Vaping Use   Vaping Use: Never used  Substance and Sexual Activity   Alcohol use: Not Currently    Comment: 1 drink 1-2 time a month   Drug use: Never   Sexual activity: Not Currently   Tobacco Counseling Counseling given: Not Answered   Immunizations and Health Maintenance Immunization History  Administered Date(s) Administered   Fluad Quad(high Dose 65+) 02/13/2022   Influenza, Seasonal, Injecte, Preservative Fre 03/10/2019   Influenza,inj,Quad PF,6+ Mos 02/22/2020, 03/13/2021   Influenza-Unspecified 02/22/2020   PFIZER(Purple Top)SARS-COV-2 Vaccination 09/15/2019, 10/06/2019, 06/23/2020   PNEUMOCOCCAL CONJUGATE-20 03/14/2022    Pneumococcal Conjugate-13 06/26/2018   Tdap 02/22/2020   Health Maintenance Due  Topic Date Due   Zoster Vaccines- Shingrix (1 of 2) Never done    Activities of Daily Living    03/14/2022    9:22 AM 03/14/2022    9:05 AM  In your present state of health, do you have any difficulty performing the following activities:  Hearing?  0  Vision?  0  Difficulty concentrating or making decisions?  0  Walking or climbing stairs?  0  Dressing or bathing?  0  Doing errands, shopping?  0  Preparing Food and eating ? N   Using the Toilet? N   In the past six months, have you accidently leaked urine? N   Do you have problems with loss of bowel control? N   Managing your Medications? N   Managing your Finances? N   Housekeeping or managing your Housekeeping? N      Physical Exam   Physical Exam Constitutional:      Appearance: Normal appearance. He is not ill-appearing or diaphoretic.  HENT:     Right Ear: External ear normal.     Left Ear: External ear normal.     Nose: Nose normal.  Eyes:     General: No scleral icterus.    Extraocular Movements: Extraocular movements intact.     Conjunctiva/sclera: Conjunctivae normal.  Cardiovascular:     Rate and Rhythm: Normal rate and regular rhythm.     Heart sounds: No murmur heard. Pulmonary:     Effort: Pulmonary effort is normal. No respiratory distress.     Breath sounds: Normal breath sounds. No wheezing.  Musculoskeletal:     Cervical back: Neck supple.  Skin:    General: Skin is warm and dry.  Neurological:     Mental Status: He is alert. Mental status is at baseline.  Psychiatric:        Mood and Affect: Mood normal.    EKG: Sinus bradycardia. ST-elevat  Advanced Directives: Does Patient Have a Medical Advance Directive?: No Would patient like information on creating a medical advance directive?: Yes (MAU/Ambulatory/Procedural Areas - Information given)    Assessment:    This is a routine wellness  examination for  this patient .   Vision/Hearing screen Hearing Screening   '500Hz'$  '1000Hz'$  '3000Hz'$  '5000Hz'$   Right ear '20 20 20 20  '$ Left ear '20 20 20 20   '$ Vision Screening   Right eye Left eye Both eyes  Without correction     With correction '13 13 13    '$ Dietary issues and exercise activities discussed:  Current Exercise Habits: Home exercise routine, Type of exercise: walking;strength training/weights;stretching, Time (Minutes): 45, Frequency (Times/Week): 7, Weekly Exercise (Minutes/Week): 315, Intensity: Moderate, Exercise limited by: orthopedic condition(s)   Goals      Patient Stated  Continue to stay active        Depression Screen    03/14/2022    9:05 AM 06/15/2020    8:01 AM 07/12/2019   11:48 AM  PHQ 2/9 Scores  PHQ - 2 Score 0 0 0  PHQ- 9 Score 0       Fall Risk    03/14/2022    9:05 AM  Fall Risk   Falls in the past year? 0  Number falls in past yr: 0  Injury with Fall? 0    Cognitive Function      Mini-Cog - 03/14/22 0923     Normal clock drawing test? yes    How many words correct? 3                Health Maintenance Due  Topic Date Due   Zoster Vaccines- Shingrix (1 of 2) Never done   Immunization History  Administered Date(s) Administered   Fluad Quad(high Dose 65+) 02/13/2022   Influenza, Seasonal, Injecte, Preservative Fre 03/10/2019   Influenza,inj,Quad PF,6+ Mos 02/22/2020, 03/13/2021   Influenza-Unspecified 02/22/2020   PFIZER(Purple Top)SARS-COV-2 Vaccination 09/15/2019, 10/06/2019, 06/23/2020   PNEUMOCOCCAL CONJUGATE-20 03/14/2022   Pneumococcal Conjugate-13 06/26/2018   Tdap 02/22/2020     Patient Care Team: Lesleigh Noe, MD as PCP - General (Family Medicine) Renee Harder, MD as Consulting Physician (Orthopedic Surgery) Rory Percy, MD as Referring Physician (Dermatology)     Plan:   Problem List Items Addressed This Visit       Cardiovascular and Mediastinum   Hypertension   Relevant Orders   EKG 12-Lead      Other   Hyperlipidemia   Relevant Orders   EKG 12-Lead   Bradycardia    Present for years. Rate 44 on EKG will see if pt would like to see cardiology. Rate 53 on ekg from 2020.       Other Visit Diagnoses     Encounter for Medicare annual wellness exam    -  Primary   Relevant Orders   EKG 12-Lead   Prediabetes       Relevant Orders   Hemoglobin A1c   Anemia, unspecified type       Relevant Orders   CBC   Transaminitis       Relevant Orders   Hepatic Function Panel   Screening for prostate cancer       Relevant Orders   PSA, Medicare   Need for pneumococcal 20-valent conjugate vaccination       Relevant Orders   Pneumococcal conjugate vaccine 20-valent (Prevnar 20) (Completed)        I have personally reviewed and noted the following in the patient's chart:   Medical and social history Use of alcohol, tobacco or illicit drugs  Current medications and supplements Functional ability and status Nutritional status Physical activity Advanced directives List of other physicians Hospitalizations, surgeries, and ER visits in previous 12 months Vitals Screenings to include cognitive, depression, and falls Referrals and appointments  In addition, I have reviewed and discussed with patient certain preventive protocols, quality metrics, and best practice recommendations. A written personalized care plan for preventive services as well as general preventive health recommendations were provided to patient.    Lesleigh Noe, MD 03/14/2022

## 2022-03-14 NOTE — Patient Instructions (Signed)
Labs today  Reassess liver and blood counts  Prostate cancer screening

## 2022-03-14 NOTE — Assessment & Plan Note (Signed)
Present for years. Rate 44 on EKG will see if pt would like to see cardiology. Rate 53 on ekg from 2020.

## 2022-03-15 ENCOUNTER — Encounter: Payer: Self-pay | Admitting: Family Medicine

## 2022-04-01 ENCOUNTER — Other Ambulatory Visit: Payer: Self-pay

## 2022-04-01 MED ORDER — AMLODIPINE BESYLATE 5 MG PO TABS: ORAL_TABLET | ORAL | 0 refills | Status: DC

## 2022-05-02 ENCOUNTER — Ambulatory Visit: Payer: Medicare Other | Attending: Cardiology | Admitting: Cardiology

## 2022-05-02 ENCOUNTER — Encounter: Payer: Self-pay | Admitting: Cardiology

## 2022-05-02 VITALS — BP 130/82 | HR 46 | Ht 67.0 in | Wt 213.4 lb

## 2022-05-02 DIAGNOSIS — I10 Essential (primary) hypertension: Secondary | ICD-10-CM | POA: Diagnosis not present

## 2022-05-02 DIAGNOSIS — R001 Bradycardia, unspecified: Secondary | ICD-10-CM | POA: Diagnosis not present

## 2022-05-02 MED ORDER — METOPROLOL SUCCINATE ER 50 MG PO TB24: 50.0000 mg | ORAL_TABLET | Freq: Every day | ORAL | 1 refills | Status: DC

## 2022-05-02 NOTE — Progress Notes (Signed)
Cardiology Office Note:    Date:  05/02/2022   ID:  Kizzie Bane, DOB 04-01-1956, MRN [id number]  PCP:  Waunita Schooner, MD   [facility] Cardiologist:  Kate Sable, MD     Referring MD: Waunita Schooner, MD   Chief Complaint  Patient presents with   New Patient (Initial Visit)    Ref by Waunita Schooner, MD for bradycardia. Medications reviewed by the patient verbally.     History of Present Illness:    Alex Castaneda is a 65 y.o. male with a hx of hypertension, former smoker x20+ years, who presents due to bradycardia.  Patient saw her primary care provider about 6 weeks ago where EKG showed sinus bradycardia with heart rate 44.  He has been on blood pressure medications for about 3 years now including Toprol-XL 100 mg daily.  He feels well, denies dizziness presyncope or syncope.   Past Medical History:  Diagnosis Date   Arthritis    Colon polyps    GERD (gastroesophageal reflux disease)    Hyperlipidemia    Hypertension    Thyroid disease     Past Surgical History:  Procedure Laterality Date   COLONOSCOPY WITH PROPOFOL N/A 01/01/2021   Procedure: COLONOSCOPY WITH PROPOFOL;  Surgeon: Virgel Manifold, MD;  Location: [facility] ENDOSCOPY;  Service: Endoscopy;  Laterality: N/A;   ELBOW SURGERY Right    HERNIA REPAIR     Umbilical   KNEE SURGERY Left    arthroscopic   ROTATOR CUFF REPAIR Right     Current Medications: Current Meds  Medication Sig   allopurinol (ZYLOPRIM) 100 MG tablet TAKE 1 TABLET(100 MG) BY MOUTH DAILY   amLODipine (NORVASC) 5 MG tablet TAKE 1 TABLET(5 MG) BY MOUTH DAILY   clindamycin-benzoyl peroxide (BENZACLIN) gel APPLY TOPICALLY TWICE DAILY TO AFFECTED AREA ON FACE, TRUNK   colchicine 0.6 MG tablet TAKE 1 TABLET BY MOUTH DAILY   naproxen (NAPROSYN) 500 MG tablet TAKE 1 TABLET(500 MG) BY MOUTH TWICE DAILY WITH A MEAL   Oxcarbazepine (TRILEPTAL) 300 MG tablet TAKE 1 TABLET(300 MG) BY MOUTH TWICE DAILY   [DISCONTINUED]  metoprolol succinate (TOPROL-XL) 100 MG 24 hr tablet Take 1 tablet (100 mg total) by mouth daily. Take with or immediately following a meal.     Allergies:   Naproxen, Nsaids, and Salicylates   Social History   Socioeconomic History   Marital status: Married    Spouse name: Chrys Racer   Number of children: 2   Years of education: Associates degree   Highest education level: Not on file  Occupational History   Not on file  Tobacco Use   Smoking status: Former    Packs/day: 1.00    Years: 18.00    Total pack years: 18.00    Types: Cigarettes    Quit date: 07/11/1989    Years since quitting: 32.8   Smokeless tobacco: Never  Vaping Use   Vaping Use: Never used  Substance and Sexual Activity   Alcohol use: Not Currently    Comment: 1 drink 1-2 time a month   Drug use: Never   Sexual activity: Not Currently  Other Topics Concern   Not on file  Social History Narrative   07/12/19   From: moved from [place] - but from [place] originally    Living: with wife Chrys Racer - 24 years   Work: retired from [employer]: 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, dispensing and [place] - live nearby ([place], [place]) - grandkids - 8 -  and 2 great grandchildren      Enjoys: working on cars, walking, helping and supporting family      Exercise: walking - regularly, hard in the winter   Diet: better now      Safety   Seat belts: Yes    Guns: No   Safe in relationships: Yes    Social Determinants of Radio broadcast assistant Strain: Not on file  Food Insecurity: Not on file  Transportation Needs: Not on file  Physical Activity: Not on file  Stress: Not on file  Social Connections: Not on file     Family History: The patient's family history includes Arthritis in his sister; COPD in his sister; Cancer in his brother; Diabetes in his sister and sister; Stroke (age of onset: 29) in his brother; Throat cancer in his mother.  ROS:   Please see the history of present illness.     All other systems reviewed and are  negative.  EKGs/Labs/Other Studies Reviewed:    The following studies were reviewed today:   EKG:  EKG is  ordered today.  The ekg ordered today demonstrates sinus bradycardia, heart rate 26  Recent Labs: 01/31/2022: BUN 9; Creatinine, Ser 1.15; Potassium 3.9; Sodium 139 03/14/2022: ALT 66; Hemoglobin 13.4; Platelets 235.0  Recent Lipid Panel    Component Value Date/Time   CHOL 168 01/31/2022 1107   TRIG 140.0 01/31/2022 1107   HDL 41.70 01/31/2022 1107   CHOLHDL 4 01/31/2022 1107   VLDL 28.0 01/31/2022 1107   LDLCALC 98 01/31/2022 1107   LDLDIRECT 56.0 01/10/2020 1040     Risk Assessment/Calculations:             Physical Exam:    VS:  BP 130/82 (BP Location: Left Arm, Patient Position: Sitting, Cuff Size: Normal)   Pulse (!) 46   Ht '5\' 7"'$  (1.702 m)   Wt 213 lb 6 oz (96.8 kg)   SpO2 98%   BMI 33.42 kg/m     Wt Readings from Last 3 Encounters:  05/02/22 213 lb 6 oz (96.8 kg)  03/14/22 215 lb (97.5 kg)  02/28/22 209 lb (94.8 kg)     GEN:  Well nourished, well developed in no acute distress HEENT: Normal NECK: No JVD; No carotid bruits CARDIAC: Bradycardic, regular RESPIRATORY:  Clear to auscultation without rales, wheezing or rhonchi  ABDOMEN: Soft, non-tender, non-distended MUSCULOSKELETAL:  No edema; No deformity  SKIN: Warm and dry NEUROLOGIC:  Alert and oriented x 3 PSYCHIATRIC:  Normal affect   ASSESSMENT:    1. Sinus bradycardia   2. Primary hypertension    PLAN:    In order of problems listed above:  Sinus bradycardia, heart rate 46, likely from beta-blocker use.  Reduce Toprol-XL to 50 mg daily.  Consider further titration based on heart rate. Hypertension, continue Norvasc 5 mg daily.  Consider increasing if BP becomes elevated with reducing Toprol-XL above.  Follow-up in 4 to 6 weeks      Medication Adjustments/Labs and Tests Ordered: Current medicines are reviewed at length with the patient today.  Concerns regarding medicines are  outlined above.  Orders Placed This Encounter  Procedures   EKG 12-Lead   Meds ordered this encounter  Medications   metoprolol succinate (TOPROL-XL) 50 MG 24 hr tablet    Sig: Take 1 tablet (50 mg total) by mouth daily. Take with or immediately following a meal.    Dispense:  90 tablet    Refill:  1  Patient Instructions  Medication Instructions:   Your physician has recommended you make the following change in your medication:    DECREASE your Metoprolol Succinate to 50 MG once a day.   *If you need a refill on your cardiac medications before your next appointment, please call your pharmacy*    Follow-Up: At [facility], you and your health needs are our priority.  As part of our continuing mission to provide you with exceptional heart care, we have created designated Provider Care Teams.  These Care Teams include your primary Cardiologist (physician) and Advanced Practice Providers (APPs -  Physician Assistants and Nurse Practitioners) who all work together to provide you with the care you need, when you need it.  We recommend signing up for the patient portal called "MyChart".  Sign up information is provided on this After Visit Summary.  MyChart is used to connect with patients for Virtual Visits (Telemedicine).  Patients are able to view lab/test results, encounter notes, upcoming appointments, etc.  Non-urgent messages can be sent to your provider as well.   To learn more about what you can do with MyChart, go to [email or web address].    Your next appointment:   4-6 week(s)  The format for your next appointment:   In Person  Provider:   Kate Sable, MD    Other Instructions   Important Information About Sugar         Signed, Kate Sable, MD  05/02/2022 10:54 AM    [place]

## 2022-05-02 NOTE — Patient Instructions (Signed)
Medication Instructions:   Your physician has recommended you make the following change in your medication:    DECREASE your Metoprolol Succinate to 50 MG once a day.   *If you need a refill on your cardiac medications before your next appointment, please call your pharmacy*    Follow-Up: At [facility], you and your health needs are our priority.  As part of our continuing mission to provide you with exceptional heart care, we have created designated Provider Care Teams.  These Care Teams include your primary Cardiologist (physician) and Advanced Practice Providers (APPs -  Physician Assistants and Nurse Practitioners) who all work together to provide you with the care you need, when you need it.  We recommend signing up for the patient portal called "MyChart".  Sign up information is provided on this After Visit Summary.  MyChart is used to connect with patients for Virtual Visits (Telemedicine).  Patients are able to view lab/test results, encounter notes, upcoming appointments, etc.  Non-urgent messages can be sent to your provider as well.   To learn more about what you can do with MyChart, go to [email or web address].    Your next appointment:   4-6 week(s)  The format for your next appointment:   In Person  Provider:   Kate Sable, MD    Other Instructions   Important Information About Sugar

## 2022-05-15 ENCOUNTER — Ambulatory Visit (INDEPENDENT_AMBULATORY_CARE_PROVIDER_SITE_OTHER): Payer: Medicare Other | Admitting: Family Medicine

## 2022-05-15 ENCOUNTER — Encounter: Payer: Self-pay | Admitting: Family Medicine

## 2022-05-15 VITALS — BP 116/80 | HR 61 | Temp 97.8°F | Ht 67.0 in | Wt 211.0 lb

## 2022-05-15 DIAGNOSIS — M79672 Pain in left foot: Secondary | ICD-10-CM

## 2022-05-15 DIAGNOSIS — G500 Trigeminal neuralgia: Secondary | ICD-10-CM

## 2022-05-15 DIAGNOSIS — E782 Mixed hyperlipidemia: Secondary | ICD-10-CM

## 2022-05-15 DIAGNOSIS — I10 Essential (primary) hypertension: Secondary | ICD-10-CM | POA: Diagnosis not present

## 2022-05-15 DIAGNOSIS — E559 Vitamin D deficiency, unspecified: Secondary | ICD-10-CM

## 2022-05-15 DIAGNOSIS — K219 Gastro-esophageal reflux disease without esophagitis: Secondary | ICD-10-CM | POA: Diagnosis not present

## 2022-05-15 DIAGNOSIS — L739 Follicular disorder, unspecified: Secondary | ICD-10-CM | POA: Insufficient documentation

## 2022-05-15 DIAGNOSIS — R001 Bradycardia, unspecified: Secondary | ICD-10-CM

## 2022-05-15 DIAGNOSIS — R7989 Other specified abnormal findings of blood chemistry: Secondary | ICD-10-CM | POA: Insufficient documentation

## 2022-05-15 DIAGNOSIS — M1A0720 Idiopathic chronic gout, left ankle and foot, without tophus (tophi): Secondary | ICD-10-CM

## 2022-05-15 DIAGNOSIS — M170 Bilateral primary osteoarthritis of knee: Secondary | ICD-10-CM

## 2022-05-15 DIAGNOSIS — G8929 Other chronic pain: Secondary | ICD-10-CM

## 2022-05-15 HISTORY — DX: Other specified abnormal findings of blood chemistry: R79.89

## 2022-05-15 NOTE — Assessment & Plan Note (Signed)
HX of Bell's Palsy No recent issue.. has not needed prn trileptal.

## 2022-05-15 NOTE — Assessment & Plan Note (Signed)
Bilateral knees. Used meloxicam prn pain...did not help much.  Naprosyn prn helps more.

## 2022-05-15 NOTE — Assessment & Plan Note (Signed)
Occ taking supplement.

## 2022-05-15 NOTE — Assessment & Plan Note (Signed)
Followed by Podiatry in past, no resolved.

## 2022-05-15 NOTE — Progress Notes (Signed)
Patient ID: Alex Castaneda, male    DOB: 12/26/1955, 66 y.o.   MRN: [id number]  This visit was conducted in person.  BP 116/80 (BP Location: Left Arm, Patient Position: Sitting, Cuff Size: Normal)   Pulse 61   Temp 97.8 F (36.6 C) (Temporal)   Ht '5\' 7"'$  (1.702 m)   Wt 211 lb (95.7 kg)   SpO2 98%   BMI 33.05 kg/m    CC:  Chief Complaint  Patient presents with   Transitions Of Care     Subjective:   HPI: Alex Castaneda is a 66 y.o. male presenting on 05/15/2022 for Transitions Of Care  Previous PCP: Alex Castaneda Last  welcome to [facility] 03/14/2022 reviewed OV and labs in detail.  HTN: Well-controlled on amlodipine 5 mg daily, Toprol XL 50 mg daily BP Readings from Last 3 Encounters:  05/15/22 116/80  05/02/22 130/82  03/14/22 124/62    Hyperlipidemia: tolerable control.. calcium CT score 0 Lab Results  Component Value Date   CHOL 168 01/31/2022   HDL 41.70 01/31/2022   LDLCALC 98 01/31/2022   LDLDIRECT 56.0 01/10/2020   TRIG 140.0 01/31/2022   CHOLHDL 4 01/31/2022    Bradycardia, present for years..  She is asymptomatic and it is likely secondary to blood pressure medication side effect.     Chronic gout: only one gout attack... eating low uric acid diet,  on allopurinol 100 mg and colchicine  Wt Readings from Last 3 Encounters:  05/15/22 211 lb (95.7 kg)  05/02/22 213 lb 6 oz (96.8 kg)  03/14/22 215 lb (97.5 kg)   Working on  [employer] habits. Relevant past medical, surgical, family and social history reviewed and updated as indicated. Interim medical history since our last visit reviewed. Allergies and medications reviewed and updated. Outpatient Medications Prior to Visit  Medication Sig Dispense Refill   allopurinol (ZYLOPRIM) 100 MG tablet TAKE 1 TABLET(100 MG) BY MOUTH DAILY 90 tablet 1   amLODipine (NORVASC) 5 MG tablet TAKE 1 TABLET(5 MG) BY MOUTH DAILY 90 tablet 0   clindamycin-benzoyl peroxide (BENZACLIN) gel APPLY TOPICALLY TWICE DAILY TO AFFECTED AREA ON  FACE, TRUNK 50 g 0   colchicine 0.6 MG tablet TAKE 1 TABLET BY MOUTH DAILY 90 tablet 3   metoprolol succinate (TOPROL-XL) 50 MG 24 hr tablet Take 1 tablet (50 mg total) by mouth daily. Take with or immediately following a meal. 90 tablet 1   naproxen (NAPROSYN) 500 MG tablet TAKE 1 TABLET(500 MG) BY MOUTH TWICE DAILY WITH A MEAL 60 tablet 3   Oxcarbazepine (TRILEPTAL) 300 MG tablet TAKE 1 TABLET(300 MG) BY MOUTH TWICE DAILY 60 tablet 1   meloxicam (MOBIC) 15 MG tablet Take 1 tablet (15 mg total) by mouth daily. (Patient not taking: Reported on 05/15/2022) 30 tablet 0   No facility-administered medications prior to visit.     Per HPI unless specifically indicated in ROS section below Review of Systems  Constitutional:  Negative for fatigue and fever.  HENT:  Negative for ear pain.   Eyes:  Negative for pain.  Respiratory:  Negative for cough and shortness of breath.   Cardiovascular:  Negative for chest pain, palpitations and leg swelling.  Gastrointestinal:  Negative for abdominal pain.  Genitourinary:  Negative for dysuria.  Musculoskeletal:  Negative for arthralgias.  Neurological:  Negative for syncope, light-headedness and headaches.  Psychiatric/Behavioral:  Negative for dysphoric mood.    Objective:  BP 116/80 (BP Location: Left Arm, Patient Position: Sitting, Cuff Size: Normal)  Pulse 61   Temp 97.8 F (36.6 C) (Temporal)   Ht '5\' 7"'$  (1.702 m)   Wt 211 lb (95.7 kg)   SpO2 98%   BMI 33.05 kg/m   Wt Readings from Last 3 Encounters:  05/15/22 211 lb (95.7 kg)  05/02/22 213 lb 6 oz (96.8 kg)  03/14/22 215 lb (97.5 kg)      Physical Exam Constitutional:      General: He is not in acute distress.    Appearance: Normal appearance. He is well-developed. He is not ill-appearing or toxic-appearing.  HENT:     Head: Normocephalic and atraumatic.     Right Ear: Hearing, tympanic membrane, ear canal and external ear normal.     Left Ear: Hearing, tympanic membrane, ear canal  and external ear normal.     Nose: Nose normal.     Mouth/Throat:     Pharynx: Uvula midline.  Eyes:     General: Lids are normal. Lids are everted, no foreign bodies appreciated.     Conjunctiva/sclera: Conjunctivae normal.     Pupils: Pupils are equal, round, and reactive to light.  Neck:     Thyroid: No thyroid mass or thyromegaly.     Vascular: No carotid bruit.     Trachea: Trachea and phonation normal.  Cardiovascular:     Rate and Rhythm: Normal rate and regular rhythm.     Pulses: Normal pulses.     Heart sounds: S1 normal and S2 normal. No murmur heard.    No gallop.  Pulmonary:     Breath sounds: Normal breath sounds. No wheezing, rhonchi or rales.  Abdominal:     General: Bowel sounds are normal.     Palpations: Abdomen is soft.     Tenderness: There is no abdominal tenderness. There is no guarding or rebound.     Hernia: No hernia is present.  Musculoskeletal:     Cervical back: Normal range of motion and neck supple.  Lymphadenopathy:     Cervical: No cervical adenopathy.  Skin:    General: Skin is warm and dry.     Findings: No rash.  Neurological:     Mental Status: He is alert.     Cranial Nerves: No cranial nerve deficit.     Sensory: No sensory deficit.     Gait: Gait normal.     Deep Tendon Reflexes: Reflexes are normal and symmetric.  Psychiatric:        Speech: Speech normal.        Behavior: Behavior normal.        Judgment: Judgment normal.       Results for orders placed or performed in visit on 03/14/22  Hepatic Function Panel  Result Value Ref Range   Total Bilirubin 0.6 0.2 - 1.2 mg/dL   Bilirubin, Direct 0.1 0.0 - 0.3 mg/dL   Alkaline Phosphatase 82 39 - 117 U/L   AST 47 (H) 0 - 37 U/L   ALT 66 (H) 0 - 53 U/L   Total Protein 7.4 6.0 - 8.3 g/dL   Albumin 4.6 3.5 - 5.2 g/dL  CBC  Result Value Ref Range   WBC 3.9 (L) 4.0 - 10.5 K/uL   RBC 4.92 4.22 - 5.81 Mil/uL   Platelets 235.0 150.0 - 400.0 K/uL   Hemoglobin 13.4 13.0 - 17.0 g/dL    HCT 41.4 39.0 - 52.0 %   MCV 84.3 78.0 - 100.0 fl   MCHC 32.4 30.0 - 36.0 g/dL   RDW  16.8 (H) 11.5 - 15.5 %  Hemoglobin A1c  Result Value Ref Range   Hgb A1c MFr Bld 6.0 4.6 - 6.5 %  PSA, Medicare  Result Value Ref Range   PSA 0.55 0.10 - 4.00 ng/ml     COVID 19 screen:  No recent travel or known exposure to COVID19 The patient denies respiratory symptoms of COVID 19 at this time. The importance of social distancing was discussed today.   Assessment and Plan Problem List Items Addressed This Visit     Abnormal LFTs   Bradycardia    Chronic, likely secondary to blood pressure medication... saw Cardiology last week... decreased toprol XL.. has follow up  Dec 15th.      Chronic foot pain, left     Followed by Podiatry in past, no resolved.        Chronic gout    Well controlled on allopurinol and colchicine daily.  Last uric acid level 4.5.Marland KitchenMarland Kitchen possible pseudogout.   He would like to try to wean off the colchicine and continue only on allopurinol.. if unable twill restart colchicine till any flare over then consider increasing allopurinol.      Esophageal reflux    Chronic, using prilosec 20 mg daily prn.       Folliculitis     Using clindamycin cream prn flares , on chest.      Hyperlipidemia    LDL at goal, not on statin.  CT calcium score 0 in 2022      Hypertension - Primary    Stable, chronic.  Continue current medication.  amlodipine 5 mg daily, Toprol XL 50 mg daily      Osteoarthritis    Bilateral knees. Used meloxicam prn pain...did not help much.  Naprosyn prn helps more.       Trigeminal neuralgia    HX of Bell's Palsy No recent issue.. has not needed prn trileptal.      Vitamin D deficiency    Occ taking supplement.          Eliezer Lofts, MD

## 2022-05-15 NOTE — Assessment & Plan Note (Signed)
Stable, chronic.  Continue current medication.  amlodipine 5 mg daily, Toprol XL 50 mg daily

## 2022-05-15 NOTE — Assessment & Plan Note (Signed)
LDL at goal, not on statin.  CT calcium score 0 in 2022

## 2022-05-15 NOTE — Assessment & Plan Note (Addendum)
Chronic, likely secondary to blood pressure medication... saw Cardiology last week... decreased toprol XL.. has follow up  Dec 15th.

## 2022-05-15 NOTE — Assessment & Plan Note (Signed)
Chronic, using prilosec 20 mg daily prn.

## 2022-05-15 NOTE — Assessment & Plan Note (Signed)
Using clindamycin cream prn flares , on chest.

## 2022-05-15 NOTE — Assessment & Plan Note (Signed)
Well controlled on allopurinol and colchicine daily.  Last uric acid level 4.5.Marland KitchenMarland Kitchen possible pseudogout.   He would like to try to wean off the colchicine and continue only on allopurinol.. if unable twill restart colchicine till any flare over then consider increasing allopurinol.

## 2022-05-17 ENCOUNTER — Other Ambulatory Visit: Payer: Self-pay | Admitting: Family

## 2022-05-17 ENCOUNTER — Other Ambulatory Visit: Payer: Self-pay | Admitting: Family Medicine

## 2022-05-26 ENCOUNTER — Encounter: Payer: Self-pay | Admitting: Family Medicine

## 2022-05-31 ENCOUNTER — Encounter: Payer: Self-pay | Admitting: Cardiology

## 2022-05-31 ENCOUNTER — Ambulatory Visit: Payer: Medicare Other | Attending: Cardiology | Admitting: Cardiology

## 2022-05-31 VITALS — BP 138/90 | HR 51 | Ht 67.0 in | Wt 209.6 lb

## 2022-05-31 DIAGNOSIS — R001 Bradycardia, unspecified: Secondary | ICD-10-CM | POA: Diagnosis not present

## 2022-05-31 DIAGNOSIS — I10 Essential (primary) hypertension: Secondary | ICD-10-CM | POA: Insufficient documentation

## 2022-05-31 MED ORDER — LOSARTAN POTASSIUM 25 MG PO TABS: 25.0000 mg | ORAL_TABLET | Freq: Every day | ORAL | 3 refills | Status: DC

## 2022-05-31 NOTE — Patient Instructions (Signed)
Medication Instructions:   STOP Metoprolol START Losartan - take one tablet ('25mg'$ ) by mouth daily.   *If you need a refill on your cardiac medications before your next appointment, please call your pharmacy*   Lab Work:  None Ordered  If you have labs (blood work) drawn today and your tests are completely normal, you will receive your results only by: [place] (if you have MyChart) OR A paper copy in the mail If you have any lab test that is abnormal or we need to change your treatment, we will call you to review the results.   Testing/Procedures:  None Ordered   Follow-Up: At [facility], you and your health needs are our priority.  As part of our continuing mission to provide you with exceptional heart care, we have created designated Provider Care Teams.  These Care Teams include your primary Cardiologist (physician) and Advanced Practice Providers (APPs -  Physician Assistants and Nurse Practitioners) who all work together to provide you with the care you need, when you need it.  We recommend signing up for the patient portal called "MyChart".  Sign up information is provided on this After Visit Summary.  MyChart is used to connect with patients for Virtual Visits (Telemedicine).  Patients are able to view lab/test results, encounter notes, upcoming appointments, etc.  Non-urgent messages can be sent to your provider as well.   To learn more about what you can do with MyChart, go to [email or web address].    Your next appointment:   6 week(s)  The format for your next appointment:   In Person  Provider:   You may see Kate Sable, MD or one of the following Advanced Practice Providers on your designated Care Team:   Murray Hodgkins, NP Christell Faith, PA-C Cadence Kathlen Mody, PA-C Gerrie Nordmann, NP

## 2022-05-31 NOTE — Progress Notes (Signed)
Cardiology Office Note:    Date:  05/31/2022   ID:  Alex Castaneda, DOB 19-Apr-1956, MRN [id number]  PCP:  Alex Sanders, MD   [place] [facility] Cardiologist:  Alex Sable, MD     Referring MD: Alex Schooner, MD   No chief complaint on file.   History of Present Illness:    Alex Castaneda is a 66 y.o. male with a hx of hypertension, former smoker x20+ years, who presents for follow-up.  Previously seen due to bradycardia with heart rates in the 40s.  Toprol-XL was decreased to 50 mg daily.  Heart rates have improved to low 50s, blood pressure at home has been normal.  He also takes Norvasc 5 mg daily, compliant with medications as prescribed.  Feels well, has no new concerns at this time.   Past Medical History:  Diagnosis Date   Arthritis    Colon polyps    GERD (gastroesophageal reflux disease)    Hyperlipidemia    Hypertension    Thyroid disease     Past Surgical History:  Procedure Laterality Date   COLONOSCOPY WITH PROPOFOL N/A 01/01/2021   Procedure: COLONOSCOPY WITH PROPOFOL;  Surgeon: Virgel Manifold, MD;  Location: [facility] ENDOSCOPY;  Service: Endoscopy;  Laterality: N/A;   ELBOW SURGERY Right    HERNIA REPAIR     Umbilical   KNEE SURGERY Left    arthroscopic   ROTATOR CUFF REPAIR Right     Current Medications: Current Meds  Medication Sig   allopurinol (ZYLOPRIM) 100 MG tablet TAKE 1 TABLET(100 MG) BY MOUTH DAILY   amLODipine (NORVASC) 5 MG tablet TAKE 1 TABLET(5 MG) BY MOUTH DAILY   clindamycin-benzoyl peroxide (BENZACLIN) gel APPLY TOPICALLY TWICE DAILY TO AFFECTED AREA ON FACE, TRUNK   colchicine 0.6 MG tablet TAKE 1 TABLET BY MOUTH DAILY   losartan (COZAAR) 25 MG tablet Take 1 tablet (25 mg total) by mouth daily.   naproxen (NAPROSYN) 500 MG tablet TAKE 1 TABLET(500 MG) BY MOUTH TWICE DAILY WITH A MEAL   Oxcarbazepine (TRILEPTAL) 300 MG tablet TAKE 1 TABLET(300 MG) BY MOUTH TWICE DAILY   [DISCONTINUED] metoprolol succinate  (TOPROL-XL) 50 MG 24 hr tablet Take 1 tablet (50 mg total) by mouth daily. Take with or immediately following a meal.     Allergies:   Naproxen, Nsaids, and Salicylates   Social History   Socioeconomic History   Marital status: Married    Spouse name: Alex Castaneda   Number of children: 3   Years of education: Associates degree   Highest education level: Not on file  Occupational History   Not on file  Tobacco Use   Smoking status: Former    Packs/day: 1.00    Years: 18.00    Total pack years: 18.00    Types: Cigarettes    Quit date: 07/11/1989    Years since quitting: 32.9   Smokeless tobacco: Never  Vaping Use   Vaping Use: Never used  Substance and Sexual Activity   Alcohol use: Not Currently    Comment: 1 drink 1-2 time a month   Drug use: Never   Sexual activity: Not Currently  Other Topics Concern   Not on file  Social History Narrative   07/12/19   From: moved from [place] - but from [place] originally    Living: with wife Alex Castaneda - 58 years   Work: retired from [employer], Risk analyst      Family: 2 children - Alex Castaneda, Optician, dispensing and Alex Castaneda - live nearby [place],  [place]) - grandkids - 8 - and 2 great grandchildren    5 step children      Enjoys: working on cars, walking, helping and supporting family      Exercise: walking - regularly, hard in the winter   Diet: better now      Safety   Seat belts: Yes    Guns: No   Safe in relationships: Yes    Social Determinants of Radio broadcast assistant Strain: Not on file  Food Insecurity: Not on file  Transportation Needs: Not on file  Physical Activity: Not on file  Stress: Not on file  Social Connections: Not on file     Family History: The patient's family history includes Arthritis in his sister; COPD in his sister; Cancer in his brother; Diabetes in his sister and sister; Stroke (age of onset: 57) in his brother; Throat cancer in his mother.  ROS:   Please see the history of present illness.     All other  systems reviewed and are negative.  EKGs/Labs/Other Studies Reviewed:    The following studies were reviewed today:   EKG:  EKG is  ordered today.  The ekg ordered today demonstrates sinus bradycardia, heart rate 51  Recent Labs: 01/31/2022: BUN 9; Creatinine, Ser 1.15; Potassium 3.9; Sodium 139 03/14/2022: ALT 66; Hemoglobin 13.4; Platelets 235.0  Recent Lipid Panel    Component Value Date/Time   CHOL 168 01/31/2022 1107   TRIG 140.0 01/31/2022 1107   HDL 41.70 01/31/2022 1107   CHOLHDL 4 01/31/2022 1107   VLDL 28.0 01/31/2022 1107   LDLCALC 98 01/31/2022 1107   LDLDIRECT 56.0 01/10/2020 1040     Risk Assessment/Calculations:     Physical Exam:    VS:  BP (!) 138/90   Pulse (!) 51   Ht '5\' 7"'$  (1.702 m)   Wt 209 lb 9.6 oz (95.1 kg)   SpO2 100%   BMI 32.83 kg/m     Wt Readings from Last 3 Encounters:  05/31/22 209 lb 9.6 oz (95.1 kg)  05/15/22 211 lb (95.7 kg)  05/02/22 213 lb 6 oz (96.8 kg)     GEN:  Well nourished, well developed in no acute distress HEENT: Normal NECK: No JVD; No carotid bruits CARDIAC: Bradycardic, regular RESPIRATORY:  Clear to auscultation without rales, wheezing or rhonchi  ABDOMEN: Soft, non-tender, non-distended MUSCULOSKELETAL:  No edema; No deformity  SKIN: Warm and dry NEUROLOGIC:  Alert and oriented x 3 PSYCHIATRIC:  Normal affect   ASSESSMENT:    1. Sinus bradycardia   2. Primary hypertension    PLAN:    In order of problems listed above:  Sinus bradycardia, heart rate improved with reducing beta-blocker, but still low, 51.  Stop Toprol-XL. Hypertension, continue Norvasc 5 mg daily.  Stop Toprol-XL, start losartan 25 mg daily.  Follow-up in  6 weeks      Medication Adjustments/Labs and Tests Ordered: Current medicines are reviewed at length with the patient today.  Concerns regarding medicines are outlined above.  Orders Placed This Encounter  Procedures   EKG 12-Lead   Meds ordered this encounter  Medications    losartan (COZAAR) 25 MG tablet    Sig: Take 1 tablet (25 mg total) by mouth daily.    Dispense:  90 tablet    Refill:  3    Patient Instructions  Medication Instructions:   STOP Metoprolol START Losartan - take one tablet ('25mg'$ ) by mouth daily.   *If you need a refill  on your cardiac medications before your next appointment, please call your pharmacy*   Lab Work:  None Ordered  If you have labs (blood work) drawn today and your tests are completely normal, you will receive your results only by: [place] (if you have MyChart) OR A paper copy in the mail If you have any lab test that is abnormal or we need to change your treatment, we will call you to review the results.   Testing/Procedures:  None Ordered   Follow-Up: At [facility], you and your health needs are our priority.  As part of our continuing mission to provide you with exceptional heart care, we have created designated Provider Care Teams.  These Care Teams include your primary Cardiologist (physician) and Advanced Practice Providers (APPs -  Physician Assistants and Nurse Practitioners) who all work together to provide you with the care you need, when you need it.  We recommend signing up for the patient portal called "MyChart".  Sign up information is provided on this After Visit Summary.  MyChart is used to connect with patients for Virtual Visits (Telemedicine).  Patients are able to view lab/test results, encounter notes, upcoming appointments, etc.  Non-urgent messages can be sent to your provider as well.   To learn more about what you can do with MyChart, go to [email or web address].    Your next appointment:   6 week(s)  The format for your next appointment:   In Person  Provider:   You may see Alex Sable, MD or one of the following Advanced Practice Providers on your designated Care Team:   Murray Hodgkins, NP Christell Faith, PA-C Cadence Kathlen Mody, PA-C Gerrie Nordmann, NP      Signed, Alex Sable, MD  05/31/2022 11:48 AM    [place]

## 2022-06-01 ENCOUNTER — Encounter: Payer: Self-pay | Admitting: Emergency Medicine

## 2022-06-01 ENCOUNTER — Ambulatory Visit
Admission: EM | Admit: 2022-06-01 | Discharge: 2022-06-01 | Disposition: A | Discharge status: Home / Self Care | Payer: Medicare Other | Attending: Emergency Medicine | Admitting: Emergency Medicine

## 2022-06-01 DIAGNOSIS — H6121 Impacted cerumen, right ear: Secondary | ICD-10-CM | POA: Diagnosis not present

## 2022-06-01 NOTE — Discharge Instructions (Addendum)
Rest and drink plenty of fluids Use OTC Debrox to help clean your ear Follow-up with PCP to get referral to an audiologist Continue to use OTC ibuprofen and/ or tylenol as needed for pain control Follow up with PCP if symptoms persists Return here or go to the ER if you have any new or worsening symptoms

## 2022-06-01 NOTE — ED Provider Notes (Signed)
[place]   [id number] 06/01/22 Arrival Time: 28  Chief Complaint  Patient presents with   Ear Injury    Was cleaning my ears 2 days ago. Afterwards it felt like i have an ear plug in it. - Entered by patient     SUBJECTIVE: History from: patient.  Dewain Platz is a 66 y.o. male who presented to the urgent care with a complaint of muffled sound in the right ear for the past 3 days.  Developed the symptoms after cleaning both ear with Q-tips.  He denies pain.Marland Kitchen  Has not tried any OTC medication.  Denies any aggravating factors.  Denies similar symptoms in the past t.    Denies fever, chills, fatigue, sinus pain, rhinorrhea, ear discharge, sore throat, SOB, wheezing, chest pain, nausea, changes in bowel or bladder habits.    ROS: As per HPI.  All other pertinent ROS negative.     Past Medical History:  Diagnosis Date   Arthritis    Colon polyps    GERD (gastroesophageal reflux disease)    Hyperlipidemia    Hypertension    Thyroid disease    Past Surgical History:  Procedure Laterality Date   COLONOSCOPY WITH PROPOFOL N/A 01/01/2021   Procedure: COLONOSCOPY WITH PROPOFOL;  Surgeon: Virgel Manifold, MD;  Location: [facility] ENDOSCOPY;  Service: Endoscopy;  Laterality: N/A;   ELBOW SURGERY Right    HERNIA REPAIR     Umbilical   KNEE SURGERY Left    arthroscopic   ROTATOR CUFF REPAIR Right    Allergies  Allergen Reactions   Naproxen     Other reaction(s): Unknown to Patient//Family   Nsaids     Other reaction(s): Unknown to Patient//Family   Salicylates     Other reaction(s): Unknown to Patient//Family   No current facility-administered medications on file prior to encounter.   Current Outpatient Medications on File Prior to Encounter  Medication Sig Dispense Refill   allopurinol (ZYLOPRIM) 100 MG tablet TAKE 1 TABLET(100 MG) BY MOUTH DAILY 90 tablet 3   amLODipine (NORVASC) 5 MG tablet TAKE 1 TABLET(5 MG) BY MOUTH DAILY 90 tablet 0   clindamycin-benzoyl  peroxide (BENZACLIN) gel APPLY TOPICALLY TWICE DAILY TO AFFECTED AREA ON FACE, TRUNK 50 g 0   colchicine 0.6 MG tablet TAKE 1 TABLET BY MOUTH DAILY 90 tablet 3   losartan (COZAAR) 25 MG tablet Take 1 tablet (25 mg total) by mouth daily. 90 tablet 3   naproxen (NAPROSYN) 500 MG tablet TAKE 1 TABLET(500 MG) BY MOUTH TWICE DAILY WITH A MEAL 60 tablet 3   Oxcarbazepine (TRILEPTAL) 300 MG tablet TAKE 1 TABLET(300 MG) BY MOUTH TWICE DAILY 60 tablet 1   Social History   Socioeconomic History   Marital status: Married    Spouse name: Chrys Racer   Number of children: 3   Years of education: Associates degree   Highest education level: Not on file  Occupational History   Not on file  Tobacco Use   Smoking status: Former    Packs/day: 1.00    Years: 18.00    Total pack years: 18.00    Types: Cigarettes    Quit date: 07/11/1989    Years since quitting: 32.9   Smokeless tobacco: Never  Vaping Use   Vaping Use: Never used  Substance and Sexual Activity   Alcohol use: Not Currently    Comment: 1 drink 1-2 time a month   Drug use: Never   Sexual activity: Not Currently  Other Topics Concern  Not on file  Social History Narrative   07/12/19   From: moved from [place] - but from [place] originally    Living: with wife Chrys Racer - 27 years   Work: retired from [employer], Risk analyst      Family: 2 children - Marissa, Aniceto Boss and Joelene Millin - live nearby [place], [place]) - grandkids - 8 - and 2 great grandchildren    5 step children      Enjoys: working on cars, walking, helping and supporting family      Exercise: walking - regularly, hard in the winter   Diet: better now      Safety   Seat belts: Yes    Guns: No   Safe in relationships: Yes    Social Determinants of Radio broadcast assistant Strain: Not on file  Food Insecurity: Not on file  Transportation Needs: Not on file  Physical Activity: Not on file  Stress: Not on file  Social Connections: Not on file  Intimate Partner  Violence: Not on file   Family History  Problem Relation Age of Onset   Throat cancer Mother        smoker   COPD Sister    Cancer Brother        unknown   Arthritis Sister    Diabetes Sister    Diabetes Sister    Stroke Brother 62       tobacco use    OBJECTIVE:  Vitals:   06/01/22 1524 06/01/22 1526  BP: 134/81   Pulse: (!) 52   Resp: 18   Temp: 98.2 F (36.8 C)   TempSrc: Oral   SpO2: 98%   Weight:  204 lb (92.5 kg)  Height:  '5\' 7"'$  (1.702 m)     Physical Exam Vitals and nursing note reviewed.  Constitutional:      General: He is not in acute distress.    Appearance: Normal appearance. He is normal weight. He is not ill-appearing, toxic-appearing or diaphoretic.  HENT:     Right Ear: There is impacted cerumen. No foreign body.     Left Ear: Hearing and tympanic membrane normal.  Cardiovascular:     Rate and Rhythm: Normal rate and regular rhythm.     Pulses: Normal pulses.     Heart sounds: Normal heart sounds. No murmur heard.    No friction rub. No gallop.  Pulmonary:     Effort: Pulmonary effort is normal. No respiratory distress.     Breath sounds: Normal breath sounds. No stridor. No wheezing, rhonchi or rales.  Chest:     Chest wall: No tenderness.  Neurological:     Mental Status: He is alert and oriented to person, place, and time.      Imaging: No results found.   ASSESSMENT & PLAN:  1. Impacted cerumen of right ear     No orders of the defined types were placed in this encounter.  Discharge instructions  Rest and drink plenty of fluids Use OTC Debrox to help clean your ear Follow-up with PCP to get referral to an audiologist Continue to use OTC ibuprofen and/ or tylenol as needed for pain control Follow up with PCP if symptoms persists Return here or go to the ER if you have any new or worsening symptoms   Reviewed expectations re: course of current medical issues. Questions answered. Outlined signs and symptoms indicating need  for more acute intervention. Patient verbalized understanding. After Visit Summary given.  Emerson Monte, [place] 06/01/22 1555

## 2022-06-01 NOTE — ED Triage Notes (Signed)
Patient states that he was cleaning both ears with q-tips and now he is having "muffled hearing" from his right ear x 3 days.

## 2022-06-03 ENCOUNTER — Encounter: Payer: Self-pay | Admitting: Family Medicine

## 2022-06-19 ENCOUNTER — Ambulatory Visit: Payer: Federal, State, Local not specified - PPO | Admitting: Dermatology

## 2022-06-27 ENCOUNTER — Other Ambulatory Visit: Payer: Self-pay | Admitting: Family Medicine

## 2022-06-27 DIAGNOSIS — M1A0720 Idiopathic chronic gout, left ankle and foot, without tophus (tophi): Secondary | ICD-10-CM

## 2022-06-27 NOTE — Telephone Encounter (Signed)
3 Month lab scheduled.

## 2022-07-01 ENCOUNTER — Encounter: Payer: Self-pay | Admitting: Family Medicine

## 2022-07-01 MED ORDER — AMLODIPINE BESYLATE 5 MG PO TABS: ORAL_TABLET | ORAL | 2 refills | Status: DC

## 2022-07-10 ENCOUNTER — Encounter: Payer: Self-pay | Admitting: Family Medicine

## 2022-07-12 ENCOUNTER — Ambulatory Visit: Payer: Federal, State, Local not specified - PPO | Attending: Cardiology | Admitting: Cardiology

## 2022-08-05 ENCOUNTER — Encounter: Payer: Self-pay | Admitting: Family Medicine

## 2022-08-06 ENCOUNTER — Other Ambulatory Visit: Payer: Self-pay | Admitting: Family Medicine

## 2022-08-06 DIAGNOSIS — Z87891 Personal history of nicotine dependence: Secondary | ICD-10-CM

## 2022-08-07 ENCOUNTER — Encounter: Payer: Self-pay | Admitting: Cardiology

## 2022-08-07 ENCOUNTER — Ambulatory Visit: Payer: Medicare Other | Attending: Cardiology | Admitting: Cardiology

## 2022-08-07 VITALS — BP 138/88 | HR 65 | Ht 67.0 in | Wt 213.8 lb

## 2022-08-07 DIAGNOSIS — I10 Essential (primary) hypertension: Secondary | ICD-10-CM

## 2022-08-07 DIAGNOSIS — R001 Bradycardia, unspecified: Secondary | ICD-10-CM

## 2022-08-07 NOTE — Progress Notes (Signed)
Cardiology Office Note:    Date:  08/07/2022   ID:  Alex Castaneda, DOB [birth year], MRN [id number]  PCP:  Jinny Sanders, MD   [place] Prov[facility] Cardiologist:  Kate Sable, MD     Referring MD: Jinny Sanders, MD   Chief Complaint  Patient presents with   Follow-up    HTN F/u, no new cardiac concerns, reports home blood pressures are good    History of Present Illness:    Alex Castaneda is a 67 y.o. male with a hx of hypertension, former smoker x20+ years, who presents for follow-up.    Previously seen due to bradycardia with heart rates in the 40s.  Was on Toprol-XL, this was stopped with improvement in heart rates.  Losartan was started for BP control.  He also takes Norvasc.  Blood pressure is well-controlled at home, feels well, denies any adverse effects.  Past Medical History:  Diagnosis Date   Arthritis    Colon polyps    GERD (gastroesophageal reflux disease)    Hyperlipidemia    Hypertension    Thyroid disease     Past Surgical History:  Procedure Laterality Date   COLONOSCOPY WITH PROPOFOL N/A 01/01/2021   Procedure: COLONOSCOPY WITH PROPOFOL;  Surgeon: Virgel Manifold, MD;  Location: [facility] ENDOSCOPY;  Service: Endoscopy;  Laterality: N/A;   ELBOW SURGERY Right    HERNIA REPAIR     Umbilical   KNEE SURGERY Left    arthroscopic   ROTATOR CUFF REPAIR Right     Current Medications: Current Meds  Medication Sig   amLODipine (NORVASC) 5 MG tablet TAKE 1 TABLET(5 MG) BY MOUTH DAILY   clindamycin-benzoyl peroxide (BENZACLIN) gel APPLY TOPICALLY TWICE DAILY TO AFFECTED AREA ON FACE, TRUNK   losartan (COZAAR) 25 MG tablet Take 1 tablet (25 mg total) by mouth daily.   naproxen (NAPROSYN) 500 MG tablet TAKE 1 TABLET(500 MG) BY MOUTH TWICE DAILY WITH A MEAL   Oxcarbazepine (TRILEPTAL) 300 MG tablet TAKE 1 TABLET(300 MG) BY MOUTH TWICE DAILY     Allergies:   Naproxen, Nsaids, and Salicylates   Social History   Socioeconomic History    Marital status: Married    Spouse name: Alex Castaneda   Number of children: 3   Years of education: Associates degree   Highest education level: Not on file  Occupational History   Not on file  Tobacco Use   Smoking status: Former    Packs/day: 1.00    Years: 18.00    Total pack years: 18.00    Types: Cigarettes    Quit date: 07/11/1989    Years since quitting: 33.0   Smokeless tobacco: Never  Vaping Use   Vaping Use: Never used  Substance and Sexual Activity   Alcohol use: Not Currently    Comment: 1 drink 1-2 time a month   Drug use: Never   Sexual activity: Not Currently  Other Topics Concern   Not on file  Social History Narrative   07/12/19   From: moved from [place] - but from [place] originally    Living: with wife Alex Castaneda - 66 years   Work: retired from [employer], Risk analyst      Family: 2 children - Marissa, Optician, dispensing and Sp[place] - live nearby [place], [place] - 8 - and 2 great grandchildren    5 step children      Enjoys: working on cars, walking, helping and supporting family      Exercise: walking - regularly, hard  in the winter   Diet: better now      Safety   Seat belts: Yes    Guns: No   Safe in relationships: Yes    Social Determinants of Radio broadcast assistant Strain: Not on file  Food Insecurity: Not on file  Transportation Needs: Not on file  Physical Activity: Not on file  Stress: Not on file  Social Connections: Not on file     Family History: The patient's family history includes Arthritis in his sister; COPD in his sister; Cancer in his brother; Diabetes in his sister and sister; Stroke (age of onset: 71) in his brother; Throat cancer in his mother.  ROS:   Please see the history of present illness.     All other systems reviewed and are negative.  EKGs/Labs/Other Studies Reviewed:    The following studies were reviewed today:   EKG:  EKG not ordered today.   Recent Labs: 01/31/2022: BUN 9; Creatinine, Ser 1.15;  Potassium 3.9; Sodium 139 03/14/2022: ALT 66; Hemoglobin 13.4; Platelets 235.0  Recent Lipid Panel    Component Value Date/Time   CHOL 168 01/31/2022 1107   TRIG 140.0 01/31/2022 1107   HDL 41.70 01/31/2022 1107   CHOLHDL 4 01/31/2022 1107   VLDL 28.0 01/31/2022 1107   LDLCALC 98 01/31/2022 1107   LDLDIRECT 56.0 01/10/2020 1040     Risk Assessment/Calculations:     Physical Exam:    VS:  BP 138/88 (BP Location: Left Arm, Patient Position: Sitting, Cuff Size: Normal)   Pulse 65   Ht 5' 7"$  (1.702 m)   Wt 213 lb 12.8 oz (97 kg)   SpO2 97%   BMI 33.49 kg/m     Wt Readings from Last 3 Encounters:  08/07/22 213 lb 12.8 oz (97 kg)  06/01/22 204 lb (92.5 kg)  05/31/22 209 lb 9.6 oz (95.1 kg)     GEN:  Well nourished, well developed in no acute distress HEENT: Normal NECK: No JVD; No carotid bruits CARDIAC: Regular rate and rhythm RESPIRATORY:  Clear to auscultation without rales, wheezing or rhonchi  ABDOMEN: Soft, non-tender, non-distended MUSCULOSKELETAL:  No edema; No deformity  SKIN: Warm and dry NEUROLOGIC:  Alert and oriented x 3 PSYCHIATRIC:  Normal affect   ASSESSMENT:    1. Sinus bradycardia   2. Primary hypertension    PLAN:    In order of problems listed above:  Sinus bradycardia, resolved with stopping Toprol-XL.   Hypertension, controlled.  Continue Norvasc, losartan 25 mg.  Low-salt diet, increase activity emphasized.  Follow-up as needed.     Medication Adjustments/Labs and Tests Ordered: Current medicines are reviewed at length with the patient today.  Concerns regarding medicines are outlined above.  No orders of the defined types were placed in this encounter.  No orders of the defined types were placed in this encounter.   Patient Instructions  Medication Instructions:   Your physician recommends that you continue on your current medications as directed. Please refer to the Current Medication list given to you today.  *If you need a  refill on your cardiac medications before your next appointment, please call your pharmacy*   Lab Work:  None Ordered  If you have labs (blood work) drawn today and your tests are completely normal, you will receive your results only by: [place] (if you have MyChart) OR A paper copy in the mail If you have any lab test that is abnormal or we need to change your treatment,  we will call you to review the results.   Testing/Procedures:  None Ordered   Follow-Up: At [facility], you and your health needs are our priority.  As part of our continuing mission to provide you with exceptional heart care, we have created designated Provider Care Teams.  These Care Teams include your primary Cardiologist (physician) and Advanced Practice Providers (APPs -  Physician Assistants and Nurse Practitioners) who all work together to provide you with the care you need, when you need it.  We recommend signing up for the patient portal called "MyChart".  Sign up information is provided on this After Visit Summary.  MyChart is used to connect with patients for Virtual Visits (Telemedicine).  Patients are able to view lab/test results, encounter notes, upcoming appointments, etc.  Non-urgent messages can be sent to your provider as well.   To learn more about what you can do with MyChart, go to [email or web address].    Your next appointment:    AS NEEDED   Signed, Kate Sable, MD  08/07/2022 9:17 AM    [place]

## 2022-08-07 NOTE — Patient Instructions (Signed)
Medication Instructions:   Your physician recommends that you continue on your current medications as directed. Please refer to the Current Medication list given to you today.  *If you need a refill on your cardiac medications before your next appointment, please call your pharmacy*   Lab Work:  None Ordered  If you have labs (blood work) drawn today and your tests are completely normal, you will receive your results only by: [place] (if you have MyChart) OR A paper copy in the mail If you have any lab test that is abnormal or we need to change your treatment, we will call you to review the results.   Testing/Procedures:  None Ordered   Follow-Up: At [facility], you and your health needs are our priority.  As part of our continuing mission to provide you with exceptional heart care, we have created designated Provider Care Teams.  These Care Teams include your primary Cardiologist (physician) and Advanced Practice Providers (APPs -  Physician Assistants and Nurse Practitioners) who all work together to provide you with the care you need, when you need it.  We recommend signing up for the patient portal called "MyChart".  Sign up information is provided on this After Visit Summary.  MyChart is used to connect with patients for Virtual Visits (Telemedicine).  Patients are able to view lab/test results, encounter notes, upcoming appointments, etc.  Non-urgent messages can be sent to your provider as well.   To learn more about what you can do with MyChart, go to [email or web address].    Your next appointment:    AS NEEDED

## 2022-08-27 IMAGING — MR MR HEAD WO/W CM
14 series · 46 of 48 positions shown · IV contrast (gadavist)
Comparison: None.

CLINICAL DATA: Intense headache.

EXAM:
MRI HEAD WITHOUT AND WITH CONTRAST
TECHNIQUE: Multiplanar, multiecho pulse sequences of the brain and surrounding
structures were obtained without and with intravenous contrast.
CONTRAST:  9mL GADAVIST GADOBUTROL 1 MMOL/ML IV SOLN

[Series 5: ax dwi_tracew · axial · 3.0mm · 0.60mm/px · z∈[-117,+37]mm · 4 of 48 slices shown]
[im 1/48]
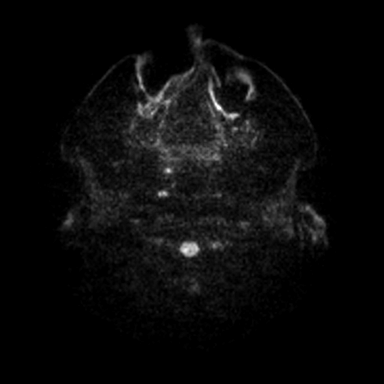
[im 16/48]
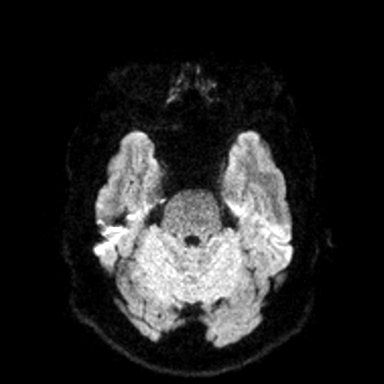
[im 32/48]
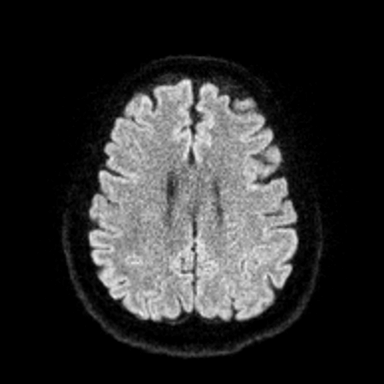
[im 48/48]
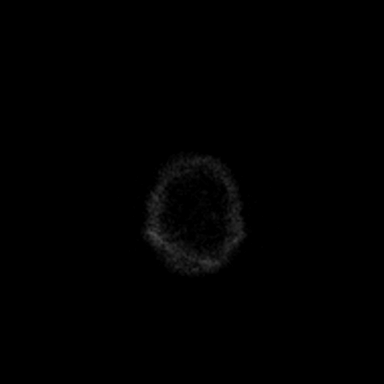

[Series 6: ax dwi_adc · axial · 3.0mm · 0.60mm/px · z∈[-117,+37]mm · 3 of 48 slices shown]
[im 1/48]
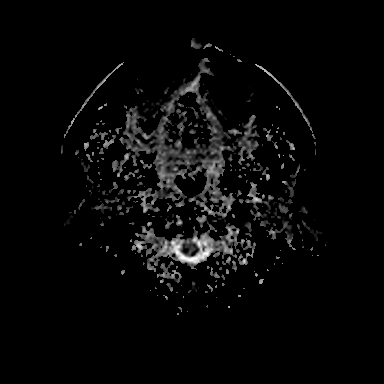
[im 24/48]
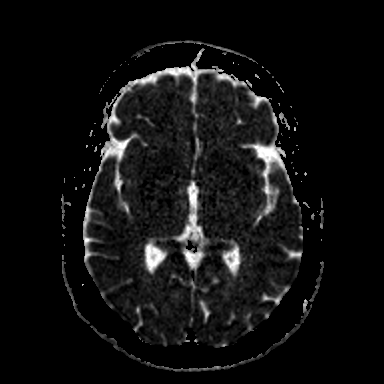
[im 48/48]
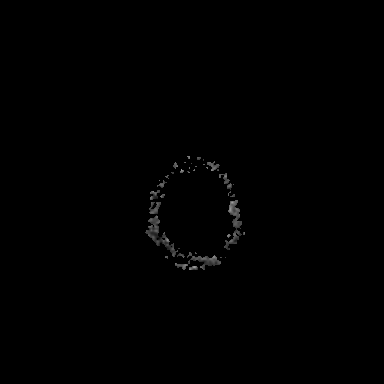

[Series 7: cor dwi_tracew · coronal · 5.0mm · 0.60mm/px · 2 of 40 slices shown]
[im 1/40]
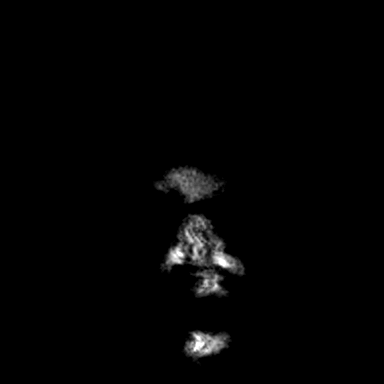
[im 40/40]
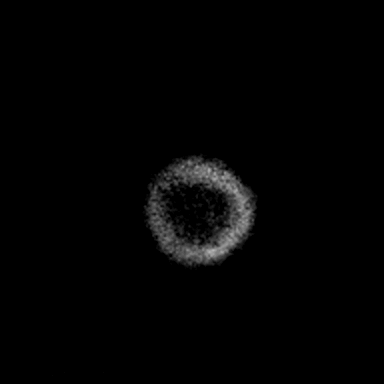

[Series 8: cor dwi_adc · coronal · 5.0mm · 0.60mm/px · 2 of 40 slices shown]
[im 1/40]
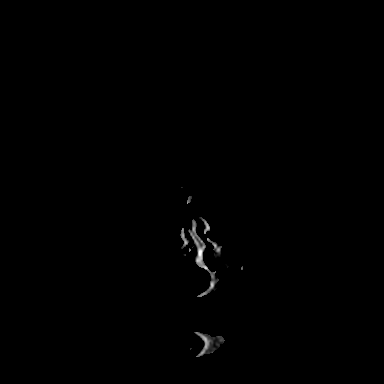
[im 40/40]
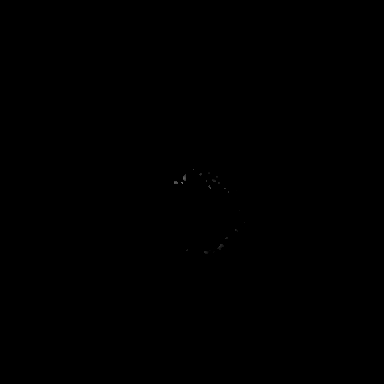

[Series 9: T1 · sagittal · 5.0mm · 0.62mm/px · 1 of 25 slices shown (1 of 2)]
[im 1/25]
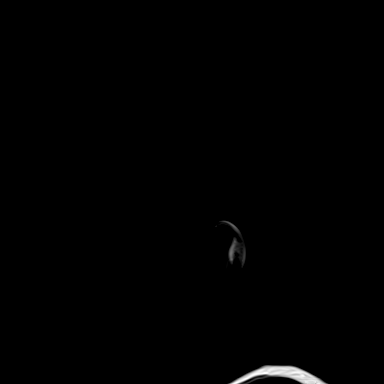

[Series 10: T2 · axial · 5.0mm · 0.53mm/px · z∈[-117,+38]mm · 2 of 27 slices shown (1 of 2)]
[im 1/27]
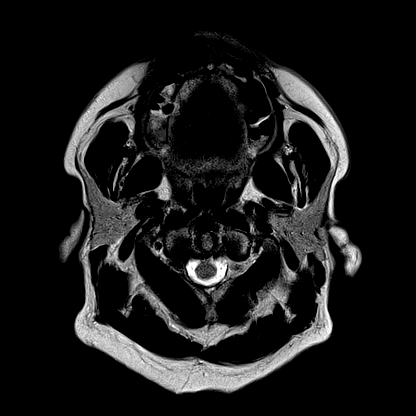
[im 27/27]
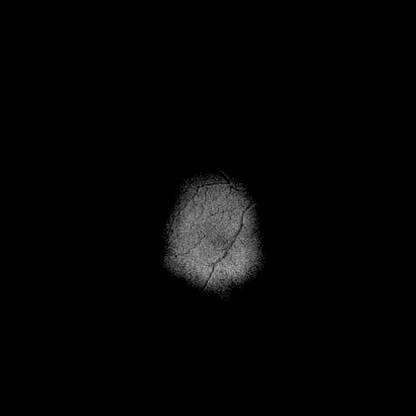

[Series 12: pha_images · axial · 3.0mm · 0.90mm/px · z∈[-127,+49]mm · 3 of 60 slices shown]
[im 1/60]
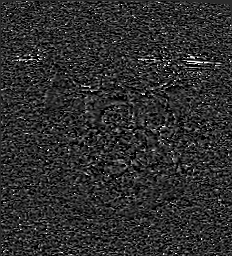
[im 30/60]
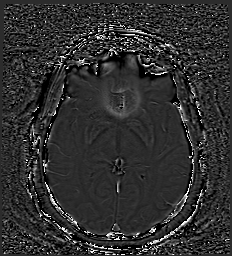
[im 60/60]
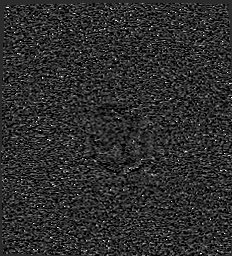

[Series 13: swi_images · axial · 3.0mm · 0.90mm/px · z∈[-127,+49]mm · 3 of 60 slices shown]
[im 1/60]
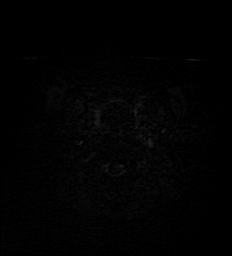
[im 30/60]
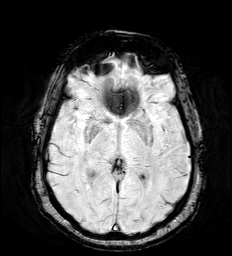
[im 60/60]
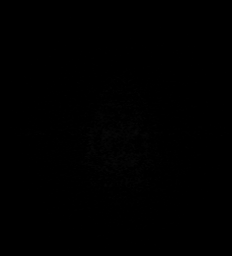

[Series 15: FLAIR · axial · 3.0mm · 0.53mm/px · z∈[-120,+41]mm · 3 of 55 slices shown]
[im 1/55]
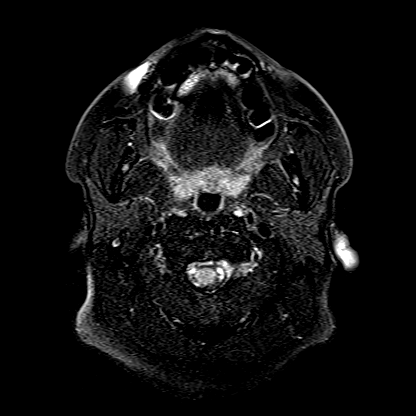
[im 28/55]
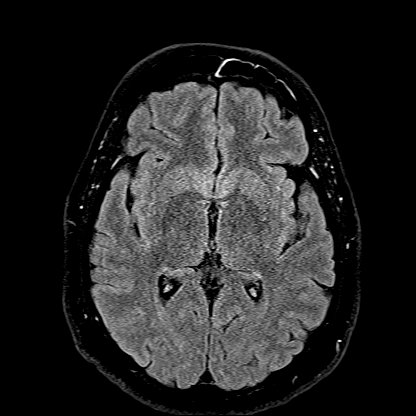
[im 55/55]
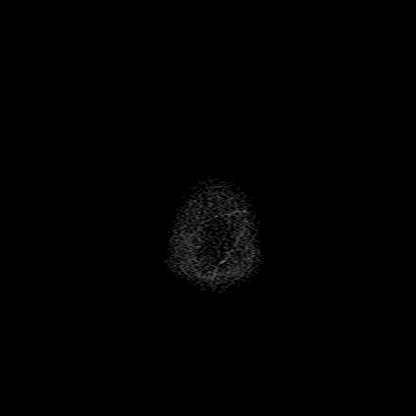

[Series 16: T1 · axial · 1.0mm · 0.98mm/px · z∈[-127,+47]mm · 8 of 176 slices shown (2 of 2)]
[im 1/176]
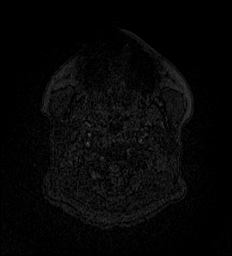
[im 20/176]
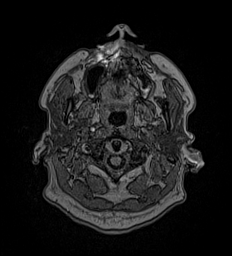
[im 59/176]
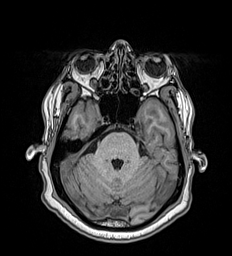
[im 78/176]
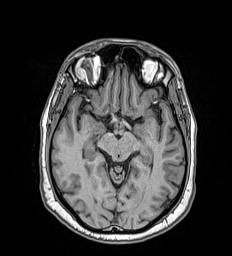
[im 98/176]
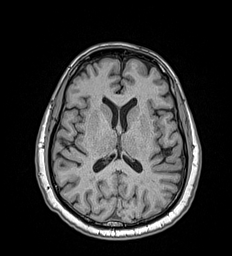
[im 117/176]
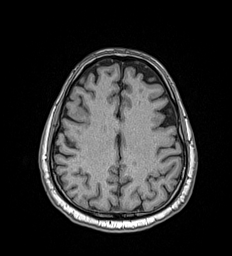
[im 156/176]
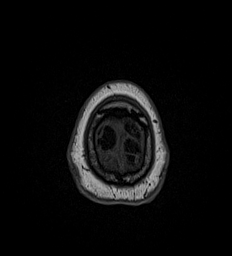
[im 176/176]
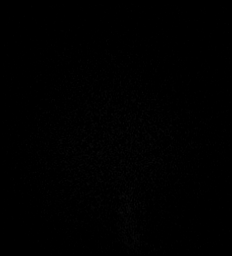

[Series 17: T1 post-contrast · axial · 1.0mm · 0.98mm/px · z∈[-127,+47]mm · 10 of 176 slices shown (1 of 3)]
[im 1/176]
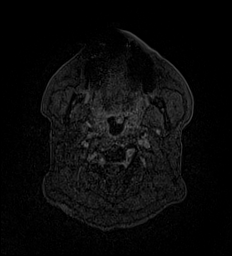
[im 20/176]
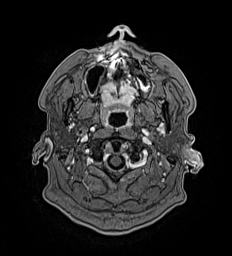
[im 39/176]
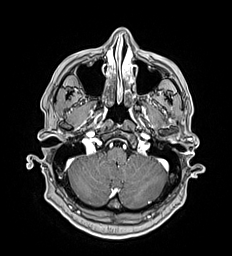
[im 59/176]
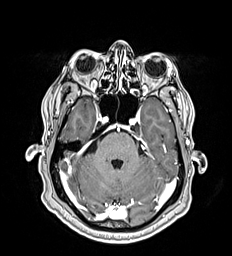
[im 78/176]
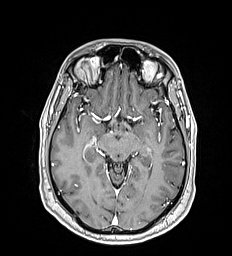
[im 98/176]
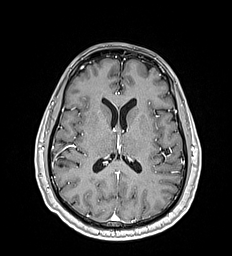
[im 117/176]
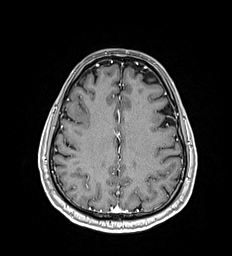
[im 137/176]
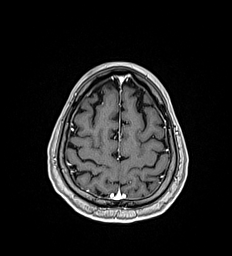
[im 156/176]
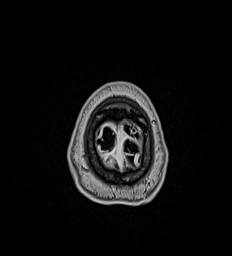
[im 176/176]
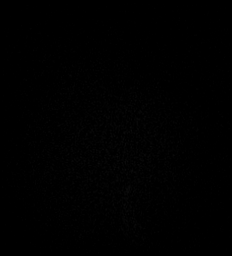

[Series 18: T2 · coronal · 5.0mm · 0.45mm/px · 2 of 30 slices shown (2 of 2)]
[im 1/30]
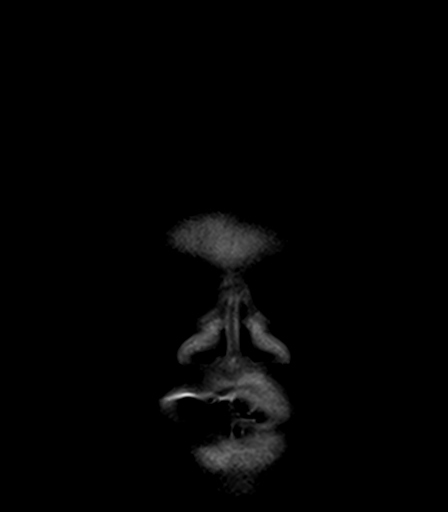
[im 30/30]
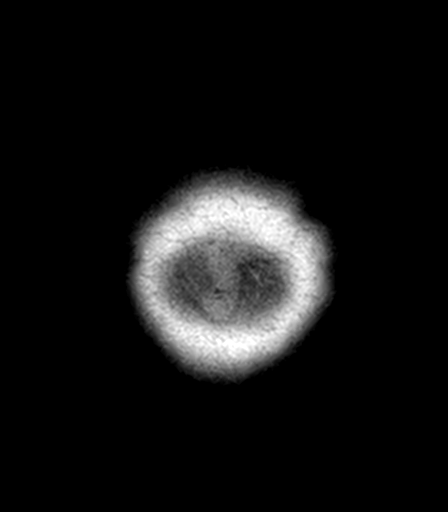

[Series 19: T1 post-contrast · coronal · 5.0mm · 0.57mm/px · 2 of 30 slices shown (2 of 3)]
[im 1/30]
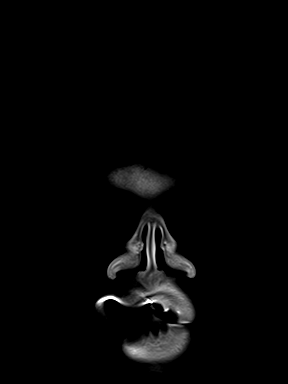
[im 30/30]
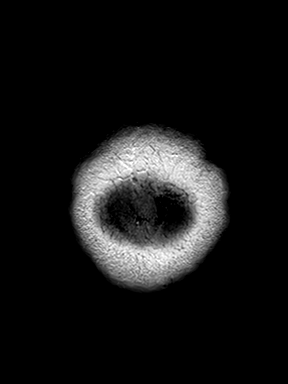

[Series 20: T1 post-contrast · sagittal · 5.0mm · 0.62mm/px · 1 of 25 slices shown (3 of 3)]
[im 1/25]
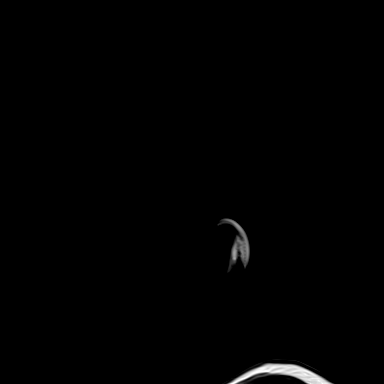

[46 of 48 positions shown; findings below may reference images not displayed]

FINDINGS: Brain: No infarction, hemorrhage, hydrocephalus, extra-axial
collection or mass lesion. Minor FLAIR hyperintensity in the
biatrial white matter, less than typical for age. Normal brain
volume. No abnormal intracranial enhancement

Vascular: Normal flow voids.

Skull and upper cervical spine: Normal marrow signal.

Sinuses/Orbits: Mild-to-moderate mucosal thickening in paranasal
sinuses, greatest and frontal and ethmoid air cells.
IMPRESSION: 1. Unremarkable appearance of the brain.
2. Mild to moderate mucosal thickening in the paranasal sinuses.

## 2022-10-01 ENCOUNTER — Other Ambulatory Visit (INDEPENDENT_AMBULATORY_CARE_PROVIDER_SITE_OTHER): Payer: Medicare Other

## 2022-10-01 DIAGNOSIS — M1A0720 Idiopathic chronic gout, left ankle and foot, without tophus (tophi): Secondary | ICD-10-CM | POA: Diagnosis not present

## 2022-10-01 LAB — URIC ACID: Uric Acid, Serum: 5.6 mg/dL (ref 4.0–7.8)

## 2022-10-22 ENCOUNTER — Other Ambulatory Visit: Payer: Self-pay

## 2022-10-22 DIAGNOSIS — Z87891 Personal history of nicotine dependence: Secondary | ICD-10-CM

## 2022-11-15 ENCOUNTER — Ambulatory Visit (INDEPENDENT_AMBULATORY_CARE_PROVIDER_SITE_OTHER): Payer: Medicare Other | Admitting: Physician Assistant

## 2022-11-15 ENCOUNTER — Encounter: Payer: Self-pay | Admitting: Physician Assistant

## 2022-11-15 DIAGNOSIS — Z87891 Personal history of nicotine dependence: Secondary | ICD-10-CM

## 2022-11-15 NOTE — Patient Instructions (Signed)
Thank you for participating in the [place] [facility]. It was our pleasure to meet you today. We will call you with the results of your scan within the next few days. Your scan will be assigned a Lung RADS category score by the physicians reading the scans.  This Lung RADS score determines follow up scanning.  See below for description of categories, and follow up screening recommendations. We will be in touch to schedule your follow up screening annually or based on recommendations of our providers. We will fax a copy of your scan results to your Primary Care Physician, or the physician who referred you to the program, to ensure they have the results. Please call the office if you have any questions or concerns regarding your scanning experience or results.  Our office number is [phone]. Please speak with Denise Phelps, RN. , or  Denise Buckner RN, They are  our Lung Cancer Screening RN.'s If They are unavailable when you call, Please leave a message on the voice mail. We will return your call at our earliest convenience.This voice mail is monitored several times a day.  Remember, if your scan is normal, we will scan you annually as long as you continue to meet the criteria for the program. (Age 50-80, Current smoker or smoker who has quit within the last 15 years). If you are a smoker, remember, quitting is the single most powerful action that you can take to decrease your risk of lung cancer and other pulmonary, breathing related problems. We know quitting is hard, and we are here to help.  Please let us know if there is anything we can do to help you meet your goal of quitting. If you are a former smoker, congratulations. We are proud of you! Remain smoke free! Remember you can refer friends or family members through the number above.  We will screen them to make sure they meet criteria for the program. Thank you for helping us take better care of you by  participating in Lung Screening.  You can receive free nicotine replacement therapy ( patches, gum or mints) by calling 1-800-QUIT NOW. Please call so we can get you on the path to becoming  a non-smoker. I know it is hard, but you can do this!  Lung RADS Categories:  Lung RADS 1: no nodules or definitely non-concerning nodules.  Recommendation is for a repeat annual scan in 12 months.  Lung RADS 2:  nodules that are non-concerning in appearance and behavior with a very low likelihood of becoming an active cancer. Recommendation is for a repeat annual scan in 12 months.  Lung RADS 3: nodules that are probably non-concerning , includes nodules with a low likelihood of becoming an active cancer.  Recommendation is for a 6-month repeat screening scan. Often noted after an upper respiratory illness. We will be in touch to make sure you have no questions, and to schedule your 6-month scan.  Lung RADS 4 A: nodules with concerning findings, recommendation is most often for a follow up scan in 3 months or additional testing based on our provider's assessment of the scan. We will be in touch to make sure you have no questions and to schedule the recommended 3 month follow up scan.  Lung RADS 4 B:  indicates findings that are concerning. We will be in touch with you to schedule additional diagnostic testing based on our provider's  assessment of the scan.  Other options for assistance in smoking cessation (   As covered by your insurance benefits)  Hypnosis for smoking cessation  [employer] [phone]  Acupuncture for smoking cessation  [facility] [phone]   

## 2022-11-15 NOTE — Progress Notes (Signed)
Virtual Visit via Telephone Note  I connected with Alex Castaneda on 11/15/22 at 10:30 AM EDT by telephone and verified that I am speaking with the correct person using two identifiers.  Location: Patient: home Provider: working virtually from home   I discussed the limitations, risks, security and privacy concerns of performing an evaluation and management service by telephone and the availability of in person appointments. I also discussed with the patient that there may be a patient responsible charge related to this service. The patient expressed understanding and agreed to proceed.     Shared Decision Making Visit Lung Cancer Screening Program [id number])   Eligibility: Age 67 y.o. Pack Years Smoking History Calculation 41 (# packs/per year x # years smoked) Recent History of coughing up blood  no Unexplained weight loss? no ( >Than 15 pounds within the last 6 months ) Prior History Lung / other cancer no (Diagnosis within the last 5 years already requiring surveillance chest CT Scans). Smoking Status Former Smoker Former Smokers: Years since quit: 9 years  Quit Date: 2013  Visit Components: Discussion included one or more decision making aids. yes Discussion included risk/benefits of screening. yes Discussion included potential follow up diagnostic testing for abnormal scans. yes Discussion included meaning and risk of over diagnosis. yes Discussion included meaning and risk of False Positives. yes Discussion included meaning of total radiation exposure. yes  Counseling Included: Importance of adherence to annual lung cancer LDCT screening. yes Impact of comorbidities on ability to participate in the program. yes Ability and willingness to under diagnostic treatment. yes  Smoking Cessation Counseling: Former Smokers:  Discussed the importance of maintaining cigarette abstinence. yes Diagnosis Code: Personal History of Nicotine Dependence. U04.540 Information about  tobacco cessation classes and interventions provided to patient. Yes Written Order for Lung Cancer Screening with LDCT placed in Epic. Yes (CT Chest Lung Cancer Screening Low Dose W/O CM) [id number] Z12.2-Screening of respiratory organs Z87.891-Personal history of nicotine dependence  I spent 25 minutes of face to face time/virtual visit time  with the patient discussing the risks and benefits of lung cancer screening. We took the time to pause at intervals to allow for questions to be asked and answered to ensure understanding. We discussed that he had taken the single most powerful action possible to decrease his risk of developing lung cancer when he quit smoking. I counseled him to remain smoke free, and to contact the office if he ever had the desire to smoke again so that I can provide resources and tools to help support the effort to remain smoke free. We discussed the time and location of the scan, and that either  Alex Miyamoto RN, Alex Lemon, RN or I  or I will call / send a letter with the results within  24-72 hours of receiving them. He has the office contact information in the event he has questions,  he verbalized understanding of all of the above and had no further questions upon leaving the office.     I explained to the patient that there has been a high incidence of coronary artery disease noted on these exams. I explained that this is a non-gated exam therefore degree or severity cannot be determined. This patient is on statin therapy. I have asked the patient to follow-up with their PCP regarding any incidental finding of coronary artery disease and management with diet or medication as they feel is clinically indicated. The patient verbalized understanding of the above and had no further questions.  Alex Castaneda Alex Grob, PA-C

## 2022-11-19 ENCOUNTER — Ambulatory Visit
Admission: RE | Admit: 2022-11-19 | Discharge: 2022-11-19 | Disposition: A | Discharge status: Home / Self Care | Payer: Medicare Other | Source: Ambulatory Visit | Attending: Family Medicine | Admitting: Family Medicine

## 2022-11-19 DIAGNOSIS — Z87891 Personal history of nicotine dependence: Secondary | ICD-10-CM

## 2022-11-26 ENCOUNTER — Other Ambulatory Visit: Payer: Self-pay

## 2022-11-26 DIAGNOSIS — Z87891 Personal history of nicotine dependence: Secondary | ICD-10-CM

## 2022-12-23 ENCOUNTER — Encounter: Payer: Self-pay | Admitting: Family Medicine

## 2023-02-28 ENCOUNTER — Telehealth: Payer: Self-pay | Admitting: *Deleted

## 2023-02-28 DIAGNOSIS — D649 Anemia, unspecified: Secondary | ICD-10-CM

## 2023-02-28 DIAGNOSIS — R7989 Other specified abnormal findings of blood chemistry: Secondary | ICD-10-CM

## 2023-02-28 DIAGNOSIS — R7303 Prediabetes: Secondary | ICD-10-CM

## 2023-02-28 DIAGNOSIS — E782 Mixed hyperlipidemia: Secondary | ICD-10-CM

## 2023-02-28 DIAGNOSIS — Z125 Encounter for screening for malignant neoplasm of prostate: Secondary | ICD-10-CM

## 2023-02-28 NOTE — Telephone Encounter (Signed)
-----   Message from Lovena Neighbours sent at 02/28/2023 11:28 AM EDT ----- Regarding: Labs for Wednesday 9.25.24 Please put physical lab orders in future. Thank you, Denny Peon

## 2023-03-05 ENCOUNTER — Ambulatory Visit (INDEPENDENT_AMBULATORY_CARE_PROVIDER_SITE_OTHER): Payer: Medicare Other | Admitting: Family Medicine

## 2023-03-05 ENCOUNTER — Encounter: Payer: Self-pay | Admitting: Family Medicine

## 2023-03-05 VITALS — BP 112/70 | HR 64 | Temp 98.2°F | Ht 67.0 in | Wt 207.0 lb

## 2023-03-05 DIAGNOSIS — R3912 Poor urinary stream: Secondary | ICD-10-CM | POA: Insufficient documentation

## 2023-03-05 DIAGNOSIS — N401 Enlarged prostate with lower urinary tract symptoms: Secondary | ICD-10-CM

## 2023-03-05 DIAGNOSIS — R3911 Hesitancy of micturition: Secondary | ICD-10-CM | POA: Diagnosis not present

## 2023-03-05 HISTORY — DX: Benign prostatic hyperplasia with lower urinary tract symptoms: N40.1

## 2023-03-05 HISTORY — DX: Benign prostatic hyperplasia with lower urinary tract symptoms: R39.11

## 2023-03-05 LAB — POC URINALSYSI DIPSTICK (AUTOMATED)
Bilirubin, UA: NEGATIVE
Blood, UA: NEGATIVE
Glucose, UA: NEGATIVE
Ketones, UA: NEGATIVE
Leukocytes, UA: NEGATIVE
Nitrite, UA: NEGATIVE
Protein, UA: NEGATIVE
Spec Grav, UA: 1.015 (ref 1.010–1.025)
Urobilinogen, UA: 0.2 U/dL
pH, UA: 6 (ref 5.0–8.0)

## 2023-03-05 MED ORDER — TAMSULOSIN HCL 0.4 MG PO CAPS: 0.4000 mg | ORAL_CAPSULE | Freq: Every day | ORAL | 3 refills | Status: DC

## 2023-03-05 NOTE — Assessment & Plan Note (Signed)
Subacute/chronic Urinalysis negative and prostate nontender suggesting against prostate infection. Evidence of moderately enlarged prostate as likely cause of lower urinary tract symptoms including hesitancy, Decreased flow and dribbling. No nodules palpated, will check PSA with upcoming labs for physical as planned. Start Flomax 4 mg p.o. daily for BPH.

## 2023-03-05 NOTE — Progress Notes (Signed)
Patient ID: Alex Castaneda, male    DOB: [birth year], 67 y.o.   MRN: [id number]  This visit was conducted in person.  BP 112/70 (BP Location: Left Arm, Patient Position: Sitting, Cuff Size: Normal)   Pulse 64   Temp 98.2 F (36.8 C) (Temporal)   Ht 5\' 7"  (1.702 m)   Wt 207 lb (93.9 kg)   SpO2 100%   BMI 32.42 kg/m    CC:  Chief Complaint  Patient presents with   prostate issues    And urinary issues. Patient states that his stream is weak and has a hesitation when he goes to urinate x 1 month. States this does happen every time he tries to go.     Subjective:   HPI: Alex Castaneda is a 67 y.o. male presenting on 03/05/2023 for prostate issues (And urinary issues. Patient states that his stream is weak and has a hesitation when he goes to urinate x 1 month. States this does happen every time he tries to go. )    In last 1-2 month he has noted   dribbling after urination.  Gradually worsened, has noted delay in voiding, weaker stream.   No change in frequency or urgency.  No dysuria.  No blood in urine.  Occ mild soreness over lower central suprapubic region.  No soreness in perineum.  No fever, no flank pain.   New med.. gabapentin and meloxicam for back pain.  No history of prostatitis or BPH.   No family history of prostate cancer except cousins.   Lab Results  Component Value Date   PSA 0.55 03/14/2022   PSA 0.76 03/27/2021   PSA 0.8 12/24/2018        Relevant past medical, surgical, family and social history reviewed and updated as indicated. Interim medical history since our last visit reviewed. Allergies and medications reviewed and updated. Outpatient Medications Prior to Visit  Medication Sig Dispense Refill   amLODipine (NORVASC) 5 MG tablet TAKE 1 TABLET(5 MG) BY MOUTH DAILY 90 tablet 2   clindamycin-benzoyl peroxide (BENZACLIN) gel APPLY TOPICALLY TWICE DAILY TO AFFECTED AREA ON FACE, TRUNK 50 g 0   losartan (COZAAR) 25 MG tablet Take 1 tablet (25 mg  total) by mouth daily. 90 tablet 3   allopurinol (ZYLOPRIM) 100 MG tablet TAKE 1 TABLET(100 MG) BY MOUTH DAILY 90 tablet 3   colchicine 0.6 MG tablet TAKE 1 TABLET BY MOUTH DAILY 90 tablet 3   naproxen (NAPROSYN) 500 MG tablet TAKE 1 TABLET(500 MG) BY MOUTH TWICE DAILY WITH A MEAL 60 tablet 3   Oxcarbazepine (TRILEPTAL) 300 MG tablet TAKE 1 TABLET(300 MG) BY MOUTH TWICE DAILY 60 tablet 1   No facility-administered medications prior to visit.     Per HPI unless specifically indicated in ROS section below Review of Systems  Constitutional:  Negative for fatigue and fever.  HENT:  Negative for ear pain.   Eyes:  Negative for pain.  Respiratory:  Negative for cough and shortness of breath.   Cardiovascular:  Negative for chest pain, palpitations and leg swelling.  Gastrointestinal:  Negative for abdominal pain.  Genitourinary:  Positive for decreased urine volume. Negative for dysuria, flank pain, frequency, genital sores, hematuria, penile discharge, penile pain, penile swelling, scrotal swelling, testicular pain and urgency.  Musculoskeletal:  Negative for arthralgias.  Neurological:  Negative for syncope, light-headedness and headaches.  Psychiatric/Behavioral:  Negative for dysphoric mood.    Objective:  BP 112/70 (BP Location: Left Arm, Patient Position: Sitting, Cuff  Size: Normal)   Pulse 64   Temp 98.2 F (36.8 C) (Temporal)   Ht 5\' 7"  (1.702 m)   Wt 207 lb (93.9 kg)   SpO2 100%   BMI 32.42 kg/m   Wt Readings from Last 3 Encounters:  03/05/23 207 lb (93.9 kg)  08/07/22 213 lb 12.8 oz (97 kg)  06/01/22 204 lb (92.5 kg)      Physical Exam Exam conducted with a chaperone present.  Constitutional:      Appearance: He is well-developed.  HENT:     Head: Normocephalic.     Right Ear: Hearing normal.     Left Ear: Hearing normal.     Nose: Nose normal.  Neck:     Thyroid: No thyroid mass or thyromegaly.     Vascular: No carotid bruit.     Trachea: Trachea normal.   Cardiovascular:     Rate and Rhythm: Normal rate and regular rhythm.     Pulses: Normal pulses.     Heart sounds: Heart sounds not distant. No murmur heard.    No friction rub. No gallop.     Comments: No peripheral edema Pulmonary:     Effort: Pulmonary effort is normal. No respiratory distress.     Breath sounds: Normal breath sounds.  Genitourinary:    Prostate: Enlarged. Not tender and no nodules present.     Rectum: No tenderness, anal fissure, external hemorrhoid or internal hemorrhoid. Normal anal tone.  Skin:    General: Skin is warm and dry.     Findings: No rash.  Psychiatric:        Speech: Speech normal.        Behavior: Behavior normal.        Thought Content: Thought content normal.       Results for orders placed or performed in visit on 03/05/23  POCT Urinalysis Dipstick (Automated)  Result Value Ref Range   Color, UA yellow    Clarity, UA clear    Glucose, UA Negative Negative   Bilirubin, UA neg    Ketones, UA neg    Spec Grav, UA 1.015 1.010 - 1.025   Blood, UA neg    pH, UA 6.0 5.0 - 8.0   Protein, UA Negative Negative   Urobilinogen, UA 0.2 0.2 or 1.0 E.U./dL   Nitrite, UA neg    Leukocytes, UA Negative Negative    Assessment and Plan  Weak urinary stream -     POCT Urinalysis Dipstick (Automated)  Benign prostatic hyperplasia with urinary hesitancy Assessment & Plan: Subacute/chronic Urinalysis negative and prostate nontender suggesting against prostate infection. Evidence of moderately enlarged prostate as likely cause of lower urinary tract symptoms including hesitancy, Decreased flow and dribbling. No nodules palpated, will check PSA with upcoming labs for physical as planned. Start Flomax 4 mg p.o. daily for BPH.   Other orders -     Tamsulosin HCl; Take 1 capsule (0.4 mg total) by mouth daily.  Dispense: 30 capsule; Refill: 3    No follow-ups on file.   Kerby Nora, MD

## 2023-03-12 ENCOUNTER — Other Ambulatory Visit (INDEPENDENT_AMBULATORY_CARE_PROVIDER_SITE_OTHER): Payer: Medicare Other

## 2023-03-12 DIAGNOSIS — R7989 Other specified abnormal findings of blood chemistry: Secondary | ICD-10-CM

## 2023-03-12 DIAGNOSIS — D649 Anemia, unspecified: Secondary | ICD-10-CM

## 2023-03-12 DIAGNOSIS — E782 Mixed hyperlipidemia: Secondary | ICD-10-CM | POA: Diagnosis not present

## 2023-03-12 DIAGNOSIS — Z125 Encounter for screening for malignant neoplasm of prostate: Secondary | ICD-10-CM

## 2023-03-12 DIAGNOSIS — R7303 Prediabetes: Secondary | ICD-10-CM | POA: Diagnosis not present

## 2023-03-12 LAB — CBC WITH DIFFERENTIAL/PLATELET
Basophils Absolute: 0.1 10*3/uL (ref 0.0–0.1)
Basophils Relative: 1.3 % (ref 0.0–3.0)
Eosinophils Absolute: 0.3 10*3/uL (ref 0.0–0.7)
Eosinophils Relative: 7.8 % — ABNORMAL HIGH (ref 0.0–5.0)
HCT: 39.5 % (ref 39.0–52.0)
Hemoglobin: 12.6 g/dL — ABNORMAL LOW (ref 13.0–17.0)
Lymphocytes Relative: 49.3 % — ABNORMAL HIGH (ref 12.0–46.0)
Lymphs Abs: 2 10*3/uL (ref 0.7–4.0)
MCHC: 31.9 g/dL (ref 30.0–36.0)
MCV: 82.6 fl (ref 78.0–100.0)
Monocytes Absolute: 0.4 10*3/uL (ref 0.1–1.0)
Monocytes Relative: 10.5 % (ref 3.0–12.0)
Neutro Abs: 1.2 10*3/uL — ABNORMAL LOW (ref 1.4–7.7)
Neutrophils Relative %: 31.1 % — ABNORMAL LOW (ref 43.0–77.0)
Platelets: 271 10*3/uL (ref 150.0–400.0)
RBC: 4.78 Mil/uL (ref 4.22–5.81)
RDW: 15.7 % — ABNORMAL HIGH (ref 11.5–15.5)
WBC: 4 10*3/uL (ref 4.0–10.5)

## 2023-03-12 LAB — COMPREHENSIVE METABOLIC PANEL
ALT: 28 U/L (ref 0–53)
AST: 26 U/L (ref 0–37)
Albumin: 4.3 g/dL (ref 3.5–5.2)
Alkaline Phosphatase: 73 U/L (ref 39–117)
BUN: 6 mg/dL (ref 6–23)
CO2: 27 mEq/L (ref 19–32)
Calcium: 9.5 mg/dL (ref 8.4–10.5)
Chloride: 107 mEq/L (ref 96–112)
Creatinine, Ser: 1.1 mg/dL (ref 0.40–1.50)
GFR: 70.08 mL/min (ref >60.00)
Glucose, Bld: 95 mg/dL (ref 70–99)
Potassium: 3.8 mEq/L (ref 3.5–5.1)
Sodium: 141 mEq/L (ref 135–145)
Total Bilirubin: 0.6 mg/dL (ref 0.2–1.2)
Total Protein: 6.9 g/dL (ref 6.0–8.3)

## 2023-03-12 LAB — PSA: PSA: 1.06 ng/mL (ref 0.10–4.00)

## 2023-03-12 LAB — HEMOGLOBIN A1C: Hgb A1c MFr Bld: 5.6 % (ref 4.6–6.5)

## 2023-03-12 LAB — LIPID PANEL
Cholesterol: 178 mg/dL (ref 0–200)
HDL: 52.4 mg/dL (ref >39.00)
LDL Cholesterol: 109 mg/dL — ABNORMAL HIGH (ref 0–99)
NonHDL: 125.63
Total CHOL/HDL Ratio: 3
Triglycerides: 85 mg/dL (ref 0.0–149.0)
VLDL: 17 mg/dL (ref 0.0–40.0)

## 2023-03-13 NOTE — Progress Notes (Signed)
No critical labs need to be addressed urgently. We will discuss labs in detail at upcoming office visit.   

## 2023-03-19 ENCOUNTER — Encounter: Payer: Self-pay | Admitting: Family Medicine

## 2023-03-19 ENCOUNTER — Ambulatory Visit: Payer: Medicare Other | Admitting: Family Medicine

## 2023-03-19 VITALS — BP 118/80 | HR 78 | Temp 98.2°F | Ht 67.0 in | Wt 207.0 lb

## 2023-03-19 DIAGNOSIS — N401 Enlarged prostate with lower urinary tract symptoms: Secondary | ICD-10-CM

## 2023-03-19 DIAGNOSIS — R7989 Other specified abnormal findings of blood chemistry: Secondary | ICD-10-CM

## 2023-03-19 DIAGNOSIS — R972 Elevated prostate specific antigen [PSA]: Secondary | ICD-10-CM

## 2023-03-19 DIAGNOSIS — E782 Mixed hyperlipidemia: Secondary | ICD-10-CM

## 2023-03-19 DIAGNOSIS — I10 Essential (primary) hypertension: Secondary | ICD-10-CM | POA: Diagnosis not present

## 2023-03-19 DIAGNOSIS — D649 Anemia, unspecified: Secondary | ICD-10-CM

## 2023-03-19 DIAGNOSIS — Z0000 Encounter for general adult medical examination without abnormal findings: Secondary | ICD-10-CM

## 2023-03-19 DIAGNOSIS — R3911 Hesitancy of micturition: Secondary | ICD-10-CM

## 2023-03-19 MED ORDER — MELOXICAM 7.5 MG PO TABS: 7.5000 mg | ORAL_TABLET | Freq: Every day | ORAL | 3 refills | Status: DC

## 2023-03-19 NOTE — Progress Notes (Signed)
Patient ID: Cardin Malsom, male    DOB: Jun 09, 1956, 67 y.o.   MRN: [id number]  This visit was conducted in person.  BP 118/80 (BP Location: Left Arm, Patient Position: Sitting, Cuff Size: Normal)   Pulse 78   Temp 98.2 F (36.8 C) (Oral)   Ht 5\' 7"  (1.702 m)   Wt 207 lb (93.9 kg)   SpO2 98%   BMI 32.42 kg/m    CC:  Chief Complaint  Patient presents with   Medicare Wellness     Subjective:   HPI: Quamere Lemas is a 67 y.o. male presenting on 03/19/2023 for Medicare Wellness  The patient presents for annual medicare wellness, complete physical and review of chronic health problems. He/She also has the following acute concerns today:  I have personally reviewed the Medicare Annual Wellness questionnaire and have noted 1. The patient's medical and social history 2. Their use of alcohol, tobacco or illicit drugs 3. Their current medications and supplements 4. The patient's functional ability including ADL's, fall risks, home safety risks and hearing or visual             impairment. 5. Diet and physical activities 6. Evidence for depression or mood disorders 7.         Updated provider list Cognitive evaluation was performed and recorded on pt medicare questionnaire form. The patients weight, height, BMI and visual acuity have been recorded in the chart   I have made referrals, counseling and provided education to the patient based review of the above and I have provided the pt with a written personalized care plan for preventive services.   Documentation of this information was scanned into the electronic record under the media tab.   Screening hearing and vision documented in EPIC by medical assistant.  No falls in last 12 months.  Flowsheet Row Office Visit from 03/19/2023 in [facility]  PHQ-2 Total Score 0      BPH: weak stream, decreased flow... 2 weeks ago started on flomax 0.4 mg daily. He feels taking this later in the day has  helped some with increasing flow, more consistent flow.  Still some low abd cramping.    HTN: Well-controlled on amlodipine 5 mg daily, Toprol XL 50 mg daily BP Readings from Last 3 Encounters:  03/19/23 118/80  03/05/23 112/70  08/07/22 138/88    Hyperlipidemia: tolerable control.. calcium CT score 0 He has been eating more eggs in last year.  Walking  and exercsie 2-3 times a week. Lab Results  Component Value Date   CHOL 178 03/12/2023   HDL 52.40 03/12/2023   LDLCALC 109 (H) 03/12/2023   LDLDIRECT 56.0 01/10/2020   TRIG 85.0 03/12/2023   CHOLHDL 3 03/12/2023  The 10-year ASCVD risk score (Arnett DK, et al., 2019) is: 13.8%   Values used to calculate the score:     Age: 5 years     Sex: Male     Is Non-Hispanic African American: Yes     Diabetic: No     Tobacco smoker: No     Systolic Blood Pressure: 118 mmHg     Is BP treated: Yes     HDL Cholesterol: 52.4 mg/dL     Total Cholesterol: 178 mg/dL   Bradycardia, present for years.Marland Kitchen  He is asymptomatic and it is likely secondary to blood pressure medication side effect.     Chronic gout: only one gout attack... eating low uric acid diet,  on allopurinol  100 mg and colchicine  Wt Readings from Last 3 Encounters:  03/19/23 207 lb (93.9 kg)  03/05/23 207 lb (93.9 kg)  08/07/22 213 lb 12.8 oz (97 kg)   Relevant past medical, surgical, family and social history reviewed and updated as indicated. Interim medical history since our last visit reviewed. Allergies and medications reviewed and updated. Outpatient Medications Prior to Visit  Medication Sig Dispense Refill   amLODipine (NORVASC) 5 MG tablet TAKE 1 TABLET(5 MG) BY MOUTH DAILY 90 tablet 2   clindamycin-benzoyl peroxide (BENZACLIN) gel APPLY TOPICALLY TWICE DAILY TO AFFECTED AREA ON FACE, TRUNK 50 g 0   losartan (COZAAR) 25 MG tablet Take 1 tablet (25 mg total) by mouth daily. 90 tablet 3   tamsulosin (FLOMAX) 0.4 MG CAPS capsule Take 1 capsule (0.4 mg total) by  mouth daily. 30 capsule 3   meloxicam (MOBIC) 7.5 MG tablet Take 7.5 mg by mouth daily.     No facility-administered medications prior to visit.     Per HPI unless specifically indicated in ROS section below Review of Systems  Constitutional:  Negative for fatigue and fever.  HENT:  Negative for ear pain.   Eyes:  Negative for pain.  Respiratory:  Negative for cough and shortness of breath.   Cardiovascular:  Negative for chest pain, palpitations and leg swelling.  Gastrointestinal:  Negative for abdominal pain.  Genitourinary:  Negative for dysuria.  Musculoskeletal:  Negative for arthralgias.  Neurological:  Negative for syncope, light-headedness and headaches.  Psychiatric/Behavioral:  Negative for dysphoric mood.    Objective:  BP 118/80 (BP Location: Left Arm, Patient Position: Sitting, Cuff Size: Normal)   Pulse 78   Temp 98.2 F (36.8 C) (Oral)   Ht 5\' 7"  (1.702 m)   Wt 207 lb (93.9 kg)   SpO2 98%   BMI 32.42 kg/m   Wt Readings from Last 3 Encounters:  03/19/23 207 lb (93.9 kg)  03/05/23 207 lb (93.9 kg)  08/07/22 213 lb 12.8 oz (97 kg)      Physical Exam Constitutional:      General: He is not in acute distress.    Appearance: Normal appearance. He is well-developed. He is not ill-appearing or toxic-appearing.  HENT:     Head: Normocephalic and atraumatic.     Right Ear: Hearing, tympanic membrane, ear canal and external ear normal.     Left Ear: Hearing, tympanic membrane, ear canal and external ear normal.     Nose: Nose normal.     Mouth/Throat:     Pharynx: Uvula midline.  Eyes:     General: Lids are normal. Lids are everted, no foreign bodies appreciated.     Conjunctiva/sclera: Conjunctivae normal.     Pupils: Pupils are equal, round, and reactive to light.  Neck:     Thyroid: No thyroid mass or thyromegaly.     Vascular: No carotid bruit.     Trachea: Trachea and phonation normal.  Cardiovascular:     Rate and Rhythm: Normal rate and regular  rhythm.     Pulses: Normal pulses.     Heart sounds: S1 normal and S2 normal. No murmur heard.    No gallop.  Pulmonary:     Breath sounds: Normal breath sounds. No wheezing, rhonchi or rales.  Abdominal:     General: Bowel sounds are normal.     Palpations: Abdomen is soft.     Tenderness: There is no abdominal tenderness. There is no guarding or rebound.  Hernia: No hernia is present.  Musculoskeletal:     Cervical back: Normal range of motion and neck supple.  Lymphadenopathy:     Cervical: No cervical adenopathy.  Skin:    General: Skin is warm and dry.     Findings: No rash.  Neurological:     Mental Status: He is alert.     Cranial Nerves: No cranial nerve deficit.     Sensory: No sensory deficit.     Gait: Gait normal.     Deep Tendon Reflexes: Reflexes are normal and symmetric.  Psychiatric:        Speech: Speech normal.        Behavior: Behavior normal.        Judgment: Judgment normal.       Results for orders placed or performed in visit on 03/12/23  CBC with Differential/Platelet  Result Value Ref Range   WBC 4.0 4.0 - 10.5 K/uL   RBC 4.78 4.22 - 5.81 Mil/uL   Hemoglobin 12.6 (L) 13.0 - 17.0 g/dL   HCT 16.1 09.6 - 04.5 %   MCV 82.6 78.0 - 100.0 fl   MCHC 31.9 30.0 - 36.0 g/dL   RDW 40.9 (H) 81.1 - 91.4 %   Platelets 271.0 150.0 - 400.0 K/uL   Neutrophils Relative % 31.1 (L) 43.0 - 77.0 %   Lymphocytes Relative 49.3 (H) 12.0 - 46.0 %   Monocytes Relative 10.5 3.0 - 12.0 %   Eosinophils Relative 7.8 (H) 0.0 - 5.0 %   Basophils Relative 1.3 0.0 - 3.0 %   Neutro Abs 1.2 (L) 1.4 - 7.7 K/uL   Lymphs Abs 2.0 0.7 - 4.0 K/uL   Monocytes Absolute 0.4 0.1 - 1.0 K/uL   Eosinophils Absolute 0.3 0.0 - 0.7 K/uL   Basophils Absolute 0.1 0.0 - 0.1 K/uL  Hemoglobin A1c  Result Value Ref Range   Hgb A1c MFr Bld 5.6 4.6 - 6.5 %  PSA  Result Value Ref Range   PSA 1.06 0.10 - 4.00 ng/mL  Lipid panel  Result Value Ref Range   Cholesterol 178 0 - 200 mg/dL    Triglycerides 78.2 0.0 - 149.0 mg/dL   HDL 95.62 >13.08 mg/dL   VLDL 65.7 0.0 - 84.6 mg/dL   LDL Cholesterol 962 (H) 0 - 99 mg/dL   Total CHOL/HDL Ratio 3    NonHDL 125.63   Comprehensive metabolic panel  Result Value Ref Range   Sodium 141 135 - 145 mEq/L   Potassium 3.8 3.5 - 5.1 mEq/L   Chloride 107 96 - 112 mEq/L   CO2 27 19 - 32 mEq/L   Glucose, Bld 95 70 - 99 mg/dL   BUN 6 6 - 23 mg/dL   Creatinine, Ser 9.52 0.40 - 1.50 mg/dL   Total Bilirubin 0.6 0.2 - 1.2 mg/dL   Alkaline Phosphatase 73 39 - 117 U/L   AST 26 0 - 37 U/L   ALT 28 0 - 53 U/L   Total Protein 6.9 6.0 - 8.3 g/dL   Albumin 4.3 3.5 - 5.2 g/dL   GFR 84.13 >24.40 mL/min   Calcium 9.5 8.4 - 10.5 mg/dL     COVID 19 screen:  No recent travel or known exposure to COVID19 The patient denies respiratory symptoms of COVID 19 at this time. The importance of social distancing was discussed today.   Assessment and Plan  The patient's preventative maintenance and recommended screening tests for an annual wellness exam were reviewed in full today.  Brought up to date unless services declined.  Counselled on the importance of diet, exercise, and its role in overall health and mortality. The patient's FH and SH was reviewed, including their home life, tobacco status, and drug and alcohol status.    Vaccines: Consider shingrix vaccine, and COVID vaccine. Tdap  and fluupto date Prostate Cancer Screen:  Some slight increase in PSA from last year. Lab Results  Component Value Date   PSA 1.06 03/12/2023   PSA 0.55 03/14/2022   PSA 0.76 03/27/2021  Colon Cancer Screen:  Dr. Norville Haggard 12/2020, repeat in 10 years      Smoking Status: Former, quit 11 years ago, yearly lung cancer screening ETOH/ drug use: none/none   Problem List Items Addressed This Visit     Abnormal LFTs     Resolved on recent evaluation.  No ETOH now.      Benign prostatic hyperplasia with urinary hesitancy    Stable, chronic.  Continue current  medication.  Flomax 0.4 mg daily.      Hyperlipidemia     Worsened control on LDL in last year. 10 year CVD risk 12.6% but 2022 calcium CT score 0. Not on statin medication.       Relevant Orders   Lipid panel   Hypertension    Stable, chronic.  Continue current medication.  amlodipine 5 mg daily, Toprol XL 50 mg daily      Other Visit Diagnoses     Medicare annual wellness visit, subsequent    -  Primary   Increased prostate specific antigen (PSA) velocity       Relevant Orders   PSA, total and free   Anemia, unspecified type       Relevant Orders   CBC with Differential/Platelet   IBC + Ferritin           Kerby Nora, MD

## 2023-03-19 NOTE — Assessment & Plan Note (Signed)
Worsened control on LDL in last year. 10 year CVD risk 12.6% but 2022 calcium CT score 0. Not on statin medication.

## 2023-03-19 NOTE — Patient Instructions (Signed)
OINCrease flomax to 0.8 mg daily.. take with meal.

## 2023-03-19 NOTE — Assessment & Plan Note (Signed)
Stable, chronic.  Continue current medication.  amlodipine 5 mg daily, Toprol XL 50 mg daily

## 2023-03-19 NOTE — Assessment & Plan Note (Signed)
Stable, chronic.  Continue current medication.  Flomax 0.4 mg daily.

## 2023-03-19 NOTE — Assessment & Plan Note (Addendum)
Resolved on recent evaluation.  No ETOH now.

## 2023-03-21 ENCOUNTER — Other Ambulatory Visit: Payer: Self-pay | Admitting: Family Medicine

## 2023-03-27 ENCOUNTER — Encounter: Payer: Self-pay | Admitting: Family Medicine

## 2023-04-08 ENCOUNTER — Other Ambulatory Visit: Payer: Self-pay | Admitting: Family Medicine

## 2023-04-08 DIAGNOSIS — N401 Enlarged prostate with lower urinary tract symptoms: Secondary | ICD-10-CM

## 2023-04-22 ENCOUNTER — Encounter: Payer: Self-pay | Admitting: Urology

## 2023-04-22 ENCOUNTER — Ambulatory Visit (INDEPENDENT_AMBULATORY_CARE_PROVIDER_SITE_OTHER): Payer: Medicare Other | Admitting: Urology

## 2023-04-22 VITALS — BP 133/81 | HR 76 | Ht 67.0 in | Wt 215.0 lb

## 2023-04-22 DIAGNOSIS — N138 Other obstructive and reflux uropathy: Secondary | ICD-10-CM

## 2023-04-22 DIAGNOSIS — Z125 Encounter for screening for malignant neoplasm of prostate: Secondary | ICD-10-CM

## 2023-04-22 DIAGNOSIS — N400 Enlarged prostate without lower urinary tract symptoms: Secondary | ICD-10-CM

## 2023-04-22 DIAGNOSIS — N401 Enlarged prostate with lower urinary tract symptoms: Secondary | ICD-10-CM | POA: Diagnosis not present

## 2023-04-22 LAB — BLADDER SCAN AMB NON-IMAGING

## 2023-04-22 NOTE — Progress Notes (Signed)
   04/22/23 1:20 PM   Alex Castaneda Oct 15, 1955 [id number]  CC: BPH/LUTS, PSA screening  HPI: 67 year old healthy male who reports about a year of worsening urinary symptoms, with primary symptoms of weak stream, dribbling, intermittency, and frequency.  He typically has nocturia 1-2 times overnight.  He tried Flomax with PCP but had bothersome stuffy nose, abdominal pain, and retrograde ejaculation.  Urinalysis with PCP in September 2024 was benign.  PVR today is normal at 7ml. PSA has been normal and stable, most recently 1.0, which has essentially been stable over the last 5 years.  He denies any dysuria or gross hematuria.  No prior imaging to review.    IPSS score today is 27 with QOL Unhappy.   PMH: Past Medical History:  Diagnosis Date   Arthritis    Colon polyps    GERD (gastroesophageal reflux disease)    Hyperlipidemia    Hypertension    Thyroid disease     Surgical History: Past Surgical History:  Procedure Laterality Date   COLONOSCOPY WITH PROPOFOL N/A 01/01/2021   Procedure: COLONOSCOPY WITH PROPOFOL;  Surgeon: Pasty Spillers, MD;  Location: [facility];  Service: Endoscopy;  Laterality: N/A;   ELBOW SURGERY Right    EYE SURGERY  2000   HERNIA REPAIR     Umbilical   KNEE SURGERY Left    arthroscopic   ROTATOR CUFF REPAIR Right     Family History: Family History  Problem Relation Age of Onset   Throat cancer Mother        smoker   COPD Sister    Cancer Brother        unknown   Arthritis Sister    Diabetes Sister    Diabetes Sister    Stroke Brother 61       tobacco use    Social History:  reports that he quit smoking about 33 years ago. His smoking use included cigarettes. He has been exposed to tobacco smoke. He has never used smokeless tobacco. He reports that he does not currently use alcohol. He reports that he does not use drugs.  Physical Exam: BP 133/81   Pulse 76   Ht 5\' 7"  (1.702 m)   Wt 215 lb (97.5 kg)   BMI 33.67 kg/m     Constitutional:  Alert and oriented, No acute distress. Cardiovascular: No clubbing, cyanosis, or edema. Respiratory: Normal respiratory effort, no increased work of breathing. GI: Abdomen is soft, nontender, nondistended, no abdominal masses   Laboratory Data: Reviewed, see HPI  Assessment & Plan:   67 year old healthy male with bothersome urinary symptoms of weak stream, frequency, intermittency, and postvoid dribbling.  Urinalysis, PSA, PVR normal, IPSS score 27.  Did not tolerate Flomax secondary to congestion and bothersome retrograde ejaculation.  He is interested in other alternative treatment options, especially UroLift.  We reviewed the importance of cystoscopy and prostate sizing with ultrasound prior to determining optimal treatment strategy, he also had questions about prostatic artery embolization.  We reviewed risks and benefits of UroLift versus PAE.  Follow-up for cystoscopy/TRUS and consideration of outlet procedures   Legrand Rams, MD 04/22/2023  [facility] [address] [place], [place] [address] [phone]

## 2023-04-22 NOTE — Patient Instructions (Signed)
Visit [email or web address].com for more information about urolift procedure for BPH.  Cystoscopy Cystoscopy is a procedure that is used to help diagnose and sometimes treat conditions that affect the lower urinary tract. The lower urinary tract includes the bladder and the urethra. The urethra is the tube that drains urine from the bladder. Cystoscopy is done using a thin, tube-shaped instrument with a light and camera at the end (cystoscope). The cystoscope may be hard or flexible, depending on the goal of the procedure. The cystoscope is inserted through the urethra, into the bladder. Cystoscopy may be recommended if you have: Urinary tract infections that keep coming back. Blood in the urine (hematuria). An inability to control when you urinate (urinary incontinence) or an overactive bladder. Unusual cells found in a urine sample. A blockage in the urethra, such as a urinary stone. Painful urination. An abnormality in the bladder found during an intravenous pyelogram (IVP) or CT scan. What are the risks? Generally, this is a safe procedure. However, problems may occur, including: Infection. Bleeding.  What happens during the procedure?  You will be given one or more of the following: A medicine to numb the area (local anesthetic). The area around the opening of your urethra will be cleaned. The cystoscope will be passed through your urethra into your bladder. Germ-free (sterile) fluid will flow through the cystoscope to fill your bladder. The fluid will stretch your bladder so that your health care provider can clearly examine your bladder walls. Your doctor will look at the urethra and bladder. The cystoscope will be removed The procedure may vary among health care providers  What can I expect after the procedure? After the procedure, it is common to have: Some soreness or pain in your urethra. Urinary symptoms. These include: Mild pain or burning when you urinate. Pain should stop within a few  minutes after you urinate. This may last for up to a few days after the procedure. A small amount of blood in your urine for several days. Feeling like you need to urinate but producing only a small amount of urine. Follow these instructions at home: General instructions Return to your normal activities as told by your health care provider.  Drink plenty of fluids after the procedure. Keep all follow-up visits as told by your health care provider. This is important. Contact a health care provider if you: Have pain that gets worse or does not get better with medicine, especially pain when you urinate lasting longer than 72 hours after the procedure. Have trouble urinating. Get help right away if you: Have blood clots in your urine. Have a fever or chills. Are unable to urinate. Summary Cystoscopy is a procedure that is used to help diagnose and sometimes treat conditions that affect the lower urinary tract. Cystoscopy is done using a thin, tube-shaped instrument with a light and camera at the end. After the procedure, it is common to have some soreness or pain in your urethra. It is normal to have blood in your urine after the procedure.  If you were prescribed an antibiotic medicine, take it as told by your health care provider.  This information is not intended to replace advice given to you by your health care provider. Make sure you discuss any questions you have with your health care provider. Document Revised: 05/26/2018 Document Reviewed: 05/26/2018 Elsevier Patient Education  2020 [employer].

## 2023-04-30 ENCOUNTER — Ambulatory Visit: Payer: Federal, State, Local not specified - PPO | Admitting: Urology

## 2023-05-09 ENCOUNTER — Ambulatory Visit: Payer: Medicare Other | Admitting: Family Medicine

## 2023-05-12 ENCOUNTER — Encounter: Payer: Self-pay | Admitting: Family Medicine

## 2023-05-13 MED ORDER — GABAPENTIN 300 MG PO CAPS: 300.0000 mg | ORAL_CAPSULE | Freq: Three times a day (TID) | ORAL | 0 refills | Status: DC

## 2023-05-21 ENCOUNTER — Encounter: Payer: Self-pay | Admitting: Family Medicine

## 2023-05-22 MED ORDER — LOSARTAN POTASSIUM 25 MG PO TABS: 25.0000 mg | ORAL_TABLET | Freq: Every day | ORAL | 3 refills | Status: DC

## 2023-05-22 NOTE — Addendum Note (Signed)
Addended by: Damita Lack on: 05/22/2023 02:52 PM   Modules accepted: Orders

## 2023-05-23 ENCOUNTER — Encounter: Payer: Self-pay | Admitting: Family Medicine

## 2023-05-29 ENCOUNTER — Telehealth: Payer: Medicare Other | Admitting: Family Medicine

## 2023-06-05 ENCOUNTER — Other Ambulatory Visit: Payer: Medicare Other | Admitting: Urology

## 2023-06-16 ENCOUNTER — Other Ambulatory Visit (INDEPENDENT_AMBULATORY_CARE_PROVIDER_SITE_OTHER): Payer: Medicare Other

## 2023-06-16 DIAGNOSIS — D649 Anemia, unspecified: Secondary | ICD-10-CM

## 2023-06-16 DIAGNOSIS — E782 Mixed hyperlipidemia: Secondary | ICD-10-CM

## 2023-06-16 DIAGNOSIS — R972 Elevated prostate specific antigen [PSA]: Secondary | ICD-10-CM

## 2023-06-16 LAB — CBC WITH DIFFERENTIAL/PLATELET
Basophils Absolute: 0 10*3/uL (ref 0.0–0.1)
Basophils Relative: 0.4 % (ref 0.0–3.0)
Eosinophils Absolute: 0.1 10*3/uL (ref 0.0–0.7)
Eosinophils Relative: 2.9 % (ref 0.0–5.0)
HCT: 40.6 % (ref 39.0–52.0)
Hemoglobin: 13.1 g/dL (ref 13.0–17.0)
Lymphocytes Relative: 39.9 % (ref 12.0–46.0)
Lymphs Abs: 2.1 10*3/uL (ref 0.7–4.0)
MCHC: 32.3 g/dL (ref 30.0–36.0)
MCV: 79.3 fL (ref 78.0–100.0)
Monocytes Absolute: 0.5 10*3/uL (ref 0.1–1.0)
Monocytes Relative: 9 % (ref 3.0–12.0)
Neutro Abs: 2.5 10*3/uL (ref 1.4–7.7)
Neutrophils Relative %: 47.8 % (ref 43.0–77.0)
Platelets: 224 10*3/uL (ref 150.0–400.0)
RBC: 5.13 Mil/uL (ref 4.22–5.81)
RDW: 18.9 % — ABNORMAL HIGH (ref 11.5–15.5)
WBC: 5.2 10*3/uL (ref 4.0–10.5)

## 2023-06-16 LAB — LIPID PANEL
Cholesterol: 147 mg/dL (ref 0–200)
HDL: 67.9 mg/dL (ref >39.00)
LDL Cholesterol: 63 mg/dL (ref 0–99)
NonHDL: 78.73
Total CHOL/HDL Ratio: 2
Triglycerides: 79 mg/dL (ref 0.0–149.0)
VLDL: 15.8 mg/dL (ref 0.0–40.0)

## 2023-06-16 LAB — IBC + FERRITIN
Ferritin: 23.6 ng/mL (ref 22.0–322.0)
Iron: 41 ug/dL — ABNORMAL LOW (ref 42–165)
Saturation Ratios: 8.9 % — ABNORMAL LOW (ref 20.0–50.0)
TIBC: 460.6 ug/dL — ABNORMAL HIGH (ref 250.0–450.0)
Transferrin: 329 mg/dL (ref 212.0–360.0)

## 2023-06-19 ENCOUNTER — Encounter: Payer: Self-pay | Admitting: Family Medicine

## 2023-06-19 LAB — PSA, TOTAL AND FREE
PSA, % Free: 38 % (ref >25)
PSA, Free: 0.3 ng/mL
PSA, Total: 0.8 ng/mL (ref <=4.0)

## 2023-07-16 ENCOUNTER — Other Ambulatory Visit: Payer: Self-pay

## 2023-07-17 ENCOUNTER — Other Ambulatory Visit: Payer: Medicare Other | Admitting: Urology

## 2023-07-18 ENCOUNTER — Encounter: Payer: Self-pay | Admitting: Family Medicine

## 2023-07-22 ENCOUNTER — Telehealth: Payer: Self-pay | Admitting: Family Medicine

## 2023-07-22 NOTE — Telephone Encounter (Signed)
Pt came by office requesting to speak to Amy regarding coding/billing issues with his cpe back on 03/19/23 with Dr. Ermalene Searing. Pt states he was told to ask for Amy to help resolve the issue. Call back # [phone].

## 2023-07-23 ENCOUNTER — Telehealth: Payer: Self-pay | Admitting: Family Medicine

## 2023-07-23 NOTE — Telephone Encounter (Signed)
Copied from CRM [id number]. Topic: General - Other >> Jul 23, 2023 12:55 PM [place] A wrote: Reason for CRM: Patient returned call for Amy-please call when available

## 2023-07-25 ENCOUNTER — Encounter: Payer: Self-pay | Admitting: Family Medicine

## 2023-07-25 DIAGNOSIS — Z87891 Personal history of nicotine dependence: Secondary | ICD-10-CM

## 2023-07-29 ENCOUNTER — Encounter: Payer: Self-pay | Admitting: Podiatry

## 2023-07-29 ENCOUNTER — Ambulatory Visit (INDEPENDENT_AMBULATORY_CARE_PROVIDER_SITE_OTHER): Payer: Medicare Other | Admitting: Podiatry

## 2023-07-29 ENCOUNTER — Ambulatory Visit (INDEPENDENT_AMBULATORY_CARE_PROVIDER_SITE_OTHER): Payer: Medicare Other

## 2023-07-29 DIAGNOSIS — M2011 Hallux valgus (acquired), right foot: Secondary | ICD-10-CM | POA: Diagnosis not present

## 2023-07-29 NOTE — Telephone Encounter (Signed)
Please see telephone note from 2.4.25.

## 2023-07-29 NOTE — Progress Notes (Signed)
Chief Complaint  Patient presents with   Nail Problem    "This right big toenail feels weird sometime like something is under it.  It fell off." N  - toenail L - hallux right D - 1 year O - off and on C - feels like something is underneath it, it fell off and grew back, thick A - none T - none   Foot Pain    "My bunion bothers me at night." N - bunion L - right D - 1 year O - off and on C - achy pain A - while in bed T - Tylenol    HPI: 68 y.o. male presenting today for new complaint of associated tenderness at nighttime to his right great toe.  Patient states that at the end of the day when he goes to bed he notices some achiness specifically located to the right great toe.  No history of injury.  He has not done anything really for treatment  He also states that about 2 years ago he lost his toenail to the right great toe and it has grown back but occasionally he has mild sensation to the nail plate.  He would like to have it evaluated  Past Medical History:  Diagnosis Date   Arthritis    Colon polyps    GERD (gastroesophageal reflux disease)    Hyperlipidemia    Hypertension    Thyroid disease     Past Surgical History:  Procedure Laterality Date   COLONOSCOPY WITH PROPOFOL N/A 01/01/2021   Procedure: COLONOSCOPY WITH PROPOFOL;  Surgeon: Pasty Spillers, MD;  Location: [facility] END[facility]COPY;  Service: Endoscopy;  Laterality: N/A;   ELBOW SURGERY Right    EYE SURGERY  2000   HERNIA REPAIR     Umbilical   KNEE SURGERY Left    arthroscopic   ROTATOR CUFF REPAIR Right     Allergies  Allergen Reactions   Naproxen     Other reaction(s): Unknown to Patient//Family   Nsaids     Other reaction(s): Unknown to Patient//Family   Salicylates     Other reaction(s): Unknown to Patient//Family     Physical Exam: General: The patient is alert and oriented x3 in no acute distress.  Dermatology: Skin is warm, dry and supple bilateral lower extremities.  The nail plate  to the right hallux appears completely normal.  No concern clinically  Vascular: Palpable pedal pulses bilaterally. Capillary refill within normal limits.  No appreciable edema.  No erythema.  Neurological: Grossly intact via light touch  Musculoskeletal Exam: No pedal deformities noted.  There is no pain or tenderness with palpation to the MTP or IPJ of the great toe joint right foot.  No tenderness with palpation today.  Muscle strength 5/5 all compartments.  No tenderness along the flexor or extensor tendons as well  Radiographic Exam RT foot 07/29/2023:  Normal osseous mineralization. Joint spaces preserved.  No fractures or osseous irregularities noted.  Assessment/Plan of Care: 1.  Nocturnal right great toe pain 2.  Normal nail regrowth right hallux nail plate  -Patient states that he does have tenderness to the right great toe depending on his activity levels throughout the day.  During the day he wears socks or house slippers.  These are not very supportive. -Recommended the patient that he wears good supportive tennis shoes even around the house to see if this helps alleviate some of his symptoms -OTC Motrin as needed -Return to clinic as needed  Felecia Shelling, DPM [facility]  Dr. Felecia Shelling, DPM    [address] [place], [place] [address]                Office [phone]  Fax [phone]

## 2023-07-29 NOTE — Telephone Encounter (Signed)
Spoke to patient regarding request to change coding to a physical for wellness benefits on their spending card. This is based on patient's [employer] employee plan that he has based on retirement. Explained to patient that we could request to refile, but his medicare plan would not cover it because of age, after age 68 medicare only covers W2M first year and AWV sequentially after that.  Patient agreed to leave coding as is, especially since the plans had paid in full

## 2023-07-30 NOTE — Addendum Note (Signed)
Addended by: Kerby Nora E on: 07/30/2023 02:12 PM   Modules accepted: Orders

## 2023-08-07 ENCOUNTER — Ambulatory Visit: Payer: Medicare Other | Admitting: Urology

## 2023-08-07 VITALS — BP 143/91 | HR 77 | Ht 67.0 in | Wt 211.1 lb

## 2023-08-07 DIAGNOSIS — N138 Other obstructive and reflux uropathy: Secondary | ICD-10-CM | POA: Diagnosis not present

## 2023-08-07 DIAGNOSIS — N401 Enlarged prostate with lower urinary tract symptoms: Secondary | ICD-10-CM

## 2023-08-07 NOTE — Progress Notes (Signed)
Cystoscopy Procedure Note:  Indication: BPH/obstructive urinary symptoms  1 year worsening urinary symptoms including weak stream, dribbling, intermittency, frequency, nocturia 1-2 times overnight.  Had bothersome side effects from Flomax including stuffy nose, abdominal pain, retrograde ejaculation.  Urinalysis and PVR have been benign, PSA normal at 1.0 and stable over the last 5 years.  After informed consent and discussion of the procedure and its risks, Alex Castaneda was positioned and prepped in the standard fashion. Cystoscopy was performed with a flexible cystoscope. The urethra, bladder neck and entire bladder was visualized in a standard fashion. The prostate was moderate in size with obstructive lateral lobes, there was moderate elevation of the bladder neck. The ureteral orifices were visualized in their normal location and orientation.  Bladder mucosa grossly normal throughout, no abnormalities on retroflexion, no significant median lobe.  Imaging: The transrectal ultrasound probe was inserted into the rectum and measurements taken for calculated prostate volume of 27g  Findings: Prostate measures 27 g, obstructive appearing, elevated bladder neck  -----------------------------------------------------------------------------------------  Assessment and Plan: Prostate measures 27 g with obstructive appearing lateral lobe hypertrophy and an elevated bladder neck.  We discussed options including trial of a different alpha-blocker like alfuzosin, or surgical options that preserve ejaculatory function including transurethral incision of the prostate(TUIP) or UroLift.  We reviewed the risks and benefits of both procedures extensively, I think based on his elevated bladder neck he likely would benefit most from TUIP.  He would like to do further research prior to scheduling procedure, but okay to schedule if he desires.  Okay to schedule transurethral incision of the prostate or UroLift  if patient desires  Legrand Rams, MD 08/07/2023

## 2023-08-07 NOTE — Patient Instructions (Signed)
Transurethral incision of the prostate (TUIP) is a procedure to treat urinary symptoms caused by an enlarged prostate, a condition known as benign prostatic hyperplasia (BPH). TUIP is generally used in younger men with a small prostate who are concerned about fertility.  A combined visual and surgical instrument (resectoscope) is inserted through the tip of the penis into the tube that carries urine from your bladder (urethra). The prostate surrounds the urethra. The doctor cuts one or two small grooves in the area where the prostate and the bladder are connected (bladder neck) to widen the urinary channel and allow urine to pass through more easily.  TUIP is one of several minimally invasive treatment options for BPH. To determine the right BPH treatment choice for you, your doctor will consider how severe your symptoms are, what other health problems you have, and the size and shape of your prostate.   Why it's done TUIP helps reduce urinary signs and symptoms caused by BPH, including:  Frequent, urgent need to urinate Difficulty starting urination Slow (prolonged) urination Increased frequency of urination at night Stopping and starting again while urinating The feeling of not being able to completely empty the bladder Urinary tract infections TUIP might also be done to treat or prevent complications due to blocked urine flow, such as:  Recurring urinary tract infections Kidney or bladder damage Inability to control urination or an inability to urinate at all Bladder stones Blood in the urine TUIP can offer several advantages over other methods of treating BPH, such as transurethral resection of the prostate (TURP) and open prostatectomy. The advantages can include:  Lower risk of bleeding. TUIP can be a good option for men who take medication to thin their blood or who have a bleeding disorder that doesn't allow their blood to clot normally. Minimal hospital stay. TUIP can be done on  an outpatient basis, although some men need to stay overnight for observation. TUIP might be a safer option than surgery if you have certain other health problems. Lower risk of dry orgasm. TUIP is less likely than some other BPH treatments to cause the release of semen during ejaculation into the bladder rather than out of the penis (retrograde ejaculation). Retrograde ejaculation isn't harmful, but it can interfere with the ability to father a child. Request an appointment Risks   TUIP is generally safe with few if any major complications. Possible risks of TUIP can include:  Temporary difficulty urinating. You might have trouble urinating for a few days after the procedure. Until you can urinate on your own, you may need to have a tube (catheter) inserted into your penis to carry urine out of your bladder. Urinary tract infection. This type of infection is a possible complication after any prostate procedure. An infection is increasingly likely to occur the longer you have a catheter in place. Treatment typically includes antibiotics. Need for re-treatment. TUIP might be less effective on urinary symptoms than other minimally invasive treatments or surgery. You might need to be treated again with another BPH therapy. How you prepare Food and medications Several days before surgery, your doctor might recommend that you stop taking medications --such as warfarin (Jantoven) or clopidogrel (Plavix) -- that can increase your risk of bleeding. You also are likely to be prescribed an antibiotic to prevent a urinary tract infection.  Other precautions Arrange transportation because you won't be able to drive yourself home after the procedure that day or generally if you have a catheter in your bladder.  Recovery after  TUIP is generally quick, and most men have few symptoms after surgery. Ask your doctor how much recovery time you might need.  What you can expect You'll receive either a general  anesthetic, which will put you to sleep, or an anesthetic that blocks feeling from the waist down (spinal block).  During the procedure Your doctor might put numbing gel inside your urethra and might numb the prostate area with an injection given through your rectum.  Once the anesthetic is working, your doctor will insert a resectoscope into the tip of your penis and extend it to the prostate area. The resectoscope has a lens or camera, a cutting device, and valves to release fluid to wash the surgery area. Using the resectoscope, the doctor makes one or two cuts (incisions) on the inner surface of the prostate.  After the procedure You might have TUIP on an outpatient basis or you might need to stay overnight in the hospital for observation.  You will likely have a urinary catheter in place because of swelling that blocks urine flow. The catheter is generally removed after 1 to 3 days. You'll also likely take antibiotics to prevent a urinary tract infection.  You might notice:  Blood in the urine. This typically lasts a few days. Irritating urinary symptoms. You might feel an urgent or frequent need to urinate, or you might have to get up more often during the night to urinate. Most men experience burning, especially at the tip of the penis and near the end of urination. These symptoms generally last a few days. Difficulty holding urine.The loss of bladder control (incontinence) can occur because your bladder is used to having to push urine through a urethra narrowed by enlarged prostate tissue. For most men, this issue improves with time. Urinary tract infection. Urinary tract infections are a possible complication after any enlarged prostate procedure. The risk of infection increases the longer you have a catheter in place. Results It might take several weeks for you to experience noticeable improvement in urinary symptoms. If you notice any worsening urinary symptoms over time, see your  doctor. Some men need additional BPH treatment.  ----------------------------------------------------  Prostatic Urethral Lift (UroLift)  Prostatic urethral lift is a surgical procedure to treat symptoms of prostate gland enlargement that occurs with age (benign prostatic hypertrophy, BPH). The urethra passes between the two lobes of the prostate. The urethra is the part of the body that drains urine from the bladder. As the prostate enlarges, it can push on the urethra and cause problems with urinating. This procedure involves placing an implant that holds the prostate away from the urethra. The procedure is done using a thin device called a cystoscope. The device is inserted through the tip of the penis and moved up the urethra to the prostate. This is less invasive than other procedures that require an incision. You may have this procedure if: You have symptoms of BPH. Your prostate is not severely enlarged. Medicines to treat BPH are not working or not tolerated. You want to avoid possible sexual side effects from medicines or other procedures that are used to treat BPH. Tell a health care provider about: Any allergies you have. All medicines you are taking, including vitamins, herbs, eye drops, creams, and over-the-counter medicines. Any problems you or family members have had with anesthetic medicines. Any bleeding problems you have. Any surgeries you have had. Any medical conditions you have. What are the risks? Generally, this is a safe procedure. However, problems may occur,  including: Bleeding. Infection. Leaking of urine (incontinence). Allergic reactions to medicines. Return of BPH symptoms after 2 years, requiring more treatment. What happens before the procedure? When to stop eating and drinking Follow instructions from your health care provider about what you may eat and drink before your procedure. These may include: 8 hours before your procedure Stop eating most foods.  Do not eat meat, fried foods, or fatty foods. Eat only light foods, such as toast or crackers. All liquids are okay except energy drinks and alcohol. 6 hours before your procedure Stop eating. Drink only clear liquids, such as water, clear fruit juice, black coffee, plain tea, and sports drinks. Do not drink energy drinks or alcohol. 2 hours before your procedure Stop drinking all liquids. You may be allowed to take medicines with small sips of water. If you do not follow your health care provider's instructions, your procedure may be delayed or canceled. Medicines Ask your health care provider about: Changing or stopping your regular medicines. This is especially important if you are taking diabetes medicines or blood thinners. Taking medicines such as aspirin and ibuprofen. These medicines can thin your blood. Do not take these medicines unless your health care provider tells you to take them. Taking over-the-counter medicines, vitamins, herbs, and supplements. Surgery safety Ask your health care provider what steps will be taken to help prevent infection. These steps may include: Removing hair at the surgery site. Washing skin with a germ-killing soap. Taking antibiotic medicine. General instructions Do not use any products that contain nicotine or tobacco for at least 4 weeks before the procedure. These products include cigarettes, chewing tobacco, and vaping devices, such as e-cigarettes. If you need help quitting, ask your health care provider. If you will be going home right after the procedure, plan to have a responsible adult: Take you home from the hospital or clinic. You will not be allowed to drive. Care for you for the time you are told. What happens during the procedure? An IV may be inserted into one of your veins. You will be given one or more of the following: A medicine to help you relax (sedative). A medicine that is injected into your urethra to numb the area (local  anesthetic). A medicine to make you fall asleep (general anesthetic). A cystoscope will be inserted into your penis and moved through your urethra to your prostate. A device will be inserted through the cystoscope and used to press the lobes of your prostate away from your urethra. Implants will be inserted through the device to hold the lobes of your prostate in the widened position. The device and cystoscope will be removed. The procedure may vary among health care providers and hospitals. What happens after the procedure? Your blood pressure, heart rate, breathing rate, and blood oxygen level be monitored until you leave the hospital or clinic. If you were given a sedative during the procedure, it can affect you for several hours. Do not drive or operate machinery until your health care provider says that it is safe. Summary Prostatic urethral lift is a surgical procedure to relieve symptoms of prostate gland enlargement that occurs with age (benign prostatic hypertrophy, BPH). The procedure is performed with a thin device called a cystoscope. This device is inserted through the tip of the penis and moved up the urethra to reach the prostate. This is less invasive than other procedures that require an incision. If you will be going home right after the procedure, plan to have a responsible  adult take you home from the hospital or clinic. You will not be allowed to drive. This information is not intended to replace advice given to you by your health care provider. Make sure you discuss any questions you have with your health care provider. Document Revised: 12/29/2020 Document Reviewed: 12/29/2020 Elsevier Patient Education  2024 [employer].

## 2023-08-11 ENCOUNTER — Other Ambulatory Visit: Payer: Self-pay

## 2023-08-11 ENCOUNTER — Other Ambulatory Visit: Payer: Self-pay | Admitting: Urology

## 2023-08-11 DIAGNOSIS — N401 Enlarged prostate with lower urinary tract symptoms: Secondary | ICD-10-CM

## 2023-08-11 NOTE — Progress Notes (Signed)
 OR orders

## 2023-08-11 NOTE — Progress Notes (Unsigned)
 Surgical Physician Order Form [facility]  Dr. Legrand Rams, MD  * Scheduling expectation : Next Available  *Length of Case: 1 hour  *Clearance needed: no  *Anticoagulation Instructions: Hold all anticoagulants  *Aspirin Instructions: Hold Aspirin  *Post-op visit Date/Instructions:  1-3 day cath removal  *Diagnosis: BPH w/urinary obstruction  *Procedure: Transurethral incision of prostate(TUIP)    Additional orders: N/A  -Admit type: OUTpatient  -Anesthesia: General  -VTE Prophylaxis Standing Order SCD's       Other:   -Standing Lab Orders Per Anesthesia    Lab other: UA&Urine Culture  -Standing Test orders EKG/Chest x-ray per Anesthesia       Test other:   - Medications:  Ancef 2gm IV  -Other orders:  N/A

## 2023-08-12 ENCOUNTER — Telehealth: Payer: Self-pay

## 2023-08-12 ENCOUNTER — Other Ambulatory Visit: Payer: Medicare Other

## 2023-08-12 ENCOUNTER — Encounter: Payer: Self-pay | Admitting: Family Medicine

## 2023-08-12 DIAGNOSIS — N401 Enlarged prostate with lower urinary tract symptoms: Secondary | ICD-10-CM

## 2023-08-12 LAB — URINALYSIS, COMPLETE
Bilirubin, UA: NEGATIVE
Glucose, UA: NEGATIVE
Ketones, UA: NEGATIVE
Nitrite, UA: NEGATIVE
Protein,UA: NEGATIVE
RBC, UA: NEGATIVE
Specific Gravity, UA: 1.025 (ref 1.005–1.030)
Urobilinogen, Ur: 0.2 mg/dL (ref 0.2–1.0)
pH, UA: 5.5 (ref 5.0–7.5)

## 2023-08-12 LAB — MICROSCOPIC EXAMINATION

## 2023-08-12 NOTE — Telephone Encounter (Signed)
  Per Dr. Richardo Hanks, Patient is to be scheduled for Transurethral Incision of the Prostate   Mr. Alex Castaneda was contacted and possible surgical dates were discussed, Friday March 7th, 2025 was agreed upon for surgery.   Patient was instructed that Dr. Richardo Hanks will require them to provide a pre-op UA & CX prior to surgery. This was ordered and scheduled drop off appointment was made for 08/12/2023.    Patient was directed to call [phone] between 1-3pm the day before surgery to find out surgical arrival time.  Instructions were given not to eat or drink from midnight on the night before surgery and have a driver for the day of surgery. On the surgery day patient was instructed to enter through the Medical Mall entrance of [facility] report the Same Day Surgery desk.   Pre-Admit Testing will be in contact via phone to set up an interview with the anesthesia team to review your history and medications prior to surgery.   Reminder of this information was sent via MyChart to the patient.

## 2023-08-12 NOTE — Progress Notes (Signed)
   [facility] Surgical Posting Form  Surgery Date: Date: 08/22/2023  Surgeon: Dr. 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

## 2023-08-15 ENCOUNTER — Other Ambulatory Visit: Payer: Self-pay

## 2023-08-15 ENCOUNTER — Encounter
Admission: RE | Admit: 2023-08-15 | Discharge: 2023-08-15 | Disposition: A | Discharge status: Home / Self Care | Payer: Medicare Other | Source: Ambulatory Visit | Attending: Urology | Admitting: Urology

## 2023-08-15 VITALS — Ht 67.0 in | Wt 210.0 lb

## 2023-08-15 DIAGNOSIS — Z01812 Encounter for preprocedural laboratory examination: Secondary | ICD-10-CM

## 2023-08-15 DIAGNOSIS — R7989 Other specified abnormal findings of blood chemistry: Secondary | ICD-10-CM

## 2023-08-15 DIAGNOSIS — R001 Bradycardia, unspecified: Secondary | ICD-10-CM

## 2023-08-15 DIAGNOSIS — Z01810 Encounter for preprocedural cardiovascular examination: Secondary | ICD-10-CM

## 2023-08-15 NOTE — Patient Instructions (Addendum)
 Your procedure is scheduled on: Friday March 7  Report to the Registration Desk on the 1st floor of the [facility] To find out your arrival time, please call [phone] between 1PM - 3PM on:  Tuesday March 6  If your arrival time is 6:00 am, do not arrive before that time as the Medical Mall entrance doors do not open until 6:00 am.  REMEMBER: Instructions that are not followed completely may result in serious medical risk, up to and including death; or upon the discretion of your surgeon and anesthesiologist your surgery may need to be rescheduled.  Do not eat food after midnight the night before surgery.  No gum chewing or hard candies.   One week prior to surgery: Friday February 28  Stop Anti-inflammatories (NSAIDS) such as Advil, Aleve, Ibuprofen, Motrin, Naproxen, Naprosyn and Aspirin based products such as Excedrin, Goody's Powder, BC Powder. Stop ANY OVER THE COUNTER supplements until after surgery.  meloxicam [facility]) hold 7 days prior to surgery, last dose Thursday February 27  You may however, continue to take Tylenol if needed for pain up until the day of surgery.  Continue taking all of your other prescription medications up until the day of surgery.  ON THE DAY OF SURGERY ONLY TAKE THESE MEDICATIONS WITH SIPS OF WATER:  omeprazole (PRILOSEC) 20 MG capsule   No Alcohol for 24 hours before or after surgery.  No Smoking including e-cigarettes for 24 hours before surgery.  No chewable tobacco products for at least 6 hours before surgery.  No nicotine patches on the day of surgery.  Do not use any "recreational" drugs for at least a week (preferably 2 weeks) before your surgery.  Please be advised that the combination of cocaine and anesthesia may have negative outcomes, up to and including death. If you test positive for cocaine, your surgery will be cancelled.  On the morning of surgery brush your teeth with toothpaste and water, you may rinse your mouth with  mouthwash if you wish. Do not swallow any toothpaste or mouthwash.   Do not wear jewelry, make-up, hairpins, clips or nail polish.  For welded (permanent) jewelry: bracelets, anklets, waist bands, etc.  Please have this removed prior to surgery.  If it is not removed, there is a chance that hospital personnel will need to cut it off on the day of surgery.  Do not wear lotions, powders, or perfumes.   Do not shave body hair from the neck down 48 hours before surgery.  Do not bring valuables to the hospital. [facility] is not responsible for any missing/lost belongings or valuables.   Notify your doctor if there is any change in your medical condition (cold, fever, infection).  Wear comfortable clothing (specific to your surgery type) to the hospital.  After surgery, you can help prevent lung complications by doing breathing exercises.  Take deep breaths and cough every 1-2 hours.    If you are being discharged the day of surgery, you will not be allowed to drive home. You will need a responsible individual to drive you home and stay with you for 24 hours after surgery.   If you are taking public transportation, you will need to have a responsible individual with you.  Please call the Pre-admissions Testing Dept. at [phone] if you have any questions about these instructions.  Surgery Visitation Policy:  Patients having surgery or a procedure may have two visitors.  Children under the age of 36 must have an adult with  them who is not the patient.  Temporary Visitor Restrictions Due to increasing cases of flu, RSV and COVID-19: Children ages 22 and under will not be able to visit patients in [facility] hospitals under most circumstances.

## 2023-08-16 LAB — CULTURE, URINE COMPREHENSIVE

## 2023-08-18 ENCOUNTER — Encounter
Admission: RE | Admit: 2023-08-18 | Discharge: 2023-08-18 | Disposition: A | Discharge status: Home / Self Care | Payer: Medicare Other | Source: Ambulatory Visit | Attending: Urology | Admitting: Urology

## 2023-08-18 ENCOUNTER — Encounter: Payer: Self-pay | Admitting: Urgent Care

## 2023-08-18 DIAGNOSIS — Z01818 Encounter for other preprocedural examination: Secondary | ICD-10-CM | POA: Insufficient documentation

## 2023-08-18 DIAGNOSIS — R7989 Other specified abnormal findings of blood chemistry: Secondary | ICD-10-CM | POA: Insufficient documentation

## 2023-08-18 DIAGNOSIS — Z01810 Encounter for preprocedural cardiovascular examination: Secondary | ICD-10-CM

## 2023-08-18 DIAGNOSIS — Z01812 Encounter for preprocedural laboratory examination: Secondary | ICD-10-CM

## 2023-08-18 DIAGNOSIS — R001 Bradycardia, unspecified: Secondary | ICD-10-CM | POA: Insufficient documentation

## 2023-08-18 LAB — BASIC METABOLIC PANEL
Anion gap: 8 (ref 5–15)
BUN: 9 mg/dL (ref 8–23)
CO2: 26 mmol/L (ref 22–32)
Calcium: 9.6 mg/dL (ref 8.9–10.3)
Chloride: 107 mmol/L (ref 98–111)
Creatinine, Ser: 0.97 mg/dL (ref 0.61–1.24)
GFR, Estimated: >60 mL/min (ref >60)
Glucose, Bld: 91 mg/dL (ref 70–99)
Potassium: 3.5 mmol/L (ref 3.5–5.1)
Sodium: 141 mmol/L (ref 135–145)

## 2023-08-19 ENCOUNTER — Telehealth: Payer: Self-pay

## 2023-08-19 MED ORDER — AMOXICILLIN 500 MG PO TABS: 500.0000 mg | ORAL_TABLET | Freq: Two times a day (BID) | ORAL | 0 refills | Status: DC

## 2023-08-19 NOTE — Telephone Encounter (Signed)
 Spoke with pt. Pt. Advised to start medication today, sent to pharmacy per patient request.

## 2023-08-19 NOTE — Telephone Encounter (Signed)
-----   Message from Sondra Come sent at 08/19/2023  8:01 AM EST ----- Please start amoxicillin 500 mg twice daily x 7 days to sterilize urine prior to upcoming surgery  Legrand Rams, MD 08/19/2023

## 2023-08-21 ENCOUNTER — Ambulatory Visit (INDEPENDENT_AMBULATORY_CARE_PROVIDER_SITE_OTHER): Payer: Medicare Other

## 2023-08-21 DIAGNOSIS — Z87891 Personal history of nicotine dependence: Secondary | ICD-10-CM | POA: Diagnosis not present

## 2023-08-22 ENCOUNTER — Ambulatory Visit: Payer: Self-pay | Admitting: Urgent Care

## 2023-08-22 ENCOUNTER — Encounter: Payer: Self-pay | Admitting: Family Medicine

## 2023-08-22 ENCOUNTER — Ambulatory Visit
Admission: RE | Admit: 2023-08-22 | Discharge: 2023-08-22 | Disposition: A | Discharge status: Home / Self Care | Payer: Medicare Other | Attending: Urology | Admitting: Urology

## 2023-08-22 ENCOUNTER — Encounter: Payer: Self-pay | Admitting: Urology

## 2023-08-22 ENCOUNTER — Other Ambulatory Visit: Payer: Self-pay

## 2023-08-22 ENCOUNTER — Encounter
Admission: RE | Disposition: A | Discharge status: Home / Self Care | Payer: Self-pay | Source: Home / Self Care | Attending: Urology

## 2023-08-22 DIAGNOSIS — I10 Essential (primary) hypertension: Secondary | ICD-10-CM | POA: Insufficient documentation

## 2023-08-22 DIAGNOSIS — N5314 Retrograde ejaculation: Secondary | ICD-10-CM | POA: Diagnosis not present

## 2023-08-22 DIAGNOSIS — K219 Gastro-esophageal reflux disease without esophagitis: Secondary | ICD-10-CM | POA: Insufficient documentation

## 2023-08-22 DIAGNOSIS — G4730 Sleep apnea, unspecified: Secondary | ICD-10-CM | POA: Diagnosis not present

## 2023-08-22 DIAGNOSIS — Z6832 Body mass index (BMI) 32.0-32.9, adult: Secondary | ICD-10-CM | POA: Diagnosis not present

## 2023-08-22 DIAGNOSIS — M1990 Unspecified osteoarthritis, unspecified site: Secondary | ICD-10-CM | POA: Diagnosis not present

## 2023-08-22 DIAGNOSIS — E669 Obesity, unspecified: Secondary | ICD-10-CM | POA: Insufficient documentation

## 2023-08-22 DIAGNOSIS — N401 Enlarged prostate with lower urinary tract symptoms: Secondary | ICD-10-CM | POA: Insufficient documentation

## 2023-08-22 DIAGNOSIS — N138 Other obstructive and reflux uropathy: Secondary | ICD-10-CM | POA: Insufficient documentation

## 2023-08-22 DIAGNOSIS — Z8261 Family history of arthritis: Secondary | ICD-10-CM | POA: Insufficient documentation

## 2023-08-22 DIAGNOSIS — Z87891 Personal history of nicotine dependence: Secondary | ICD-10-CM | POA: Insufficient documentation

## 2023-08-22 SURGERY — INCISION, PROSTATE, TRANSURETHRAL
Anesthesia: General | Site: Prostate

## 2023-08-22 MED ORDER — ONDANSETRON HCL 4 MG/2ML IJ SOLN: 4.0000 mg | Freq: Once | INTRAMUSCULAR | Status: DC | PRN

## 2023-08-22 MED ORDER — TRAMADOL HCL 50 MG PO TABS: 25.0000 mg | ORAL_TABLET | Freq: Four times a day (QID) | ORAL | 0 refills | Status: AC | PRN

## 2023-08-22 MED ORDER — ROCURONIUM BROMIDE 100 MG/10ML IV SOLN
INTRAVENOUS | Status: DC | PRN
  Administered 2023-08-22: 40 mg via INTRAVENOUS

## 2023-08-22 MED ORDER — LACTATED RINGERS IV SOLN: INTRAVENOUS | Status: DC

## 2023-08-22 MED ORDER — FENTANYL CITRATE (PF) 100 MCG/2ML IJ SOLN
INTRAMUSCULAR | Status: DC | PRN
  Administered 2023-08-22 (×2): 50 ug via INTRAVENOUS

## 2023-08-22 MED ORDER — LIDOCAINE HCL (CARDIAC) PF 100 MG/5ML IV SOSY
PREFILLED_SYRINGE | INTRAVENOUS | Status: DC | PRN
  Administered 2023-08-22: 100 mg via INTRAVENOUS

## 2023-08-22 MED ORDER — PROPOFOL 10 MG/ML IV BOLUS
INTRAVENOUS | Status: DC | PRN
Start: 2023-08-22 — End: 2023-08-22
  Administered 2023-08-22: 150 mg via INTRAVENOUS

## 2023-08-22 MED ORDER — SUGAMMADEX SODIUM 200 MG/2ML IV SOLN
INTRAVENOUS | Status: DC | PRN
  Administered 2023-08-22: 381.2 mg via INTRAVENOUS

## 2023-08-22 MED ORDER — DEXAMETHASONE SODIUM PHOSPHATE 10 MG/ML IJ SOLN
INTRAMUSCULAR | Status: AC
  Filled 2023-08-22: qty 1

## 2023-08-22 MED ORDER — OXYCODONE HCL 5 MG/5ML PO SOLN: 5.0000 mg | Freq: Once | ORAL | Status: AC | PRN

## 2023-08-22 MED ORDER — PROPOFOL 10 MG/ML IV BOLUS
INTRAVENOUS | Status: AC
  Filled 2023-08-22: qty 20

## 2023-08-22 MED ORDER — MIDAZOLAM HCL 2 MG/2ML IJ SOLN
INTRAMUSCULAR | Status: AC
  Filled 2023-08-22: qty 2

## 2023-08-22 MED ORDER — KETOROLAC TROMETHAMINE 30 MG/ML IJ SOLN
INTRAMUSCULAR | Status: AC
  Filled 2023-08-22: qty 1

## 2023-08-22 MED ORDER — ONDANSETRON HCL 4 MG/2ML IJ SOLN
INTRAMUSCULAR | Status: DC | PRN
  Administered 2023-08-22: 4 mg via INTRAVENOUS

## 2023-08-22 MED ORDER — OXYCODONE HCL 5 MG PO TABS
ORAL_TABLET | ORAL | Status: AC
  Filled 2023-08-22: qty 1

## 2023-08-22 MED ORDER — CIPROFLOXACIN IN D5W 400 MG/200ML IV SOLN
400.0000 mg | Freq: Once | INTRAVENOUS | Status: AC
  Administered 2023-08-22: 400 mg via INTRAVENOUS

## 2023-08-22 MED ORDER — CIPROFLOXACIN IN D5W 400 MG/200ML IV SOLN
INTRAVENOUS | Status: AC
  Filled 2023-08-22: qty 200

## 2023-08-22 MED ORDER — MIDAZOLAM HCL 2 MG/2ML IJ SOLN
INTRAMUSCULAR | Status: DC | PRN
Start: 2023-08-22 — End: 2023-08-22
  Administered 2023-08-22: 2 mg via INTRAVENOUS

## 2023-08-22 MED ORDER — OXYCODONE HCL 5 MG PO TABS
5.0000 mg | ORAL_TABLET | Freq: Once | ORAL | Status: AC | PRN
  Administered 2023-08-22: 5 mg via ORAL

## 2023-08-22 MED ORDER — CHLORHEXIDINE GLUCONATE 0.12 % MT SOLN
15.0000 mL | Freq: Once | OROMUCOSAL | Status: AC
  Administered 2023-08-22: 15 mL via OROMUCOSAL

## 2023-08-22 MED ORDER — ROCURONIUM BROMIDE 10 MG/ML (PF) SYRINGE
PREFILLED_SYRINGE | INTRAVENOUS | Status: AC
  Filled 2023-08-22: qty 10

## 2023-08-22 MED ORDER — CEFAZOLIN SODIUM-DEXTROSE 2-4 GM/100ML-% IV SOLN
INTRAVENOUS | Status: AC
  Filled 2023-08-22: qty 100

## 2023-08-22 MED ORDER — ACETAMINOPHEN 10 MG/ML IV SOLN: 1000.0000 mg | Freq: Once | INTRAVENOUS | Status: DC | PRN

## 2023-08-22 MED ORDER — CEFAZOLIN SODIUM-DEXTROSE 2-4 GM/100ML-% IV SOLN
2.0000 g | INTRAVENOUS | Status: DC
  Administered 2023-08-22: 2 g via INTRAVENOUS

## 2023-08-22 MED ORDER — SODIUM CHLORIDE 0.9 % IR SOLN
Status: DC | PRN
  Administered 2023-08-22: 6000 mL

## 2023-08-22 MED ORDER — SUGAMMADEX SODIUM 200 MG/2ML IV SOLN
INTRAVENOUS | Status: AC
  Filled 2023-08-22: qty 4

## 2023-08-22 MED ORDER — DEXAMETHASONE SODIUM PHOSPHATE 10 MG/ML IJ SOLN
INTRAMUSCULAR | Status: DC | PRN
  Administered 2023-08-22: 10 mg via INTRAVENOUS

## 2023-08-22 MED ORDER — ORAL CARE MOUTH RINSE: 15.0000 mL | Freq: Once | OROMUCOSAL | Status: AC

## 2023-08-22 MED ORDER — CHLORHEXIDINE GLUCONATE 0.12 % MT SOLN
OROMUCOSAL | Status: AC
  Filled 2023-08-22: qty 15

## 2023-08-22 MED ORDER — FENTANYL CITRATE (PF) 100 MCG/2ML IJ SOLN
INTRAMUSCULAR | Status: AC
  Filled 2023-08-22: qty 2

## 2023-08-22 MED ORDER — ONDANSETRON HCL 4 MG/2ML IJ SOLN
INTRAMUSCULAR | Status: AC
  Filled 2023-08-22: qty 2

## 2023-08-22 MED ORDER — FENTANYL CITRATE (PF) 100 MCG/2ML IJ SOLN: 25.0000 ug | INTRAMUSCULAR | Status: DC | PRN

## 2023-08-22 SURGICAL SUPPLY — 26 items
ADAPTER IRRIG TUBE 2 SPIKE SOL (ADAPTER) ×2 IMPLANT
BAG DRAIN SIEMENS DORNER NS (MISCELLANEOUS) ×1 IMPLANT
BAG URO DRAIN 4000ML (MISCELLANEOUS) ×1 IMPLANT
CATH FOL 2WAY LX 22X30 (CATHETERS) IMPLANT
CATH FOLEY 2WAY 18X30 (CATHETERS) ×1 IMPLANT
CATH FOLEY 2WAY SIL 18X30 (CATHETERS) IMPLANT
CATH FOLEY 3WAY 30CC 22FR (CATHETERS) IMPLANT
DRAPE UTILITY 15X26 TOWEL STRL (DRAPES) ×1 IMPLANT
ELECT BIVAP BIPO 22/24 DONUT (ELECTROSURGICAL) IMPLANT
ELECT HOOK LOOP BIPOLAR (NEEDLE) IMPLANT
ELECT LOOP 22F BIPOLAR SML (ELECTROSURGICAL) IMPLANT
ELECTRD BIVAP BIPO 22/24 DONUT (ELECTROSURGICAL) IMPLANT
ELECTRODE LOOP 22F BIPOLAR SML (ELECTROSURGICAL) IMPLANT
GLOVE BIOGEL PI IND STRL 7.5 (GLOVE) ×1 IMPLANT
GOWN STRL REUS W/ TWL LRG LVL3 (GOWN DISPOSABLE) ×1 IMPLANT
GOWN STRL REUS W/ TWL XL LVL3 (GOWN DISPOSABLE) ×1 IMPLANT
HOLDER FOLEY CATH W/STRAP (MISCELLANEOUS) ×1 IMPLANT
IV NS IRRIG 3000ML ARTHROMATIC (IV SOLUTION) ×6 IMPLANT
KIT TURNOVER CYSTO (KITS) ×1 IMPLANT
LOOP CUT BIPOLAR 24F LRG (ELECTROSURGICAL) IMPLANT
PACK CYSTO AR (MISCELLANEOUS) ×1 IMPLANT
SET IRRIG Y TYPE TUR BLADDER L (SET/KITS/TRAYS/PACK) ×1 IMPLANT
SYR TOOMEY IRRIG 70ML (MISCELLANEOUS) ×1 IMPLANT
SYRINGE TOOMEY IRRIG 70ML (MISCELLANEOUS) ×1 IMPLANT
WATER STERILE IRR 1000ML POUR (IV SOLUTION) ×1 IMPLANT
WATER STERILE IRR 500ML POUR (IV SOLUTION) ×1 IMPLANT

## 2023-08-22 NOTE — Op Note (Signed)
 Date of procedure: 08/22/23  Preoperative diagnosis:  BPH with obstruction  Postoperative diagnosis:  Same  Procedure: Transurethral incision of the prostate  Surgeon: Legrand Rams, MD  Anesthesia: General  Complications: None  Intraoperative findings:  Small, tight prostate with significantly elevated bladder neck Normal-appearing bladder, mild trabeculations, ureteral orifices orthotopic bilaterally, no suspicious lesions Open prostate at conclusion, good hemostasis  EBL: Minimal  Specimens: None  Drains: 22 French two-way Foley, 30 mL in balloon  Indication: Alex Castaneda is a 68 y.o. patient with BPH and obstructive urinary symptoms, normal PSA, prostate measured 27 g with a high bladder neck, and he was interested in transurethral incision of prostate to preserve ejaculatory function and decrease risk of incontinence..  After reviewing the management options for treatment, they elected to proceed with the above surgical procedure(s). We have discussed the potential benefits and risks of the procedure, side effects of the proposed treatment, the likelihood of the patient achieving the goals of the procedure, and any potential problems that might occur during the procedure or recuperation. Informed consent has been obtained.  Description of procedure:  The patient was taken to the operating room and general anesthesia was induced. SCDs were placed for DVT prophylaxis. The patient was placed in the dorsal lithotomy position, prepped and draped in the usual sterile fashion, and preoperative antibiotics were administered. A preoperative time-out was performed.   A 26 French resectoscope was inserted into the urethra using the visual obturator, and this was followed proximally to the bladder.  The prostate was small to moderate in size with obstructing lateral lobes and a significantly elevated bladder neck.  Thorough cystoscopy showed no abnormal findings, ureteral orifices were  orthotopic bilaterally, there were mild bladder trabeculations.  The [employer] was used to make an incision with cautery from the right ureteral orifice down to the verumontanum.  This was deepened down to the level of the capsule of the prostate.  An identical procedure was performed on the left side from the left ureter down to the verumontanum.  Spot hemostasis was achieved with the [facility], and no significant bleeding was noted.  The bladder neck was open with a wide channel at the conclusion of procedure.  There was some median lobe tissue between the incisions, and this was not resected with his request to preserve ejaculatory function.  A 22 [place] two-way Foley passed easily into the bladder with return of pink urine, and 30 mL were placed in the balloon.  The catheter was irrigated with saline and remained light pink.  The catheter was connected to drainage.  Disposition: Stable to PACU  Plan: Foley removal on Monday in clinic RTC with MD 3 months PVR  Legrand Rams, MD

## 2023-08-22 NOTE — Transfer of Care (Signed)
 Immediate Anesthesia Transfer of Care Note  Patient: Alex Castaneda  Procedure(s) Performed: INCISION, PROSTATE, TRANSURETHRAL (Prostate)  Patient Location: PACU  Anesthesia Type:General  Level of Consciousness: drowsy and patient cooperative  Airway & Oxygen Therapy: Patient Spontanous Breathing and Patient connected to face mask oxygen  Post-op Assessment: Report given to RN and Post -op Vital signs reviewed and stable  Post vital signs: stable  Last Vitals:  Vitals Value Taken Time  BP 136/84 08/22/23 1003  Temp    Pulse 79 08/22/23 1007  Resp 19 08/22/23 1007  SpO2 100 % 08/22/23 1007  Vitals shown include unfiled device data.  Last Pain:  Vitals:   08/22/23 0719  TempSrc: Oral  PainSc: 0-No pain         Complications: No notable events documented.

## 2023-08-22 NOTE — Anesthesia Postprocedure Evaluation (Signed)
 Anesthesia Post Note  Patient: Alex Castaneda  Procedure(s) Performed: INCISION, PROSTATE, TRANSURETHRAL (Prostate)  Patient location during evaluation: PACU Anesthesia Type: General Level of consciousness: awake and alert, oriented and patient cooperative Pain management: pain level controlled Vital Signs Assessment: post-procedure vital signs reviewed and stable Respiratory status: spontaneous breathing, nonlabored ventilation and respiratory function stable Cardiovascular status: blood pressure returned to baseline and stable Postop Assessment: adequate PO intake Anesthetic complications: no   No notable events documented.   Last Vitals:  Vitals:   08/22/23 1015 08/22/23 1030  BP: 127/85 122/89  Pulse: 85 75  Resp: 17 18  Temp:    SpO2: 95% 96%    Last Pain:  Vitals:   08/22/23 1004  TempSrc:   PainSc: 0-No pain                 Reed Breech

## 2023-08-22 NOTE — Anesthesia Procedure Notes (Signed)
 Procedure Name: Intubation Date/Time: 08/22/2023 9:26 AM  Performed by: Emeterio Reeve, CRNAPre-anesthesia Checklist: Patient identified, Emergency Drugs available, Suction available and Patient being monitored Patient Re-evaluated:Patient Re-evaluated prior to induction Oxygen Delivery Method: Circle system utilized Preoxygenation: Pre-oxygenation with 100% oxygen Induction Type: IV induction Ventilation: Mask ventilation without difficulty Laryngoscope Size: McGrath and 4 Tube type: Oral Tube size: 7.5 mm Number of attempts: 1 Airway Equipment and Method: Stylet and Oral airway Placement Confirmation: ETT inserted through vocal cords under direct vision, positive ETCO2 and breath sounds checked- equal and bilateral Secured at: 23 cm Tube secured with: Tape Dental Injury: Teeth and Oropharynx as per pre-operative assessment  Comments: Cords clear; no trauma. CA

## 2023-08-22 NOTE — H&P (Signed)
   08/22/23 8:53 AM   Alex Castaneda 01/22/1956 [id number]  CC: BPH  HPI: 68 year old male with one year worsening urinary symptoms including weak stream, dribbling, intermittency, frequency, nocturia 1-2 times overnight. Had bothersome side effects from Flomax including stuffy nose, abdominal pain, retrograde ejaculation. Urinalysis and PVR have been benign, PSA normal at 1.0 and stable over the last 5 years.   Cystoscopy and TRUS showed a 27 g prostate with an elevated bladder neck and obstructive tissue.  Using shared decision making he opted for transurethral incision of the prostate.   PMH: Past Medical History:  Diagnosis Date   Abnormal LFTs 05/15/2022   Arthritis    Benign prostatic hyperplasia with urinary hesitancy 03/05/2023   Colon polyps    GERD (gastroesophageal reflux disease)    Hyperlipidemia    Hypertension    Osteoarthritis 07/13/2019   Spinal stenosis of lumbar region 07/20/2019   Thyroid disease    Trigeminal neuralgia 07/19/2019    Surgical History: Past Surgical History:  Procedure Laterality Date   COLONOSCOPY WITH PROPOFOL N/A 01/01/2021   Procedure: COLONOSCOPY WITH PROPOFOL;  Surgeon: Pasty Spillers, MD;  Location: [facility] ENDOSCOPY;  Service: Endoscopy;  Laterality: N/A;   ELBOW SURGERY Right    EYE SURGERY  2000   HERNIA REPAIR     Umbilical   KNEE SURGERY Left    arthroscopic   ROTATOR CUFF REPAIR Right      Family History: Family History  Problem Relation Age of Onset   Throat cancer Mother        smoker   COPD Sister    Cancer Brother        unknown   Arthritis Sister    Diabetes Sister    Diabetes Sister    Stroke Brother 54       tobacco use    Social History:  reports that he quit smoking about 34 years ago. His smoking use included cigarettes. He has been exposed to tobacco smoke. He has never used smokeless tobacco. He reports that he does not currently use alcohol. He reports that he does not use drugs.  Physical  Exam: BP (!) 140/92   Pulse 63   Temp 97.9 F (36.6 C) (Oral)   Resp 18   Ht 5\' 7"  (1.702 m)   Wt 95.3 kg   SpO2 98%   BMI 32.89 kg/m    Constitutional:  Alert and oriented, No acute distress. Cardiovascular: Regular rate and rhythm Respiratory: Clear to auscultation bilaterally GI: Abdomen is soft, nontender, nondistended, no abdominal masses   Laboratory Data: Urine culture 08/12/2023 with Enterococcus, treated with culture appropriate amoxicillin   Assessment & Plan:   68 year old male with obstructive urinary symptoms, bothersome side effects from Flomax, prostate measures 27 g with elevated bladder neck and obstructive lateral lobes.  He was interested in treatment to preserve retrograde ejaculation and minimize side effects.  We discussed options including UroLift, TURP, transurethral incision of the prostate, HOLEP, or PAE and he opted for incision of the prostate.  Risk and benefits discussed including bleeding, infection, recurrence, potential need for additional procedure in the future, low risk of incontinence, low risk of retrograde ejaculation.  Transurethral incision of prostate today   Legrand Rams, MD 08/22/2023  [facility] [address] [place], [place] [address] [phone]

## 2023-08-22 NOTE — Anesthesia Preprocedure Evaluation (Addendum)
 Anesthesia Evaluation  Patient identified by MRN, date of birth, ID band Patient awake    Reviewed: Allergy & Precautions, NPO status , Patient's Chart, lab work & pertinent test results  History of Anesthesia Complications Negative for: history of anesthetic complications  Airway Mallampati: III   Neck ROM: Full    Dental  (+) Missing   Pulmonary sleep apnea and Continuous Positive Airway Pressure Ventilation , former smoker (quit 1991)   Pulmonary exam normal breath sounds clear to auscultation       Cardiovascular hypertension, Normal cardiovascular exam Rhythm:Regular Rate:Normal  ECG 08/18/23:  Normal sinus rhythm Minimal voltage criteria for LVH, may be normal variant ( R in aVL )   Neuro/Psych negative neurological ROS     GI/Hepatic ,GERD  ,,  Endo/Other  Obesity   Renal/GU negative Renal ROS     Musculoskeletal  (+) Arthritis ,    Abdominal   Peds  Hematology negative hematology ROS (+)   Anesthesia Other Findings Cardiology note 08/07/22:  1. Sinus bradycardia, resolved with stopping Toprol-XL.   2. Hypertension, controlled.  Continue Norvasc, losartan 25 mg.  Low-salt diet, increase activity emphasized.   Follow-up as needed.   Reproductive/Obstetrics                             Anesthesia Physical Anesthesia Plan  ASA: 2  Anesthesia Plan: General   Post-op Pain Management:    Induction: Intravenous  PONV Risk Score and Plan: 2 and Ondansetron, Dexamethasone and Treatment may vary due to age or medical condition  Airway Management Planned: Oral ETT  Additional Equipment:   Intra-op Plan:   Post-operative Plan: Extubation in OR  Informed Consent: I have reviewed the patients History and Physical, chart, labs and discussed the procedure including the risks, benefits and alternatives for the proposed anesthesia with the patient or authorized representative who has  indicated his/her understanding and acceptance.     Dental advisory given  Plan Discussed with: CRNA  Anesthesia Plan Comments: (Patient consented for risks of anesthesia including but not limited to:  - adverse reactions to medications - damage to eyes, teeth, lips or other oral mucosa - nerve damage due to positioning  - sore throat or hoarseness - damage to heart, brain, nerves, lungs, other parts of body or loss of life  Informed patient about role of CRNA in peri- and intra-operative care.  Patient voiced understanding.)        Anesthesia Quick Evaluation

## 2023-08-25 ENCOUNTER — Encounter: Payer: Self-pay | Admitting: Urology

## 2023-08-25 ENCOUNTER — Ambulatory Visit (INDEPENDENT_AMBULATORY_CARE_PROVIDER_SITE_OTHER): Payer: Medicare Other | Admitting: Physician Assistant

## 2023-08-25 VITALS — BP 149/93 | HR 109 | Ht 67.0 in | Wt 210.0 lb

## 2023-08-25 DIAGNOSIS — N138 Other obstructive and reflux uropathy: Secondary | ICD-10-CM

## 2023-08-25 DIAGNOSIS — N401 Enlarged prostate with lower urinary tract symptoms: Secondary | ICD-10-CM

## 2023-08-25 NOTE — Progress Notes (Signed)
 Catheter Removal  Patient is present today for a catheter removal.  29ml of water was drained from the balloon. A 22FR foley cath was removed from the bladder, no complications were noted. Patient tolerated well.  Performed by: Carman Ching, PA-C  Additional notes: We discussed postop dysuria, hematuria, urgency/leakage.  Follow up/ Additional notes: Return in about 3 months (around 11/25/2023) for Postop IPSS/PVR with Dr. Richardo Hanks.

## 2023-08-30 ENCOUNTER — Encounter: Payer: Self-pay | Admitting: Family Medicine

## 2023-09-03 ENCOUNTER — Encounter: Payer: Self-pay | Admitting: Urology

## 2023-09-04 ENCOUNTER — Other Ambulatory Visit: Payer: Self-pay | Admitting: Family Medicine

## 2023-09-04 DIAGNOSIS — H9190 Unspecified hearing loss, unspecified ear: Secondary | ICD-10-CM

## 2023-09-24 ENCOUNTER — Encounter: Payer: Self-pay | Admitting: Family Medicine

## 2023-09-24 DIAGNOSIS — L739 Follicular disorder, unspecified: Secondary | ICD-10-CM

## 2023-09-25 MED ORDER — CLINDAMYCIN PHOS-BENZOYL PEROX 1-5 % EX GEL: CUTANEOUS | 0 refills | Status: DC

## 2023-09-25 NOTE — Addendum Note (Signed)
 Addended by: Kerby Nora E on: 09/25/2023 10:28 AM   Modules accepted: Orders

## 2023-09-29 ENCOUNTER — Encounter: Payer: Self-pay | Admitting: Family

## 2023-09-30 ENCOUNTER — Ambulatory Visit (INDEPENDENT_AMBULATORY_CARE_PROVIDER_SITE_OTHER): Admitting: Family

## 2023-09-30 ENCOUNTER — Encounter: Payer: Self-pay | Admitting: Family

## 2023-09-30 VITALS — BP 138/74 | HR 61 | Temp 98.0°F | Ht 67.0 in | Wt 211.6 lb

## 2023-09-30 DIAGNOSIS — R7989 Other specified abnormal findings of blood chemistry: Secondary | ICD-10-CM | POA: Diagnosis not present

## 2023-09-30 DIAGNOSIS — R110 Nausea: Secondary | ICD-10-CM

## 2023-09-30 DIAGNOSIS — J432 Centrilobular emphysema: Secondary | ICD-10-CM

## 2023-09-30 DIAGNOSIS — K2100 Gastro-esophageal reflux disease with esophagitis, without bleeding: Secondary | ICD-10-CM

## 2023-09-30 DIAGNOSIS — D649 Anemia, unspecified: Secondary | ICD-10-CM

## 2023-09-30 DIAGNOSIS — K5909 Other constipation: Secondary | ICD-10-CM | POA: Diagnosis not present

## 2023-09-30 DIAGNOSIS — R5383 Other fatigue: Secondary | ICD-10-CM | POA: Diagnosis not present

## 2023-09-30 DIAGNOSIS — E559 Vitamin D deficiency, unspecified: Secondary | ICD-10-CM

## 2023-09-30 DIAGNOSIS — Z862 Personal history of diseases of the blood and blood-forming organs and certain disorders involving the immune mechanism: Secondary | ICD-10-CM | POA: Insufficient documentation

## 2023-09-30 HISTORY — DX: Anemia, unspecified: D64.9

## 2023-09-30 HISTORY — DX: Centrilobular emphysema: J43.2

## 2023-09-30 LAB — CBC
HCT: 44.4 % (ref 39.0–52.0)
Hemoglobin: 14.4 g/dL (ref 13.0–17.0)
MCHC: 32.5 g/dL (ref 30.0–36.0)
MCV: 83.9 fl (ref 78.0–100.0)
Platelets: 290 10*3/uL (ref 150.0–400.0)
RBC: 5.3 Mil/uL (ref 4.22–5.81)
RDW: 16.4 % — ABNORMAL HIGH (ref 11.5–15.5)
WBC: 6 10*3/uL (ref 4.0–10.5)

## 2023-09-30 LAB — COMPREHENSIVE METABOLIC PANEL WITH GFR
ALT: 15 U/L (ref 0–53)
AST: 15 U/L (ref 0–37)
Albumin: 4.8 g/dL (ref 3.5–5.2)
Alkaline Phosphatase: 80 U/L (ref 39–117)
BUN: 10 mg/dL (ref 6–23)
CO2: 30 meq/L (ref 19–32)
Calcium: 9.5 mg/dL (ref 8.4–10.5)
Chloride: 104 meq/L (ref 96–112)
Creatinine, Ser: 1.16 mg/dL (ref 0.40–1.50)
GFR: 65.49 mL/min (ref >60.00)
Glucose, Bld: 93 mg/dL (ref 70–99)
Potassium: 4.1 meq/L (ref 3.5–5.1)
Sodium: 139 meq/L (ref 135–145)
Total Bilirubin: 0.5 mg/dL (ref 0.2–1.2)
Total Protein: 7.4 g/dL (ref 6.0–8.3)

## 2023-09-30 LAB — VITAMIN B12: Vitamin B-12: 251 pg/mL (ref 211–911)

## 2023-09-30 MED ORDER — PANTOPRAZOLE SODIUM 20 MG PO TBEC: 20.0000 mg | DELAYED_RELEASE_TABLET | Freq: Every day | ORAL | 0 refills | Status: DC

## 2023-09-30 NOTE — Patient Instructions (Signed)
 ------------------------------------   Add fiber supplement once daily.  Add a probiotic (such as Florastor) daily. Drink 64 oz of water a day. Eat lots of fresh fruit and veggies. Ensure regular exercise.    If you are not able to have regular BM's with the above regimen, you may add miralax 1 tablespoon daily.  Increase or decrease amount/frequency as needed to ensure 1 soft BM/day.   ------------------------------------  Stop omeprazole Start pantoprazole

## 2023-09-30 NOTE — Assessment & Plan Note (Signed)
 Continue zofran prn  Changing PPIs potential aggravation with silent gerd  Bland diet.  Not likely alpha gal, no beef or pork sensitivity Low suspicion h pylori but could consider in the future

## 2023-09-30 NOTE — Assessment & Plan Note (Signed)
Repeat cmp today pending results. 

## 2023-09-30 NOTE — Assessment & Plan Note (Signed)
 Repeat cbc ibc ferritin pending results

## 2023-09-30 NOTE — Assessment & Plan Note (Signed)
 Could contribute  Suggestion of miralax and also stool softener

## 2023-09-30 NOTE — Progress Notes (Signed)
 Established Patient Office Visit  Subjective:   Patient ID: Alex Castaneda, male    DOB: 06-10-1956  Age: 68 y.o. MRN: [id number]  CC:  Chief Complaint  Patient presents with   Loss of smell    Patient has been experiencing loss of smell, no appetite and nausea for about 6 days. Tested negative for Covid. He tested 2 days ago    HPI: Alex Castaneda is a 68 y.o. male presenting on 09/30/2023 for Loss of smell (Patient has been experiencing loss of smell, no appetite and nausea for about 6 days. Tested negative for Covid. He tested 2 days ago)  Ongoing nausea, started one week ago today. He states initially he had also felt weak. He states has been 'endless and still hasn't stopped'. Also tested two days ago for covid and was negative.   No diarrhea and or vomiting, just nausea.  Has slight abdominal pressure pain, cramping pains early at symptom start but not recently, he states it came on when he felt he needed to poop. Stool was hard last night, doesn't typically get constipation. Felt like he had sufficient amount of movement however.  No urinary symptoms. No fever.  Does not take regular NSAIDS on a daily basis.  He does report feeling woozy with some bowel movements recently. Does not note increased flatulence   He did have a UTI back in February was treated with amoxicillin.  Did not have symptoms at this time either. No heartburn.   He would like his iron tested as he was taking daily iron up until about 5 weeks ago.   Recent procedure 2/25 for prostate transurethral with Dr. Richardo Hanks.  He is doing well with sleeping.  Urine was negative yesterday in urgent care.  EKG was negative as well, unsure why was ordered.   Wt Readings from Last 3 Encounters:  09/30/23 211 lb 9.6 oz (96 kg)  08/25/23 210 lb (95.3 kg)  08/22/23 210 lb (95.3 kg)             ROS: Negative unless specifically indicated above in HPI.   Relevant past medical history reviewed and updated as  indicated.   Allergies and medications reviewed and updated.   Current Outpatient Medications:    acetaminophen (TYLENOL) 500 MG tablet, Take 500-1,000 mg by mouth every 6 (six) hours as needed (pain.)., Disp: , Rfl:    amLODipine (NORVASC) 5 MG tablet, TAKE 1 TABLET(5 MG) BY MOUTH DAILY, Disp: 90 tablet, Rfl: 3   carboxymethylcellulose (REFRESH PLUS) 0.5 % SOLN, Place 1 drop into both eyes 3 (three) times daily as needed (dry/irritated eyes.)., Disp: , Rfl:    clindamycin-benzoyl peroxide (BENZACLIN) gel, APPLY TOPICALLY TWICE DAILY TO AFFECTED AREA ON FACE, TRUNK, Disp: 50 g, Rfl: 0   gabapentin (NEURONTIN) 300 MG capsule, Take 1 capsule (300 mg total) by mouth 3 (three) times daily. (Patient taking differently: Take 300 mg by mouth 3 (three) times daily as needed (pain.).), Disp: 90 capsule, Rfl: 0   losartan (COZAAR) 25 MG tablet, Take 1 tablet (25 mg total) by mouth daily., Disp: 90 tablet, Rfl: 3   meloxicam (MOBIC) 15 MG tablet, Take 15 mg by mouth at bedtime as needed for pain., Disp: , Rfl:    ondansetron (ZOFRAN-ODT) 4 MG disintegrating tablet, Take 4 mg by mouth 2 (two) times daily.  Take 2 tablets (8 mg total) by mouth every 8 (eight) hours if needed for nausea or vomiting for up to 5 days., Disp: , Rfl:  pantoprazole (PROTONIX) 20 MG tablet, Take 1 tablet (20 mg total) by mouth daily., Disp: 90 tablet, Rfl: 0  Allergies  Allergen Reactions   Naproxen Other (See Comments)    Lightheadedness (patient states he can now tolerate)   Nsaids Other (See Comments)    Lightheadedness (patient states he can now tolerate)    Salicylates     Other reaction(s): Unknown to Patient//Family    Objective:   BP 138/74   Pulse 61   Temp 98 F (36.7 C) (Oral)   Ht 5\' 7"  (1.702 m)   Wt 211 lb 9.6 oz (96 kg)   SpO2 97%   BMI 33.14 kg/m    Physical Exam Vitals reviewed.  Constitutional:      General: He is not in acute distress.    Appearance: Normal appearance. He is obese. He is  not ill-appearing, toxic-appearing or diaphoretic.  HENT:     Head: Normocephalic.     Right Ear: Tympanic membrane normal.     Left Ear: Tympanic membrane normal.     Nose: Nose normal.     Right Turbinates: Swollen.     Mouth/Throat:     Mouth: Mucous membranes are moist.  Eyes:     Pupils: Pupils are equal, round, and reactive to light.  Cardiovascular:     Rate and Rhythm: Normal rate and regular rhythm.  Pulmonary:     Effort: Pulmonary effort is normal.     Breath sounds: Normal breath sounds. No wheezing.  Abdominal:     General: Abdomen is flat. Bowel sounds are normal.     Palpations: Abdomen is soft.     Tenderness: There is no abdominal tenderness.     Hernia: No hernia is present.  Musculoskeletal:        General: Normal range of motion.     Cervical back: Normal range of motion.  Neurological:     General: No focal deficit present.     Mental Status: He is alert and oriented to person, place, and time. Mental status is at baseline.  Psychiatric:        Mood and Affect: Mood normal.        Behavior: Behavior normal.        Thought Content: Thought content normal.        Judgment: Judgment normal.     Assessment & Plan:  Other constipation Assessment & Plan: Could contribute  Suggestion of miralax and also stool softener    Other fatigue -     Vitamin B12  Abnormal LFTs Assessment & Plan: Repeat cmp today pending results  Orders: -     Comprehensive metabolic panel with GFR  Vitamin D deficiency  Anemia, unspecified type Assessment & Plan: Repeat cbc ibc ferritin pending results.    Orders: -     CBC -     IBC + Ferritin; Future  Gastroesophageal reflux disease with esophagitis without hemorrhage Assessment & Plan: Stop omeprazole start pantoprazole  Nausea could potentially be aggravated by gerd symptoms.    Orders: -     Pantoprazole Sodium; Take 1 tablet (20 mg total) by mouth daily.  Dispense: 90 tablet; Refill: 0  Centrilobular  emphysema (HCC)  Nausea Assessment & Plan: Continue zofran prn  Changing PPIs potential aggravation with silent gerd  Bland diet.  Not likely alpha gal, no beef or pork sensitivity Low suspicion h pylori but could consider in the future       Follow up plan: Return for f/u  PCP if no improvement in symptoms.  Felicita Horns, FNP

## 2023-09-30 NOTE — Assessment & Plan Note (Signed)
 Stop omeprazole start pantoprazole  Nausea could potentially be aggravated by gerd symptoms.

## 2023-10-01 ENCOUNTER — Encounter: Payer: Self-pay | Admitting: Family

## 2023-10-01 NOTE — Telephone Encounter (Signed)
 Noted.

## 2023-10-03 ENCOUNTER — Other Ambulatory Visit: Payer: Self-pay

## 2023-10-03 ENCOUNTER — Emergency Department

## 2023-10-03 ENCOUNTER — Emergency Department
Admission: EM | Admit: 2023-10-03 | Discharge: 2023-10-03 | Disposition: A | Discharge status: Home / Self Care | Attending: Emergency Medicine | Admitting: Emergency Medicine

## 2023-10-03 DIAGNOSIS — R55 Syncope and collapse: Secondary | ICD-10-CM | POA: Insufficient documentation

## 2023-10-03 DIAGNOSIS — R110 Nausea: Secondary | ICD-10-CM | POA: Insufficient documentation

## 2023-10-03 DIAGNOSIS — J439 Emphysema, unspecified: Secondary | ICD-10-CM | POA: Diagnosis not present

## 2023-10-03 DIAGNOSIS — R531 Weakness: Secondary | ICD-10-CM | POA: Insufficient documentation

## 2023-10-03 LAB — COMPREHENSIVE METABOLIC PANEL WITH GFR
ALT: 21 U/L (ref 0–44)
AST: 17 U/L (ref 15–41)
Albumin: 4.5 g/dL (ref 3.5–5.0)
Alkaline Phosphatase: 68 U/L (ref 38–126)
Anion gap: 7 (ref 5–15)
BUN: 11 mg/dL (ref 8–23)
CO2: 23 mmol/L (ref 22–32)
Calcium: 9.5 mg/dL (ref 8.9–10.3)
Chloride: 106 mmol/L (ref 98–111)
Creatinine, Ser: 1.13 mg/dL (ref 0.61–1.24)
GFR, Estimated: >60 mL/min (ref >60)
Glucose, Bld: 89 mg/dL (ref 70–99)
Potassium: 3.7 mmol/L (ref 3.5–5.1)
Sodium: 136 mmol/L (ref 135–145)
Total Bilirubin: 0.9 mg/dL (ref 0.0–1.2)
Total Protein: 7.8 g/dL (ref 6.5–8.1)

## 2023-10-03 LAB — URINALYSIS, ROUTINE W REFLEX MICROSCOPIC
Bilirubin Urine: NEGATIVE
Glucose, UA: NEGATIVE mg/dL
Hgb urine dipstick: NEGATIVE
Ketones, ur: NEGATIVE mg/dL
Leukocytes,Ua: NEGATIVE
Nitrite: NEGATIVE
Protein, ur: NEGATIVE mg/dL
Specific Gravity, Urine: 1.012 (ref 1.005–1.030)
pH: 5 (ref 5.0–8.0)

## 2023-10-03 LAB — CBC
HCT: 42.2 % (ref 39.0–52.0)
Hemoglobin: 14.4 g/dL (ref 13.0–17.0)
MCH: 27.4 pg (ref 26.0–34.0)
MCHC: 34.1 g/dL (ref 30.0–36.0)
MCV: 80.2 fL (ref 80.0–100.0)
Platelets: 292 10*3/uL (ref 150–400)
RBC: 5.26 MIL/uL (ref 4.22–5.81)
RDW: 14.8 % (ref 11.5–15.5)
WBC: 7.1 10*3/uL (ref 4.0–10.5)
nRBC: 0 % (ref 0.0–0.2)

## 2023-10-03 LAB — LIPASE, BLOOD: Lipase: 42 U/L (ref 11–51)

## 2023-10-03 MED ORDER — ONDANSETRON HCL 4 MG/2ML IJ SOLN
4.0000 mg | Freq: Once | INTRAMUSCULAR | Status: AC
  Administered 2023-10-03: 4 mg via INTRAVENOUS
  Filled 2023-10-03: qty 2

## 2023-10-03 MED ORDER — SODIUM CHLORIDE 0.9 % IV SOLN: Freq: Once | INTRAVENOUS | Status: AC

## 2023-10-03 NOTE — ED Notes (Signed)
 MD at bedside.

## 2023-10-03 NOTE — ED Triage Notes (Signed)
 Pt comes with nausea, fatigue and weakness. Pt states this started about 10 days ago. Pt denies any pain. Pt denies any cough or cold symptoms.

## 2023-10-03 NOTE — ED Provider Notes (Signed)
 [facility] Provider Note    Event Date/Time   First MD Initiated Contact with Patient 10/03/23 1644     (approximate)   History   Nausea   HPI  Alex Castaneda is a 68 y.o. male with a history of emphysema, BPH status post TURP 5 weeks ago who presents with complaints of nausea for about 7 to 10 days.  Patient reports this developed abruptly after taking his wife to an appointment.  He has been nearly continuously nauseated over the last week to 10 days.  Symptoms worsens with sudden movements.  No significant abdominal pain.  No chest pain.  No neurodeficits.  No history of vertigo, no ringing in the ears     Physical Exam   Triage Vital Signs: ED Triage Vitals  Encounter Vitals Group     BP 10/03/23 1630 (!) 146/101     Systolic BP Percentile --      Diastolic BP Percentile --      Pulse Rate 10/03/23 1630 78     Resp 10/03/23 1630 18     Temp 10/03/23 1630 98.6 F (37 C)     Temp src --      SpO2 10/03/23 1630 100 %     Weight --      Height --      Head Circumference --      Peak Flow --      Pain Score 10/03/23 1629 0     Pain Loc --      Pain Education --      Exclude from Growth Chart --     Most recent vital signs: Vitals:   10/03/23 1630 10/03/23 1844  BP: (!) 146/101 133/89  Pulse: 78 (!) 59  Resp: 18 18  Temp: 98.6 F (37 C)   SpO2: 100% 99%     General: Awake, no distress.  CV:  Good peripheral perfusion.  Resp:  Normal effort.  Abd:  No distention.  Soft, nontender Other:  Cranial nerves II through XII are normal, PERRLA, EOMI   ED Results / Procedures / Treatments   Labs (all labs ordered are listed, but only abnormal results are displayed) Labs Reviewed  URINALYSIS, ROUTINE W REFLEX MICROSCOPIC - Abnormal; Notable for the following components:      Result Value   Color, Urine YELLOW (*)    APPearance CLEAR (*)    All other components within normal limits  LIPASE, BLOOD  COMPREHENSIVE METABOLIC PANEL WITH  GFR  CBC     EKG     RADIOLOGY CT head viewed interpret by me, no acute abnormality, confirmed by radiology    PROCEDURES:  Critical Care performed:   Procedures   MEDICATIONS ORDERED IN ED: Medications  0.9 %  sodium chloride  infusion (0 mLs Intravenous Stopped 10/03/23 1813)  ondansetron  (ZOFRAN ) injection 4 mg (4 mg Intravenous Given 10/03/23 1715)     IMPRESSION / MDM / ASSESSMENT AND PLAN / ED COURSE  I reviewed the triage vital signs and the nursing notes. Patient's presentation is most consistent with acute presentation with potential threat to life or bodily function.  Patient presents with nausea as detailed above, differential includes electrolyte abnormality, vertigo, less likely CVA  Will send for CT head, obtain labs, treat with IV fluids, IV Zofran  and reevaluate.  Patient feeling better after treatment with fluids and Zofran , no nausea.  CT scan is reassuring, lab work is reassuring  He has PCP follow-up in 4 days, no  indication for admission at this time, appropriate for discharge, he and his wife agree with this plan.      FINAL CLINICAL IMPRESSION(S) / ED DIAGNOSES   Final diagnoses:  Nausea     Rx / DC Orders   ED Discharge Orders     None        Note:  This document was prepared using Dragon voice recognition software and may include unintentional dictation errors.   Bryson Carbine, MD 10/03/23 [phone]

## 2023-10-03 NOTE — ED Notes (Signed)
Pt ambulatory to restroom at this time for urine sample.

## 2023-10-03 NOTE — ED Notes (Signed)
Pt verbalizes understanding of discharge instructions. Opportunity for questioning and answers were provided. Pt discharged from ED to home with wife.    

## 2023-10-03 NOTE — ED Notes (Signed)
 Patient transported to CT

## 2023-10-06 ENCOUNTER — Other Ambulatory Visit: Payer: Self-pay

## 2023-10-06 ENCOUNTER — Emergency Department
Admission: EM | Admit: 2023-10-06 | Discharge: 2023-10-06 | Disposition: A | Discharge status: Home / Self Care | Attending: Emergency Medicine | Admitting: Emergency Medicine

## 2023-10-06 ENCOUNTER — Emergency Department

## 2023-10-06 DIAGNOSIS — R3915 Urgency of urination: Secondary | ICD-10-CM | POA: Insufficient documentation

## 2023-10-06 DIAGNOSIS — R1030 Lower abdominal pain, unspecified: Secondary | ICD-10-CM | POA: Diagnosis present

## 2023-10-06 DIAGNOSIS — I10 Essential (primary) hypertension: Secondary | ICD-10-CM | POA: Insufficient documentation

## 2023-10-06 LAB — URINALYSIS, COMPLETE (UACMP) WITH MICROSCOPIC
Bacteria, UA: NONE SEEN
Bilirubin Urine: NEGATIVE
Glucose, UA: NEGATIVE mg/dL
Hgb urine dipstick: NEGATIVE
Ketones, ur: NEGATIVE mg/dL
Leukocytes,Ua: NEGATIVE
Nitrite: NEGATIVE
Protein, ur: NEGATIVE mg/dL
Specific Gravity, Urine: 1.013 (ref 1.005–1.030)
Squamous Epithelial / HPF: 0 /HPF (ref 0–5)
pH: 6 (ref 5.0–8.0)

## 2023-10-06 LAB — COMPREHENSIVE METABOLIC PANEL WITH GFR
ALT: 21 U/L (ref 0–44)
AST: 20 U/L (ref 15–41)
Albumin: 4.3 g/dL (ref 3.5–5.0)
Alkaline Phosphatase: 64 U/L (ref 38–126)
Anion gap: 8 (ref 5–15)
BUN: 11 mg/dL (ref 8–23)
CO2: 25 mmol/L (ref 22–32)
Calcium: 9.3 mg/dL (ref 8.9–10.3)
Chloride: 108 mmol/L (ref 98–111)
Creatinine, Ser: 1.1 mg/dL (ref 0.61–1.24)
GFR, Estimated: >60 mL/min (ref >60)
Glucose, Bld: 92 mg/dL (ref 70–99)
Potassium: 3.7 mmol/L (ref 3.5–5.1)
Sodium: 141 mmol/L (ref 135–145)
Total Bilirubin: 0.7 mg/dL (ref 0.0–1.2)
Total Protein: 7.6 g/dL (ref 6.5–8.1)

## 2023-10-06 LAB — CBC
HCT: 40.6 % (ref 39.0–52.0)
Hemoglobin: 13.6 g/dL (ref 13.0–17.0)
MCH: 27.4 pg (ref 26.0–34.0)
MCHC: 33.5 g/dL (ref 30.0–36.0)
MCV: 81.7 fL (ref 80.0–100.0)
Platelets: 255 10*3/uL (ref 150–400)
RBC: 4.97 MIL/uL (ref 4.22–5.81)
RDW: 14.9 % (ref 11.5–15.5)
WBC: 5.8 10*3/uL (ref 4.0–10.5)
nRBC: 0 % (ref 0.0–0.2)

## 2023-10-06 MED ORDER — MORPHINE SULFATE (PF) 4 MG/ML IV SOLN
4.0000 mg | Freq: Once | INTRAVENOUS | Status: AC
  Administered 2023-10-06: 4 mg via INTRAVENOUS
  Filled 2023-10-06: qty 1

## 2023-10-06 MED ORDER — KETOROLAC TROMETHAMINE 30 MG/ML IJ SOLN
15.0000 mg | Freq: Once | INTRAMUSCULAR | Status: AC
  Administered 2023-10-06: 15 mg via INTRAVENOUS
  Filled 2023-10-06: qty 1

## 2023-10-06 MED ORDER — HYDROCODONE-ACETAMINOPHEN 5-325 MG PO TABS: 1.0000 | ORAL_TABLET | ORAL | 0 refills | Status: DC | PRN

## 2023-10-06 MED ORDER — PHENAZOPYRIDINE HCL 200 MG PO TABS: 200.0000 mg | ORAL_TABLET | Freq: Three times a day (TID) | ORAL | 0 refills | Status: DC | PRN

## 2023-10-06 MED ORDER — IOHEXOL 300 MG/ML  SOLN
100.0000 mL | Freq: Once | INTRAMUSCULAR | Status: AC | PRN
  Administered 2023-10-06: 100 mL via INTRAVENOUS

## 2023-10-06 NOTE — ED Triage Notes (Signed)
 Pt comes with c/o groin and stomach pain that started this morning. Pt states no urinary pain but does have urgency to go more.

## 2023-10-06 NOTE — ED Provider Notes (Signed)
 [facility] Provider Note    Event Date/Time   First MD Initiated Contact with Patient 10/06/23 1841     (approximate)  History   Chief Complaint: Abdominal Pain  HPI  Alex Castaneda is a 68 y.o. male with a past medical history of anemia, gastric reflux, hypertension, hyperlipidemia, presents to the emergency department for abdominal pain.  According to the patient for the past 13 days he has been feeling nauseated with no other real symptoms.  However over the weekend he then developed lower abdominal pain and the nausea went away.  Patient states for the last few days he has been experiencing pain across the lower abdomen.  Patient has a history of a prostate procedure previously for enlarged prostate.  Patient states he has had some mild urinary urgency recently but denies any dysuria.  No hematuria.  Physical Exam   Triage Vital Signs: ED Triage Vitals  Encounter Vitals Group     BP 10/06/23 1338 (!) 140/101     Systolic BP Percentile --      Diastolic BP Percentile --      Pulse Rate 10/06/23 1338 64     Resp 10/06/23 1338 18     Temp 10/06/23 1338 98 F (36.7 C)     Temp src --      SpO2 10/06/23 1338 98 %     Weight 10/06/23 1336 209 lb (94.8 kg)     Height 10/06/23 1336 5\' 7"  (1.702 m)     Head Circumference --      Peak Flow --      Pain Score 10/06/23 1336 7     Pain Loc --      Pain Education --      Exclude from Growth Chart --     Most recent vital signs: Vitals:   10/06/23 1338  BP: (!) 140/101  Pulse: 64  Resp: 18  Temp: 98 F (36.7 C)  SpO2: 98%    General: Awake, no distress.  CV:  Good peripheral perfusion.  Regular rate and rhythm  Resp:  Normal effort.  Equal breath sounds bilaterally.  Abd:  No distention.  Soft, nontender.  No rebound or guarding.  Completely benign abdominal exam on my evaluation.  Special attention paid to the lower abdomen.  ED Results / Procedures / Treatments   RADIOLOGY  I have reviewed  and interpreted the CT images of the abdomen.  I do not appreciate any bowel blockage or significant abnormality on my evaluation.  Awaiting radiology read.   MEDICATIONS ORDERED IN ED: Medications  ketorolac  (TORADOL ) 30 MG/ML injection 15 mg (15 mg Intravenous Given 10/06/23 1856)  iohexol  (OMNIPAQUE ) 300 MG/ML solution 100 mL (100 mLs Intravenous Contrast Given 10/06/23 1931)     IMPRESSION / MDM / ASSESSMENT AND PLAN / ED COURSE  I reviewed the triage vital signs and the nursing notes.  Patient's presentation is most consistent with acute presentation with potential threat to life or bodily function.  Patient presents emergency department for lower abdominal pain and some urinary urgency.  Overall the patient appears well, no distress.  Patient states several days ago he went to urgent care for nausea, was prescribed a nausea medication which seem to help the nausea somewhat.  He states over the weekend he developed lower abdominal pain.  States he saw his doctor.  Patient states he was referred to the emergency department for further evaluation.  Patient's workup today is so far reassuring with  a normal CBC with a normal white blood cell count, reassuring chemistry and a normal urinalysis.  No red cells or white cells seen on urinalysis.  However given the patient's complaint of lower abdominal pain we will obtain a CT scan of abdomen/pelvis to further evaluate.  Differential would include prostatitis, colitis or diverticulitis, mass or tumor, etc.  Patient's workup is reassuring.  Lab work is normal with a normal urinalysis normal CBC normal chemistry including LFTs.  Patient CT scan has resulted showing no acute process.  Given the patient's reassuring workup we will discharge nonprotruding and a very short course of pain medication have the patient follow-up with his doctor.  FINAL CLINICAL IMPRESSION(S) / ED DIAGNOSES   Lower abdominal pain   Note:  This document was prepared using  Dragon voice recognition software and may include unintentional dictation errors.   Ruth Cove, MD 10/06/23 2317

## 2023-10-07 ENCOUNTER — Ambulatory Visit

## 2023-10-07 ENCOUNTER — Ambulatory Visit: Admitting: Family Medicine

## 2023-10-09 ENCOUNTER — Telehealth: Payer: Self-pay | Admitting: Urology

## 2023-10-09 ENCOUNTER — Ambulatory Visit (INDEPENDENT_AMBULATORY_CARE_PROVIDER_SITE_OTHER): Admitting: Family Medicine

## 2023-10-09 ENCOUNTER — Encounter: Payer: Self-pay | Admitting: Family Medicine

## 2023-10-09 VITALS — BP 120/78 | HR 68 | Temp 98.9°F | Ht 67.0 in | Wt 208.2 lb

## 2023-10-09 DIAGNOSIS — R350 Frequency of micturition: Secondary | ICD-10-CM | POA: Diagnosis not present

## 2023-10-09 DIAGNOSIS — R3912 Poor urinary stream: Secondary | ICD-10-CM | POA: Diagnosis not present

## 2023-10-09 DIAGNOSIS — R102 Pelvic and perineal pain: Secondary | ICD-10-CM | POA: Diagnosis not present

## 2023-10-09 DIAGNOSIS — R110 Nausea: Secondary | ICD-10-CM

## 2023-10-09 DIAGNOSIS — R1024 Suprapubic pain: Secondary | ICD-10-CM | POA: Insufficient documentation

## 2023-10-09 MED ORDER — ONDANSETRON HCL 8 MG PO TABS: 8.0000 mg | ORAL_TABLET | Freq: Three times a day (TID) | ORAL | 0 refills | Status: DC | PRN

## 2023-10-09 NOTE — Progress Notes (Signed)
 Patient ID: Alex Castaneda, male    DOB: [birth year], 68 y.o.   MRN: [id number]  This visit was conducted in person.  BP 120/78 (BP Location: Left Arm, Patient Position: Sitting, Cuff Size: Large)   Pulse 68   Temp 98.9 F (37.2 C) (Temporal)   Ht 5\' 7"  (1.702 m)   Wt 208 lb 4 oz (94.5 kg)   SpO2 96%   BMI 32.62 kg/m    CC:  Chief Complaint  Patient presents with   Follow-up    ER-04/18 and 04/21 for Nausea/Low Abd Pain    Subjective:   HPI: Alex Castaneda is a 68 y.o. male presenting on 10/09/2023 for Follow-up (ER-04/18 and 04/21 for Nausea/Low Abd Pain)  Seen at ER on 4/18 and 4/21 Seen initially for new onset nausea C-Met, lipase, CT head without contrast and UA unremarkable Treated with IV fluids, Zofran  symptoms resolved.  Patient return to ER 3 days later with continued nausea and new onset lower abdominal pain.... suprapubic Had TURP approximately 5 weeks ago.  CT abdomen/pelvis:. No acute abnormality in the abdomen or pelvis. 2. Hepatic steatosis. 3. Small fat containing left inguinal hernia.  Labs unremarkable No clear cause of symptoms found.  Today he reports he has started pyridium  and pain medication...  helps with pain... but it comes back when pain meds wear off. Has also noted weaker stream, increase in frequency.  Does not feel like bladder fully emptying.  Has not seen urologist.  No diarrhea, no constipation.. BM 2 days ago. No blood in stool.  B12 low normal.   Relevant past medical, surgical, family and social history reviewed and updated as indicated. Interim medical history since our last visit reviewed. Allergies and medications reviewed and updated. Outpatient Medications Prior to Visit  Medication Sig Dispense Refill   acetaminophen  (TYLENOL ) 500 MG tablet Take 500-1,000 mg by mouth every 6 (six) hours as needed (pain.).     amLODipine  (NORVASC ) 5 MG tablet TAKE 1 TABLET(5 MG) BY MOUTH DAILY 90 tablet 3   carboxymethylcellulose (REFRESH  PLUS) 0.5 % SOLN Place 1 drop into both eyes 3 (three) times daily as needed (dry/irritated eyes.).     clindamycin -benzoyl peroxide (BENZACLIN) gel APPLY TOPICALLY TWICE DAILY TO AFFECTED AREA ON FACE, TRUNK 50 g 0   gabapentin  (NEURONTIN ) 300 MG capsule Take 300 mg by mouth 3 (three) times daily as needed.     HYDROcodone -acetaminophen  (NORCO/VICODIN) 5-325 MG tablet Take 1 tablet by mouth every 4 (four) hours as needed. 15 tablet 0   losartan  (COZAAR ) 25 MG tablet Take 1 tablet (25 mg total) by mouth daily. 90 tablet 3   meloxicam  (MOBIC ) 15 MG tablet Take 15 mg by mouth at bedtime as needed for pain.     pantoprazole  (PROTONIX ) 20 MG tablet Take 1 tablet (20 mg total) by mouth daily. 90 tablet 0   phenazopyridine  (PYRIDIUM ) 200 MG tablet Take 1 tablet (200 mg total) by mouth 3 (three) times daily as needed for pain. 20 tablet 0   gabapentin  (NEURONTIN ) 300 MG capsule Take 1 capsule (300 mg total) by mouth 3 (three) times daily. (Patient taking differently: Take 300 mg by mouth 3 (three) times daily as needed (pain.).) 90 capsule 0   No facility-administered medications prior to visit.     Per HPI unless specifically indicated in ROS section below Review of Systems  Constitutional:  Negative for fatigue and fever.  HENT:  Negative for ear pain.   Eyes:  Negative for pain.  Respiratory:  Negative for cough and shortness of breath.   Cardiovascular:  Negative for chest pain, palpitations and leg swelling.  Gastrointestinal:  Negative for abdominal pain.  Genitourinary:  Negative for dysuria.  Musculoskeletal:  Negative for arthralgias.  Neurological:  Negative for syncope, light-headedness and headaches.  Psychiatric/Behavioral:  Negative for dysphoric mood.    Objective:  BP 120/78 (BP Location: Left Arm, Patient Position: Sitting, Cuff Size: Large)   Pulse 68   Temp 98.9 F (37.2 C) (Temporal)   Ht 5\' 7"  (1.702 m)   Wt 208 lb 4 oz (94.5 kg)   SpO2 96%   BMI 32.62 kg/m   Wt  Readings from Last 3 Encounters:  10/09/23 208 lb 4 oz (94.5 kg)  10/06/23 209 lb (94.8 kg)  09/30/23 211 lb 9.6 oz (96 kg)      Physical Exam Constitutional:      Appearance: He is well-developed.  HENT:     Head: Normocephalic.     Right Ear: Hearing normal.     Left Ear: Hearing normal.     Nose: Nose normal.  Neck:     Thyroid : No thyroid  mass or thyromegaly.     Vascular: No carotid bruit.     Trachea: Trachea normal.  Cardiovascular:     Rate and Rhythm: Normal rate and regular rhythm.     Pulses: Normal pulses.     Heart sounds: Heart sounds not distant. No murmur heard.    No friction rub. No gallop.     Comments: No peripheral edema Pulmonary:     Effort: Pulmonary effort is normal. No respiratory distress.     Breath sounds: Normal breath sounds.  Skin:    General: Skin is warm and dry.     Findings: No rash.  Psychiatric:        Speech: Speech normal.        Behavior: Behavior normal.        Thought Content: Thought content normal.       Results for orders placed or performed during the hospital encounter of 10/06/23  CBC   Collection Time: 10/06/23  1:14 PM  Result Value Ref Range   WBC 5.8 4.0 - 10.5 K/uL   RBC 4.97 4.22 - 5.81 MIL/uL   Hemoglobin 13.6 13.0 - 17.0 g/dL   HCT 86.5 78.4 - 69.6 %   MCV 81.7 80.0 - 100.0 fL   MCH 27.4 26.0 - 34.0 pg   MCHC 33.5 30.0 - 36.0 g/dL   RDW 29.5 28.4 - 13.2 %   Platelets 255 150 - 400 K/uL   nRBC 0.0 0.0 - 0.2 %  Comprehensive metabolic panel   Collection Time: 10/06/23  1:14 PM  Result Value Ref Range   Sodium 141 135 - 145 mmol/L   Potassium 3.7 3.5 - 5.1 mmol/L   Chloride 108 98 - 111 mmol/L   CO2 25 22 - 32 mmol/L   Glucose, Bld 92 70 - 99 mg/dL   BUN 11 8 - 23 mg/dL   Creatinine, Ser 4.40 0.61 - 1.24 mg/dL   Calcium 9.3 8.9 - 10.2 mg/dL   Total Protein 7.6 6.5 - 8.1 g/dL   Albumin 4.3 3.5 - 5.0 g/dL   AST 20 15 - 41 U/L   ALT 21 0 - 44 U/L   Alkaline Phosphatase 64 38 - 126 U/L   Total  Bilirubin 0.7 0.0 - 1.2 mg/dL   GFR, Estimated >72 >53 mL/min   Anion gap 8  5 - 15  Urinalysis, Complete w Microscopic -Urine, Clean Catch   Collection Time: 10/06/23  3:00 PM  Result Value Ref Range   Color, Urine YELLOW (A) YELLOW   APPearance CLEAR (A) CLEAR   Specific Gravity, Urine 1.013 1.005 - 1.030   pH 6.0 5.0 - 8.0   Glucose, UA NEGATIVE NEGATIVE mg/dL   Hgb urine dipstick NEGATIVE NEGATIVE   Bilirubin Urine NEGATIVE NEGATIVE   Ketones, ur NEGATIVE NEGATIVE mg/dL   Protein, ur NEGATIVE NEGATIVE mg/dL   Nitrite NEGATIVE NEGATIVE   Leukocytes,Ua NEGATIVE NEGATIVE   RBC / HPF 0-5 0 - 5 RBC/hpf   WBC, UA 0-5 0 - 5 WBC/hpf   Bacteria, UA NONE SEEN NONE SEEN   Squamous Epithelial / HPF 0 0 - 5 /HPF   Mucus PRESENT    Sperm, UA PRESENT    Wt Readings from Last 3 Encounters:  10/09/23 208 lb 4 oz (94.5 kg)  10/06/23 209 lb (94.8 kg)  09/30/23 211 lb 9.6 oz (96 kg)     Assessment and Plan  Nausea  Suprapubic pain  Weak urinary stream  Urinary frequency  Other orders -     Ondansetron  HCl; Take 1 tablet (8 mg total) by mouth every 8 (eight) hours as needed for nausea or vomiting.  Dispense: 20 tablet; Refill: 0   This gentleman has had an extensive workup so far for his nausea and suprapubic pain.  Including normal repeat labs, clear urine sample, normal head CT and abdominal CT. His symptoms are most consistent with irritation of bladder or urethra.  Pyridium  is what has helped him the most.  I have encouraged him to return to see his urologist at this point for further recommendations. He does not use NSAIDs or any other bladder irritant medications.  He already avoids foods and drinks that irritate the bladder like alcohol, caffeine and citrus. He will continue to push fluids. Refilled Zofran  but sent in tablet form as he feels the disintegrating tablets did not help as much.  Return and ER precautions provided. No follow-ups on file.   Herby Lolling, MD

## 2023-10-09 NOTE — Telephone Encounter (Signed)
 ERROR

## 2023-10-10 ENCOUNTER — Encounter: Payer: Self-pay | Admitting: Family Medicine

## 2023-10-10 ENCOUNTER — Other Ambulatory Visit: Payer: Self-pay | Admitting: Family Medicine

## 2023-10-10 DIAGNOSIS — K529 Noninfective gastroenteritis and colitis, unspecified: Secondary | ICD-10-CM

## 2023-10-10 DIAGNOSIS — R110 Nausea: Secondary | ICD-10-CM

## 2023-10-20 ENCOUNTER — Ambulatory Visit (INDEPENDENT_AMBULATORY_CARE_PROVIDER_SITE_OTHER): Admitting: Physician Assistant

## 2023-10-20 ENCOUNTER — Encounter: Payer: Self-pay | Admitting: Physician Assistant

## 2023-10-20 VITALS — BP 138/85 | HR 68 | Ht 67.0 in | Wt 205.0 lb

## 2023-10-20 DIAGNOSIS — N138 Other obstructive and reflux uropathy: Secondary | ICD-10-CM

## 2023-10-20 DIAGNOSIS — N401 Enlarged prostate with lower urinary tract symptoms: Secondary | ICD-10-CM | POA: Diagnosis not present

## 2023-10-20 LAB — MICROSCOPIC EXAMINATION

## 2023-10-20 LAB — BLADDER SCAN AMB NON-IMAGING

## 2023-10-20 MED ORDER — SILODOSIN 8 MG PO CAPS: 8.0000 mg | ORAL_CAPSULE | Freq: Every day | ORAL | 2 refills | Status: DC

## 2023-10-21 ENCOUNTER — Ambulatory Visit (INDEPENDENT_AMBULATORY_CARE_PROVIDER_SITE_OTHER): Admitting: Family Medicine

## 2023-10-21 ENCOUNTER — Encounter: Payer: Self-pay | Admitting: Family Medicine

## 2023-10-21 VITALS — BP 130/90 | HR 73 | Temp 98.5°F | Ht 67.0 in | Wt 207.1 lb

## 2023-10-21 DIAGNOSIS — R110 Nausea: Secondary | ICD-10-CM

## 2023-10-21 DIAGNOSIS — R102 Pelvic and perineal pain: Secondary | ICD-10-CM

## 2023-10-21 DIAGNOSIS — K219 Gastro-esophageal reflux disease without esophagitis: Secondary | ICD-10-CM

## 2023-10-21 LAB — MICROSCOPIC EXAMINATION

## 2023-10-21 LAB — URINALYSIS, COMPLETE
Bilirubin, UA: NEGATIVE
Glucose, UA: NEGATIVE
Ketones, UA: NEGATIVE
Leukocytes,UA: NEGATIVE
Nitrite, UA: NEGATIVE
Protein,UA: NEGATIVE
RBC, UA: NEGATIVE
Specific Gravity, UA: 1.02 (ref 1.005–1.030)
Urobilinogen, Ur: 0.2 mg/dL (ref 0.2–1.0)
pH, UA: 6 (ref 5.0–7.5)

## 2023-10-21 NOTE — Progress Notes (Signed)
 10/20/2023 5:26 PM   Alex Castaneda Jul 08, 1955 [id number]  CC: Chief Complaint  Patient presents with   Other   HPI: Alex Castaneda is a 68 y.o. male with PMH BPH with urinary obstruction s/p TUIP with Dr. Estanislao Heimlich on 08/22/2023 who presents today for evaluation of nausea and suprapubic pain.   Today he reports that approximately 1 month after his surgery, he developed sudden onset, alternating nausea and suprapubic pain as well as occasional mild dysuria.  His bowel movements were rather regular until last week, when he did have an episode of constipation that exacerbated his other symptoms, however this has since resolved.  He has been seen in the emergency department twice for this, and the workup has been negative.  He had a CTAP with contrast on 10/06/2023 that showed no hydronephrosis, stones, or stranding around the kidneys or bladder.  His bladder was not particularly distended at the time.  In-office UA and microscopy today pan negative. PVR 0mL.  PMH: Past Medical History:  Diagnosis Date   Abnormal LFTs 05/15/2022   Anemia 09/30/2023   Arthritis    Benign prostatic hyperplasia with urinary hesitancy 03/05/2023   Centrilobular emphysema (HCC) 09/30/2023   Colon polyps    GERD (gastroesophageal reflux disease)    Hyperlipidemia    Hypertension    Osteoarthritis 07/13/2019   Spinal stenosis of lumbar region 07/20/2019   Thyroid  disease    Trigeminal neuralgia 07/19/2019    Surgical History: Past Surgical History:  Procedure Laterality Date   COLONOSCOPY WITH PROPOFOL  N/A 01/01/2021   Procedure: COLONOSCOPY WITH PROPOFOL ;  Surgeon: Irby Mannan, MD;  Location: [facility];  Service: Endoscopy;  Laterality: N/A;   ELBOW SURGERY Right    EYE SURGERY  2000   HERNIA REPAIR     Umbilical   KNEE SURGERY Left    arthroscopic   ROTATOR CUFF REPAIR Right    TRANSURETHRAL INCISION OF PROSTATE N/A 08/22/2023   Procedure: INCISION, PROSTATE, TRANSURETHRAL;  Surgeon:  Lawerence Pressman, MD;  Location: [facility] ORS;  Service: Urology;  Laterality: N/A;    Home Medications:  Allergies as of 10/20/2023       Reactions   Salicylates    Other reaction(s): Unknown to Patient//Family        Medication List        Accurate as of Oct 20, 2023 11:59 PM. If you have any questions, ask your nurse or doctor.          acetaminophen  500 MG tablet Commonly known as: TYLENOL  Take 500-1,000 mg by mouth every 6 (six) hours as needed (pain.).   amLODipine  5 MG tablet Commonly known as: NORVASC  TAKE 1 TABLET(5 MG) BY MOUTH DAILY   carboxymethylcellulose 0.5 % Soln Commonly known as: REFRESH PLUS Place 1 drop into both eyes 3 (three) times daily as needed (dry/irritated eyes.).   clindamycin -benzoyl peroxide gel Commonly known as: BENZACLIN APPLY TOPICALLY TWICE DAILY TO AFFECTED AREA ON FACE, TRUNK   gabapentin  300 MG capsule Commonly known as: NEURONTIN  Take 300 mg by mouth 3 (three) times daily as needed.   HYDROcodone -acetaminophen  5-325 MG tablet Commonly known as: NORCO/VICODIN Take 1 tablet by mouth every 4 (four) hours as needed.   losartan  25 MG tablet Commonly known as: COZAAR  Take 1 tablet (25 mg total) by mouth daily.   meloxicam  15 MG tablet Commonly known as: MOBIC  Take 15 mg by mouth at bedtime as needed for pain.   ondansetron  8 MG tablet Commonly known as: ZOFRAN  Take  1 tablet (8 mg total) by mouth every 8 (eight) hours as needed for nausea or vomiting.   pantoprazole  20 MG tablet Commonly known as: Protonix  Take 1 tablet (20 mg total) by mouth daily.   phenazopyridine  200 MG tablet Commonly known as: Pyridium  Take 1 tablet (200 mg total) by mouth 3 (three) times daily as needed for pain.   silodosin 8 MG Caps capsule Commonly known as: RAPAFLO Take 1 capsule (8 mg total) by mouth daily with breakfast. Started by: Jernie Schutt        Allergies:  Allergies  Allergen Reactions   Salicylates     Other  reaction(s): Unknown to Patient//Family    Family History: Family History  Problem Relation Age of Onset   Throat cancer Mother        smoker   COPD Sister    Cancer Brother        unknown   Arthritis Sister    Diabetes Sister    Diabetes Sister    Stroke Brother 12       tobacco use    Social History:   reports that he quit smoking about 34 years ago. His smoking use included cigarettes. He has been exposed to tobacco smoke. He has never used smokeless tobacco. He reports that he does not currently use alcohol. He reports that he does not use drugs.  Physical Exam: BP 138/85   Pulse 68   Ht 5\' 7"  (1.702 m)   Wt 205 lb (93 kg)   BMI 32.11 kg/m   Constitutional:  Alert and oriented, no acute distress, nontoxic appearing HEENT: [place], AT Cardiovascular: No clubbing, cyanosis, or edema Respiratory: Normal respiratory effort, no increased work of breathing Skin: No rashes, bruises or suspicious lesions Neurologic: Grossly intact, no focal deficits, moving all 4 extremities Psychiatric: Normal mood and affect  Laboratory Data: Results for orders placed or performed in visit on 10/20/23  Microscopic Examination   Collection Time: 10/20/23  3:01 PM   Urine  Result Value Ref Range   WBC, UA 0-5 0 - 5 /hpf   RBC, Urine 0-2 0 - 2 /hpf   Epithelial Cells (non renal) 0-10 0 - 10 /hpf   Bacteria, UA None seen None seen/Few  Urinalysis, Complete   Collection Time: 10/20/23  3:01 PM  Result Value Ref Range   Specific Gravity, UA 1.020 1.005 - 1.030   pH, UA 6.0 5.0 - 7.5   Color, UA Yellow Yellow   Appearance Ur Clear Clear   Leukocytes,UA Negative Negative   Protein,UA Negative Negative/Trace   Glucose, UA Negative Negative   Ketones, UA Negative Negative   RBC, UA Negative Negative   Bilirubin, UA Negative Negative   Urobilinogen, Ur 0.2 0.2 - 1.0 mg/dL   Nitrite, UA Negative Negative   Microscopic Examination Comment    Microscopic Examination See below:   BLADDER SCAN  AMB NON-IMAGING   Collection Time: 10/20/23  3:23 PM  Result Value Ref Range   Scan Result 0ml    Assessment & Plan:   1. Benign prostatic hyperplasia with urinary obstruction (Primary) His UA today is bland, he is emptying appropriately, and no significant findings on recent contrast-enhanced cross-sectional imaging.  Unfortunately, I do not have an obvious explanation for his symptoms.  Extremely low suspicion for infection in the absence of pyuria, or visible abscess or stranding on recent CT.  This could possibly be an inflammatory prostatitis, however nausea would be an unlikely presentation for this.  Will  start empiric Rapaflo to see if it helps.  Otherwise, I encouraged him to follow-up with his PCP. - Urinalysis, Complete - BLADDER SCAN AMB NON-IMAGING - silodosin (RAPAFLO) 8 MG CAPS capsule; Take 1 capsule (8 mg total) by mouth daily with breakfast.  Dispense: 30 capsule; Refill: 2  Return if symptoms worsen or fail to improve.  Kathreen Pare, PA-C  [facility] [place] [address], [address] [place], [place] [address] [phone]

## 2023-10-21 NOTE — Patient Instructions (Signed)
[facility] ?[phone] ?

## 2023-10-21 NOTE — Progress Notes (Signed)
 Patient ID: Alex Castaneda, male    DOB: 1955/08/16, 68 y.o.   MRN: [id number]  This visit was conducted in person.  BP (!) 130/90   Pulse 73   Temp 98.5 F (36.9 C) (Temporal)   Ht 5\' 7"  (1.702 m)   Wt 207 lb 2 oz (94 kg)   SpO2 99%   BMI 32.44 kg/m    CC:  Chief Complaint  Patient presents with   Umbilical Hernia    Subjective:   HPI: Alex Castaneda is a 68 y.o. male presenting on 10/21/2023 for Umbilical Hernia  Recent ED visits for nausea post TURP 6 weeks ago Seen at ER on 4/18 and 4/21 Seen initially for new onset nausea, then low abd pain C-Met, lipase, CT head without contrast and UA unremarkable Treated with IV fluids, Zofran  Pyridium  was what helped the most.   CT scan showed: IMPRESSION: 1. No acute abnormality in the abdomen or pelvis. 2. Hepatic steatosis. 3. Small fat containing left inguinal hernia.  Saw Urology 10/20/2023  Bladder scan was normal.  Flushed out urethra, no blockage.  Started on rapaflo ... causing some SE... lower abd pain may be better.  Wt Readings from Last 3 Encounters:  10/29/23 209 lb (94.8 kg)  10/21/23 207 lb 2 oz (94 kg)  10/20/23 205 lb (93 kg)   1 week ago he was constipated from Zofran .. strained a lot... noted some tenderness at umbilicus.  No redness.  No tenderness today.  S/P past umbilical hernia repair.     Relevant past medical, surgical, family and social history reviewed and updated as indicated. Interim medical history since our last visit reviewed. Allergies and medications reviewed and updated. Outpatient Medications Prior to Visit  Medication Sig Dispense Refill   acetaminophen  (TYLENOL ) 500 MG tablet Take 500-1,000 mg by mouth every 6 (six) hours as needed (pain.).     amLODipine  (NORVASC ) 5 MG tablet TAKE 1 TABLET(5 MG) BY MOUTH DAILY 90 tablet 3   carboxymethylcellulose (REFRESH PLUS) 0.5 % SOLN Place 1 drop into both eyes 3 (three) times daily as needed (dry/irritated eyes.).     clindamycin -benzoyl  peroxide (BENZACLIN) gel APPLY TOPICALLY TWICE DAILY TO AFFECTED AREA ON FACE, TRUNK 50 g 0   gabapentin  (NEURONTIN ) 300 MG capsule Take 300 mg by mouth 3 (three) times daily as needed.     HYDROcodone -acetaminophen  (NORCO/VICODIN) 5-325 MG tablet Take 1 tablet by mouth every 4 (four) hours as needed. 15 tablet 0   losartan  (COZAAR ) 25 MG tablet Take 1 tablet (25 mg total) by mouth daily. 90 tablet 3   meloxicam  (MOBIC ) 15 MG tablet Take 15 mg by mouth at bedtime as needed for pain.     ondansetron  (ZOFRAN ) 8 MG tablet Take 1 tablet (8 mg total) by mouth every 8 (eight) hours as needed for nausea or vomiting. (Patient not taking: Reported on 10/29/2023) 20 tablet 0   pantoprazole  (PROTONIX ) 20 MG tablet Take 1 tablet (20 mg total) by mouth daily. (Patient not taking: Reported on 10/29/2023) 90 tablet 0   silodosin  (RAPAFLO ) 8 MG CAPS capsule Take 1 capsule (8 mg total) by mouth daily with breakfast. (Patient not taking: Reported on 10/29/2023) 30 capsule 2   phenazopyridine  (PYRIDIUM ) 200 MG tablet Take 1 tablet (200 mg total) by mouth 3 (three) times daily as needed for pain. 20 tablet 0   No facility-administered medications prior to visit.     Per HPI unless specifically indicated in ROS section below Review  of Systems  Constitutional:  Negative for fatigue and fever.  HENT:  Negative for ear pain.   Eyes:  Negative for pain.  Respiratory:  Negative for cough and shortness of breath.   Cardiovascular:  Negative for chest pain, palpitations and leg swelling.  Gastrointestinal:  Positive for abdominal pain and nausea.  Genitourinary:  Negative for dysuria.  Musculoskeletal:  Negative for arthralgias.  Neurological:  Negative for syncope, light-headedness and headaches.  Psychiatric/Behavioral:  Negative for dysphoric mood.    Objective:  BP (!) 130/90   Pulse 73   Temp 98.5 F (36.9 C) (Temporal)   Ht 5\' 7"  (1.702 m)   Wt 207 lb 2 oz (94 kg)   SpO2 99%   BMI 32.44 kg/m   Wt Readings  from Last 3 Encounters:  10/29/23 209 lb (94.8 kg)  10/21/23 207 lb 2 oz (94 kg)  10/20/23 205 lb (93 kg)      Physical Exam Vitals reviewed.  Constitutional:      Appearance: He is well-developed.  HENT:     Head: Normocephalic.     Right Ear: Hearing normal.     Left Ear: Hearing normal.     Nose: Nose normal.  Neck:     Thyroid : No thyroid  mass or thyromegaly.     Vascular: No carotid bruit.     Trachea: Trachea normal.  Cardiovascular:     Rate and Rhythm: Normal rate and regular rhythm.     Pulses: Normal pulses.     Heart sounds: Heart sounds not distant. No murmur heard.    No friction rub. No gallop.     Comments: No peripheral edema Pulmonary:     Effort: Pulmonary effort is normal. No respiratory distress.     Breath sounds: Normal breath sounds.  Abdominal:     Tenderness: There is abdominal tenderness in the suprapubic area.  Skin:    General: Skin is warm and dry.     Findings: No rash.  Psychiatric:        Speech: Speech normal.        Behavior: Behavior normal.        Thought Content: Thought content normal.       Results for orders placed or performed in visit on 10/20/23  Microscopic Examination   Collection Time: 10/20/23  3:01 PM   Urine  Result Value Ref Range   WBC, UA 0-5 0 - 5 /hpf   RBC, Urine 0-2 0 - 2 /hpf   Epithelial Cells (non renal) 0-10 0 - 10 /hpf   Bacteria, UA None seen None seen/Few  Urinalysis, Complete   Collection Time: 10/20/23  3:01 PM  Result Value Ref Range   Specific Gravity, UA 1.020 1.005 - 1.030   pH, UA 6.0 5.0 - 7.5   Color, UA Yellow Yellow   Appearance Ur Clear Clear   Leukocytes,UA Negative Negative   Protein,UA Negative Negative/Trace   Glucose, UA Negative Negative   Ketones, UA Negative Negative   RBC, UA Negative Negative   Bilirubin, UA Negative Negative   Urobilinogen, Ur 0.2 0.2 - 1.0 mg/dL   Nitrite, UA Negative Negative   Microscopic Examination Comment    Microscopic Examination See below:    BLADDER SCAN AMB NON-IMAGING   Collection Time: 10/20/23  3:23 PM  Result Value Ref Range   Scan Result 0ml     Assessment and Plan  Suprapubic pain  Chronic nausea  Given minimal improvement we will go ahead and refer  to GI.  Patient will continue Rapaflo  as directed by urology.  Symptoms could still be due to prostatitis or other side effect to recent surgery.  Encouraged patient to also follow-up with urology if not improving.  No follow-ups on file.   Herby Lolling, MD

## 2023-10-22 ENCOUNTER — Encounter: Payer: Self-pay | Admitting: *Deleted

## 2023-10-22 ENCOUNTER — Encounter: Payer: Self-pay | Admitting: Internal Medicine

## 2023-10-22 ENCOUNTER — Telehealth: Payer: Self-pay

## 2023-10-22 ENCOUNTER — Ambulatory Visit: Admitting: Family Medicine

## 2023-10-22 DIAGNOSIS — R110 Nausea: Secondary | ICD-10-CM

## 2023-10-22 NOTE — Telephone Encounter (Signed)
 Copied from CRM [id number]. Topic: Referral - Question >> Oct 22, 2023 11:35 AM Allyne Areola wrote: Reason for CRM: Patient would like to know if we would be able to send a referral to [facility] in [place] instead of [facility]. They are unable to see him until 12/16/2023 and [facility] would be able to fit him in this week.

## 2023-10-22 NOTE — Telephone Encounter (Signed)
 Per patient call [facility] can see him ASAP... can we send the referral to them instead please.

## 2023-10-23 NOTE — Telephone Encounter (Signed)
 I believe this is already been taken care of yesterday.  Note from [facility] Bullock says he has been scheduled, Pt is scheduled with [facility] on 10/29/23

## 2023-10-29 ENCOUNTER — Ambulatory Visit: Admitting: Gastroenterology

## 2023-10-29 ENCOUNTER — Encounter: Payer: Self-pay | Admitting: Gastroenterology

## 2023-10-29 VITALS — BP 133/82 | HR 68 | Temp 97.8°F | Ht 67.0 in | Wt 209.0 lb

## 2023-10-29 DIAGNOSIS — R110 Nausea: Secondary | ICD-10-CM

## 2023-10-29 DIAGNOSIS — D509 Iron deficiency anemia, unspecified: Secondary | ICD-10-CM | POA: Insufficient documentation

## 2023-10-29 DIAGNOSIS — K219 Gastro-esophageal reflux disease without esophagitis: Secondary | ICD-10-CM | POA: Diagnosis not present

## 2023-10-29 DIAGNOSIS — Z860102 Personal history of hyperplastic colon polyps: Secondary | ICD-10-CM | POA: Diagnosis not present

## 2023-10-29 DIAGNOSIS — R1030 Lower abdominal pain, unspecified: Secondary | ICD-10-CM | POA: Diagnosis not present

## 2023-10-29 DIAGNOSIS — K760 Fatty (change of) liver, not elsewhere classified: Secondary | ICD-10-CM | POA: Insufficient documentation

## 2023-10-29 MED ORDER — PEG 3350-KCL-NA BICARB-NACL 420 G PO SOLR: 4000.0000 mL | Freq: Once | ORAL | 0 refills | Status: AC

## 2023-10-29 NOTE — Patient Instructions (Signed)
 We are arranging a colonoscopy and upper endoscopy with Dr. Alita Irwin in the near future.   Please have labs done today at [facility], and I will message with results.  We will see you back in 2 months!  It was a pleasure to see you today. I want to create trusting relationships with patients and provide genuine, compassionate, and quality care. I truly value your feedback, so please be on the lookout for a survey regarding your visit with me today. I appreciate your time in completing this!         Delman Ferns, PhD, ANP-BC [facility]

## 2023-10-29 NOTE — H&P (View-Only) (Signed)
 Gastroenterology Office Note    Referring Provider: Judithann Novas, MD Primary Care Physician:  Judithann Novas, MD  Primary GI: Dr. Alita Irwin , previously Dr. Tully Gainer ([facility])   Chief Complaint   Chief Complaint  Patient presents with   Abdominal Pain    Having low abdominal/pelvic pain. Started about 6 weeks ago. Pain is like a ache and some pressure.  States it radiates up into stomach. Taking hydrocodone  for the pain. Went to ED on 4/18 and 4/21 for pain and nausea.    Gastroesophageal Reflux    States pantoprazole  did not work and taking otc prilosec as needed.     History of Present Illness   Alex Castaneda is a 68 y.o. male presenting today at the request of Bedsole, Amy E, MD due to abdominal/pelvic pain onset about 6 weeks ago, nausea, and GERD. Medical history significant for BPH and urinary obstruction s/p TUIP ( transurethral incision of prostate) in March 2025, GERD, HLD, HTN, OA, thyroid  disease, hepatic steatosis, evolving IDA as noted in below labs, presenting for further GI evaluation. Prior colonoscopy in 2022 with hyperplastic polyp. No known FH colon cancer or polyps.  Patient has seen Urology due to pelvic pain (10/20/23) and did not feel that pelvic pain was r/t prior BPH with transurethral incision of prostate in March 2025. Urine clean at that time and without any flow issues. Urology had low suspicion for infection but unable to r/o prostatitis. Empiric Rapaflo  was started.   About 6 weeks ago became very nauseated while coming home from an appt and went to urgent care. CT head without acute abnormality. Zofran  mildly helped. He then went again to urgent care several days later due to suprapubic pain. CT abd/pelvis with contrast without acute abnormality. Small fat-containing left inguinal hernia. Hepatic steatosis.  Constant lower pelvic pain that waxes and wanes in intensity, hydrocodone  if severe, tylenol  without help. Nausea intermittently. Has noticed  if increased nausea, less pain, and if increased pain, less nausea. Last few days lower pain radiating up both side s of abdomen. No pain after eating.    Noted fatigue initially but better now. Zofran  mild help from PCP. Pantoprazole  not much improvement with nausea.  Previously had taken Prilosec prn, will go weeks without it and not need. No dysphagia. Worsened GERD if ketchup. Good appetite now but no appetite when suprapubic pain started.BM every few days. Takes Miralax if no BM in a few days. Pain not related to BMs. No improvement after BMs. Had loss of appetite during these 6 weeks but now improving. No diarrhea. Nausea has started to slowly improve over past week.  No rectal bleeding, no melena. Avoids NSAIDs other than meloxicam  that is rare.    Dec 2024: Low iron 41, ferritin 23, started taking iron supplements. Hgb 13/14 range.   FH: no colon polyps No colon cancer   [facility] July 2022: One 6 mm polyp in the sigmoid colon, removed with a cold snare. Resected and retrieved. - The examination was otherwise normal. - The rectum, sigmoid colon, descending colon, transverse colon, ascending colon and cecum are normal. - Non- bleeding internal hemorrhoids. Hyperplastic polyp.   EGD many years ago in [place] .   Past Medical History:  Diagnosis Date   Abnormal LFTs 05/15/2022   Anemia 09/30/2023   Arthritis    Benign prostatic hyperplasia with urinary hesitancy 03/05/2023   Centrilobular emphysema (HCC) 09/30/2023   Colon polyps    GERD (gastroesophageal reflux disease)  Hyperlipidemia    Hypertension    Osteoarthritis 07/13/2019   Spinal stenosis of lumbar region 07/20/2019   Thyroid  disease    Trigeminal neuralgia 07/19/2019    Past Surgical History:  Procedure Laterality Date   COLONOSCOPY WITH PROPOFOL  N/A 01/01/2021   Procedure: COLONOSCOPY WITH PROPOFOL ;  Surgeon: Irby Mannan, MD;  Location: [facility] ENDOSCOPY;  Service: Endoscopy;  Laterality: N/A;   ELBOW  SURGERY Right    EYE SURGERY  2000   HERNIA REPAIR     Umbilical   KNEE SURGERY Left    arthroscopic   ROTATOR CUFF REPAIR Right    TRANSURETHRAL INCISION OF PROSTATE N/A 08/22/2023   Procedure: INCISION, PROSTATE, TRANSURETHRAL;  Surgeon: Lawerence Pressman, MD;  Location: [facility] ORS;  Service: Urology;  Laterality: N/A;    Current Outpatient Medications  Medication Sig Dispense Refill   acetaminophen  (TYLENOL ) 500 MG tablet Take 500-1,000 mg by mouth every 6 (six) hours as needed (pain.).     amLODipine  (NORVASC ) 5 MG tablet TAKE 1 TABLET(5 MG) BY MOUTH DAILY 90 tablet 3   carboxymethylcellulose (REFRESH PLUS) 0.5 % SOLN Place 1 drop into both eyes 3 (three) times daily as needed (dry/irritated eyes.).     clindamycin -benzoyl peroxide (BENZACLIN) gel APPLY TOPICALLY TWICE DAILY TO AFFECTED AREA ON FACE, TRUNK 50 g 0   gabapentin  (NEURONTIN ) 300 MG capsule Take 300 mg by mouth 3 (three) times daily as needed.     HYDROcodone -acetaminophen  (NORCO/VICODIN) 5-325 MG tablet Take 1 tablet by mouth every 4 (four) hours as needed. 15 tablet 0   losartan  (COZAAR ) 25 MG tablet Take 1 tablet (25 mg total) by mouth daily. 90 tablet 3   meloxicam  (MOBIC ) 15 MG tablet Take 15 mg by mouth at bedtime as needed for pain.     Omeprazole Magnesium (PRILOSEC PO) Take by mouth. Otc - takes as needed     ondansetron  (ZOFRAN ) 8 MG tablet Take 1 tablet (8 mg total) by mouth every 8 (eight) hours as needed for nausea or vomiting. (Patient not taking: Reported on 10/29/2023) 20 tablet 0   pantoprazole  (PROTONIX ) 20 MG tablet Take 1 tablet (20 mg total) by mouth daily. (Patient not taking: Reported on 10/29/2023) 90 tablet 0   silodosin  (RAPAFLO ) 8 MG CAPS capsule Take 1 capsule (8 mg total) by mouth daily with breakfast. (Patient not taking: Reported on 10/29/2023) 30 capsule 2   No current facility-administered medications for this visit.    Allergies as of 10/29/2023 - Review Complete 10/29/2023  Allergen Reaction  Noted   Salicylates  03/22/2019    Family History  Problem Relation Age of Onset   Throat cancer Mother        smoker   COPD Sister    Cancer Brother        unknown   Arthritis Sister    Diabetes Sister    Diabetes Sister    Stroke Brother 6       tobacco use    Social History   Socioeconomic History   Marital status: Married    Spouse name: Deadra Everts   Number of children: 3   Years of education: Associates degree   Highest education level: Associate degree: occupational, Scientist, product/process development, or vocational program  Occupational History   Not on file  Tobacco Use   Smoking status: Former    Current packs/day: 0.00    Types: Cigarettes    Quit date: 07/11/1989    Years since quitting: 34.3    Passive exposure: Past  Smokeless tobacco: Never  Vaping Use   Vaping status: Never Used  Substance and Sexual Activity   Alcohol use: Not Currently    Comment: 1 drink 1-2 time a month   Drug use: Never   Sexual activity: Not Currently  Other Topics Concern   Not on file  Social History Narrative   07/12/19   From: moved from [place]  - but from [place] originally    Living: with wife Deadra Everts - 38 years   Work: retired from [employer], Psychiatrist      Family: 2 children - Marissa, Medical illustrator and [place] - live nearby [place], [place]) - grandkids - 8 - and 2 great grandchildren    5 step children      Enjoys: working on cars, walking, helping and supporting family      Exercise: walking - regularly, hard in the winter   Diet: better now      Safety   Seat belts: Yes    Guns: No   Safe in relationships: Yes    Social Drivers of Corporate investment banker Strain: Low Risk  (09/29/2023)   Overall Financial Resource Strain (CARDIA)    Difficulty of Paying Living Expenses: Not hard at all  Food Insecurity: No Food Insecurity (09/29/2023)   Hunger Vital Sign    Worried About Running Out of Food in the Last Year: Never true    Ran Out of Food in the Last Year: Never true   Transportation Needs: No Transportation Needs (09/29/2023)   PRAPARE - Administrator, Civil Service (Medical): No    Lack of Transportation (Non-Medical): No  Physical Activity: Insufficiently Active (09/29/2023)   Exercise Vital Sign    Days of Exercise per Week: 3 days    Minutes of Exercise per Session: 20 min  Stress: No Stress Concern Present (09/29/2023)   [employer] of Occupational Health - Occupational Stress Questionnaire    Feeling of Stress : Not at all  Social Connections: Socially Integrated (09/29/2023)   Social Connection and Isolation Panel [NHANES]    Frequency of Communication with Friends and Family: More than three times a week    Frequency of Social Gatherings with Friends and Family: Twice a week    Attends Religious Services: More than 4 times per year    Active Member of [employer] or Organizations: Yes    Attends Engineer, structural: More than 4 times per year    Marital Status: Married  Catering manager Violence: Not on file     Review of Systems   Gen: Denies any fever, chills, fatigue, weight loss, lack of appetite.  CV: Denies chest pain, heart palpitations, peripheral edema, syncope.  Resp: Denies shortness of breath at rest or with exertion. Denies wheezing or cough.  GI: Denies dysphagia or odynophagia. Denies jaundice, hematemesis, fecal incontinence. GU : Denies urinary burning, urinary frequency, urinary hesitancy MS: Denies joint pain, muscle weakness, cramps, or limitation of movement.  Derm: Denies rash, itching, dry skin Psych: Denies depression, anxiety, memory loss, and confusion Heme: Denies bruising, bleeding, and enlarged lymph nodes.   Physical Exam   BP 133/82   Pulse 68   Temp 97.8 F (36.6 C)   Ht 5\' 7"  (1.702 m)   Wt 209 lb (94.8 kg)   BMI 32.73 kg/m  General:   Alert and oriented. Pleasant and cooperative. Well-nourished and well-developed.  Head:  Normocephalic and atraumatic. Eyes:  Without  icterus Ears:  Normal auditory acuity. Lungs:  Clear to auscultation bilaterally.  Heart:  S1, S2 present without murmurs appreciated.  Abdomen:  +BS, soft, non-tender and non-distended. No HSM noted. No guarding or rebound. No masses appreciated.  Rectal:  Deferred  Msk:  Symmetrical without gross deformities. Normal posture. Extremities:  Without edema. Neurologic:  Alert and  oriented x4;  grossly normal neurologically. Skin:  Intact without significant lesions or rashes. Psych:  Alert and cooperative. Normal mood and affect.   Assessment   Alex Castaneda is a very pleasant 68 y.o. male presenting today at the request of Bedsole, Amy E, MD due to abdominal/pelvic pain onset about 6 weeks ago, nausea, and GERD. Medical history significant for BPH and urinary obstruction s/p TUIP ( transurethral incision of prostate) in March 2025, GERD, HLD, HTN, OA, thyroid  disease, hepatic steatosis, concern for evolving ID noted in Dec 2024, colonoscopy in 2022 with hyperplastic polyp. No known FH colon cancer or polyps.  Lower abdominal pain/suprapubic pain: unclear etiology at this time and without any associating postprandially or r/t bowel movements. No diarrhea noted; he has good results with Miralax prn for bowel movements. No overt GI bleeding. CT with contrast unrevealing as noted above. Last colonoscopy 2022 with hyperplastic polyp. Query if this is Urological in nature; unable to exclude inflammatory prostatitis s/p TUIP several months ago and onset seems to coincide with this. As no improvement with BMs, doubt underlying constipation contributing but unable to rule this out.  GERD: rarely needing PPI and takes omeprazole prn. No dysphagia. Remote EGD in past in [place] . No procedure notes available.   Nausea: associated with abdominal pain onset. He has had improvement in this over the past week. Nausea at times worsened after eating and sometimes not associated. Suspect this is r/t underlying  pain but with concern for evolving IDA, will pursue further and check celiac serologies, arrange EGD. Doubt delayed gastric emptying.  Concern for evolving IDA: review of labs with low iron 41, ferritin 23 in September. No overt GI bleeding. Hgb mildly low at 12.6 in Sept 2024 and improved to 13.18 May 2023. Will update iron studies now. Due to new onset IDA, will pursue colonoscopy/EGD to be thorough. Celiac serologies ordered.     PLAN   Update iron studies, check celiac panel  Proceed with colonoscopy/EGD by Dr. Alita Irwin in near future: the risks, benefits, and alternatives have been discussed with the patient in detail. The patient states understanding and desires to proceed.   Continue PPI prn for now  If worsening suprapubic abdominal pain, recommend return to Urology  Continue to avoid constipation, take Miralax as needed  Return in 2 months regardless   Delman Ferns, PhD, ANP-BC [facility]    ADDENDUM: celiac panel negative. Ferritin low at 18, iron 72. IDA. Last Hgb 13.6 in April 2025. Although Hgb "normal", with dropping ferritin, Hgb will follow over time. Colonoscopy/EGD as planned.  Delman Ferns, PhD, ANP-BC [facility]

## 2023-10-29 NOTE — Progress Notes (Signed)
 Gastroenterology Office Note    Referring Provider: Judithann Novas, MD Primary Care Physician:  Judithann Novas, MD  Primary GI: Dr. Alita Irwin , previously Dr. Tully Gainer ([facility])   Chief Complaint   Chief Complaint  Patient presents with   Abdominal Pain    Having low abdominal/pelvic pain. Started about 6 weeks ago. Pain is like a ache and some pressure.  States it radiates up into stomach. Taking hydrocodone  for the pain. Went to ED on 4/18 and 4/21 for pain and nausea.    Gastroesophageal Reflux    States pantoprazole  did not work and taking otc prilosec as needed.     History of Present Illness   Alex Castaneda is a 68 y.o. male presenting today at the request of Bedsole, Amy E, MD due to abdominal/pelvic pain onset about 6 weeks ago, nausea, and GERD. Medical history significant for BPH and urinary obstruction s/p TUIP ( transurethral incision of prostate) in March 2025, GERD, HLD, HTN, OA, thyroid  disease, hepatic steatosis, evolving IDA as noted in below labs, presenting for further GI evaluation. Prior colonoscopy in 2022 with hyperplastic polyp. No known FH colon cancer or polyps.  Patient has seen Urology due to pelvic pain (10/20/23) and did not feel that pelvic pain was r/t prior BPH with transurethral incision of prostate in March 2025. Urine clean at that time and without any flow issues. Urology had low suspicion for infection but unable to r/o prostatitis. Empiric Rapaflo  was started.   About 6 weeks ago became very nauseated while coming home from an appt and went to urgent care. CT head without acute abnormality. Zofran  mildly helped. He then went again to urgent care several days later due to suprapubic pain. CT abd/pelvis with contrast without acute abnormality. Small fat-containing left inguinal hernia. Hepatic steatosis.  Constant lower pelvic pain that waxes and wanes in intensity, hydrocodone  if severe, tylenol  without help. Nausea intermittently. Has noticed  if increased nausea, less pain, and if increased pain, less nausea. Last few days lower pain radiating up both side s of abdomen. No pain after eating.    Noted fatigue initially but better now. Zofran  mild help from PCP. Pantoprazole  not much improvement with nausea.  Previously had taken Prilosec prn, will go weeks without it and not need. No dysphagia. Worsened GERD if ketchup. Good appetite now but no appetite when suprapubic pain started.BM every few days. Takes Miralax if no BM in a few days. Pain not related to BMs. No improvement after BMs. Had loss of appetite during these 6 weeks but now improving. No diarrhea. Nausea has started to slowly improve over past week.  No rectal bleeding, no melena. Avoids NSAIDs other than meloxicam  that is rare.    Dec 2024: Low iron 41, ferritin 23, started taking iron supplements. Hgb 13/14 range.   FH: no colon polyps No colon cancer   [facility] July 2022: One 6 mm polyp in the sigmoid colon, removed with a cold snare. Resected and retrieved. - The examination was otherwise normal. - The rectum, sigmoid colon, descending colon, transverse colon, ascending colon and cecum are normal. - Non- bleeding internal hemorrhoids. Hyperplastic polyp.   EGD many years ago in [place] .   Past Medical History:  Diagnosis Date   Abnormal LFTs 05/15/2022   Anemia 09/30/2023   Arthritis    Benign prostatic hyperplasia with urinary hesitancy 03/05/2023   Centrilobular emphysema (HCC) 09/30/2023   Colon polyps    GERD (gastroesophageal reflux disease)  Hyperlipidemia    Hypertension    Osteoarthritis 07/13/2019   Spinal stenosis of lumbar region 07/20/2019   Thyroid  disease    Trigeminal neuralgia 07/19/2019    Past Surgical History:  Procedure Laterality Date   COLONOSCOPY WITH PROPOFOL  N/A 01/01/2021   Procedure: COLONOSCOPY WITH PROPOFOL ;  Surgeon: Irby Mannan, MD;  Location: [facility] ENDOSCOPY;  Service: Endoscopy;  Laterality: N/A;   ELBOW  SURGERY Right    EYE SURGERY  2000   HERNIA REPAIR     Umbilical   KNEE SURGERY Left    arthroscopic   ROTATOR CUFF REPAIR Right    TRANSURETHRAL INCISION OF PROSTATE N/A 08/22/2023   Procedure: INCISION, PROSTATE, TRANSURETHRAL;  Surgeon: Lawerence Pressman, MD;  Location: [facility] ORS;  Service: Urology;  Laterality: N/A;    Current Outpatient Medications  Medication Sig Dispense Refill   acetaminophen  (TYLENOL ) 500 MG tablet Take 500-1,000 mg by mouth every 6 (six) hours as needed (pain.).     amLODipine  (NORVASC ) 5 MG tablet TAKE 1 TABLET(5 MG) BY MOUTH DAILY 90 tablet 3   carboxymethylcellulose (REFRESH PLUS) 0.5 % SOLN Place 1 drop into both eyes 3 (three) times daily as needed (dry/irritated eyes.).     clindamycin -benzoyl peroxide (BENZACLIN) gel APPLY TOPICALLY TWICE DAILY TO AFFECTED AREA ON FACE, TRUNK 50 g 0   gabapentin  (NEURONTIN ) 300 MG capsule Take 300 mg by mouth 3 (three) times daily as needed.     HYDROcodone -acetaminophen  (NORCO/VICODIN) 5-325 MG tablet Take 1 tablet by mouth every 4 (four) hours as needed. 15 tablet 0   losartan  (COZAAR ) 25 MG tablet Take 1 tablet (25 mg total) by mouth daily. 90 tablet 3   meloxicam  (MOBIC ) 15 MG tablet Take 15 mg by mouth at bedtime as needed for pain.     Omeprazole Magnesium (PRILOSEC PO) Take by mouth. Otc - takes as needed     ondansetron  (ZOFRAN ) 8 MG tablet Take 1 tablet (8 mg total) by mouth every 8 (eight) hours as needed for nausea or vomiting. (Patient not taking: Reported on 10/29/2023) 20 tablet 0   pantoprazole  (PROTONIX ) 20 MG tablet Take 1 tablet (20 mg total) by mouth daily. (Patient not taking: Reported on 10/29/2023) 90 tablet 0   silodosin  (RAPAFLO ) 8 MG CAPS capsule Take 1 capsule (8 mg total) by mouth daily with breakfast. (Patient not taking: Reported on 10/29/2023) 30 capsule 2   No current facility-administered medications for this visit.    Allergies as of 10/29/2023 - Review Complete 10/29/2023  Allergen Reaction  Noted   Salicylates  03/22/2019    Family History  Problem Relation Age of Onset   Throat cancer Mother        smoker   COPD Sister    Cancer Brother        unknown   Arthritis Sister    Diabetes Sister    Diabetes Sister    Stroke Brother 6       tobacco use    Social History   Socioeconomic History   Marital status: Married    Spouse name: Deadra Everts   Number of children: 3   Years of education: Associates degree   Highest education level: Associate degree: occupational, Scientist, product/process development, or vocational program  Occupational History   Not on file  Tobacco Use   Smoking status: Former    Current packs/day: 0.00    Types: Cigarettes    Quit date: 07/11/1989    Years since quitting: 34.3    Passive exposure: Past  Smokeless tobacco: Never  Vaping Use   Vaping status: Never Used  Substance and Sexual Activity   Alcohol use: Not Currently    Comment: 1 drink 1-2 time a month   Drug use: Never   Sexual activity: Not Currently  Other Topics Concern   Not on file  Social History Narrative   07/12/19   From: moved from [place]  - but from [place] originally    Living: with wife Deadra Everts - 38 years   Work: retired from [employer], Psychiatrist      Family: 2 children - Marissa, Medical illustrator and [place] - live nearby [place], [place]) - grandkids - 8 - and 2 great grandchildren    5 step children      Enjoys: working on cars, walking, helping and supporting family      Exercise: walking - regularly, hard in the winter   Diet: better now      Safety   Seat belts: Yes    Guns: No   Safe in relationships: Yes    Social Drivers of Corporate investment banker Strain: Low Risk  (09/29/2023)   Overall Financial Resource Strain (CARDIA)    Difficulty of Paying Living Expenses: Not hard at all  Food Insecurity: No Food Insecurity (09/29/2023)   Hunger Vital Sign    Worried About Running Out of Food in the Last Year: Never true    Ran Out of Food in the Last Year: Never true   Transportation Needs: No Transportation Needs (09/29/2023)   PRAPARE - Administrator, Civil Service (Medical): No    Lack of Transportation (Non-Medical): No  Physical Activity: Insufficiently Active (09/29/2023)   Exercise Vital Sign    Days of Exercise per Week: 3 days    Minutes of Exercise per Session: 20 min  Stress: No Stress Concern Present (09/29/2023)   [employer] of Occupational Health - Occupational Stress Questionnaire    Feeling of Stress : Not at all  Social Connections: Socially Integrated (09/29/2023)   Social Connection and Isolation Panel [NHANES]    Frequency of Communication with Friends and Family: More than three times a week    Frequency of Social Gatherings with Friends and Family: Twice a week    Attends Religious Services: More than 4 times per year    Active Member of [employer] or Organizations: Yes    Attends Engineer, structural: More than 4 times per year    Marital Status: Married  Catering manager Violence: Not on file     Review of Systems   Gen: Denies any fever, chills, fatigue, weight loss, lack of appetite.  CV: Denies chest pain, heart palpitations, peripheral edema, syncope.  Resp: Denies shortness of breath at rest or with exertion. Denies wheezing or cough.  GI: Denies dysphagia or odynophagia. Denies jaundice, hematemesis, fecal incontinence. GU : Denies urinary burning, urinary frequency, urinary hesitancy MS: Denies joint pain, muscle weakness, cramps, or limitation of movement.  Derm: Denies rash, itching, dry skin Psych: Denies depression, anxiety, memory loss, and confusion Heme: Denies bruising, bleeding, and enlarged lymph nodes.   Physical Exam   BP 133/82   Pulse 68   Temp 97.8 F (36.6 C)   Ht 5\' 7"  (1.702 m)   Wt 209 lb (94.8 kg)   BMI 32.73 kg/m  General:   Alert and oriented. Pleasant and cooperative. Well-nourished and well-developed.  Head:  Normocephalic and atraumatic. Eyes:  Without  icterus Ears:  Normal auditory acuity. Lungs:  Clear to auscultation bilaterally.  Heart:  S1, S2 present without murmurs appreciated.  Abdomen:  +BS, soft, non-tender and non-distended. No HSM noted. No guarding or rebound. No masses appreciated.  Rectal:  Deferred  Msk:  Symmetrical without gross deformities. Normal posture. Extremities:  Without edema. Neurologic:  Alert and  oriented x4;  grossly normal neurologically. Skin:  Intact without significant lesions or rashes. Psych:  Alert and cooperative. Normal mood and affect.   Assessment   Alex Castaneda is a very pleasant 68 y.o. male presenting today at the request of Bedsole, Amy E, MD due to abdominal/pelvic pain onset about 6 weeks ago, nausea, and GERD. Medical history significant for BPH and urinary obstruction s/p TUIP ( transurethral incision of prostate) in March 2025, GERD, HLD, HTN, OA, thyroid  disease, hepatic steatosis, concern for evolving ID noted in Dec 2024, colonoscopy in 2022 with hyperplastic polyp. No known FH colon cancer or polyps.  Lower abdominal pain/suprapubic pain: unclear etiology at this time and without any associating postprandially or r/t bowel movements. No diarrhea noted; he has good results with Miralax prn for bowel movements. No overt GI bleeding. CT with contrast unrevealing as noted above. Last colonoscopy 2022 with hyperplastic polyp. Query if this is Urological in nature; unable to exclude inflammatory prostatitis s/p TUIP several months ago and onset seems to coincide with this. As no improvement with BMs, doubt underlying constipation contributing but unable to rule this out.  GERD: rarely needing PPI and takes omeprazole prn. No dysphagia. Remote EGD in past in [place] . No procedure notes available.   Nausea: associated with abdominal pain onset. He has had improvement in this over the past week. Nausea at times worsened after eating and sometimes not associated. Suspect this is r/t underlying  pain but with concern for evolving IDA, will pursue further and check celiac serologies, arrange EGD. Doubt delayed gastric emptying.  Concern for evolving IDA: review of labs with low iron 41, ferritin 23 in September. No overt GI bleeding. Hgb mildly low at 12.6 in Sept 2024 and improved to 13.18 May 2023. Will update iron studies now. Due to new onset IDA, will pursue colonoscopy/EGD to be thorough. Celiac serologies ordered.     PLAN   Update iron studies, check celiac panel  Proceed with colonoscopy/EGD by Dr. Alita Irwin in near future: the risks, benefits, and alternatives have been discussed with the patient in detail. The patient states understanding and desires to proceed.   Continue PPI prn for now  If worsening suprapubic abdominal pain, recommend return to Urology  Continue to avoid constipation, take Miralax as needed  Return in 2 months regardless   Delman Ferns, PhD, ANP-BC [facility]    ADDENDUM: celiac panel negative. Ferritin low at 18, iron 72. IDA. Last Hgb 13.6 in April 2025. Although Hgb "normal", with dropping ferritin, Hgb will follow over time. Colonoscopy/EGD as planned.  Delman Ferns, PhD, ANP-BC [facility]

## 2023-10-30 LAB — CELIAC DISEASE PANEL
(tTG) Ab, IgA: 1.1 U/mL
(tTG) Ab, IgG: <1 U/mL
Gliadin IgA: 1.1 U/mL
Gliadin IgG: <1 U/mL
Immunoglobulin A: 123 mg/dL (ref 70–320)

## 2023-10-30 LAB — IRON,TIBC AND FERRITIN PANEL
%SAT: 17 % — ABNORMAL LOW (ref 20–48)
Ferritin: 18 ng/mL — ABNORMAL LOW (ref 24–380)
Iron: 72 ug/dL (ref 50–180)
TIBC: 424 ug/dL (ref 250–425)

## 2023-11-04 ENCOUNTER — Encounter (INDEPENDENT_AMBULATORY_CARE_PROVIDER_SITE_OTHER): Payer: Self-pay

## 2023-11-06 ENCOUNTER — Ambulatory Visit: Payer: Self-pay | Admitting: Gastroenterology

## 2023-11-07 NOTE — Telephone Encounter (Signed)
 Appt scheduled. Pt aware

## 2023-11-11 ENCOUNTER — Other Ambulatory Visit (HOSPITAL_COMMUNITY)
Admission: RE | Admit: 2023-11-11 | Discharge: 2023-11-11 | Disposition: A | Discharge status: Home / Self Care | Source: Ambulatory Visit | Attending: Gastroenterology | Admitting: Gastroenterology

## 2023-11-11 DIAGNOSIS — K760 Fatty (change of) liver, not elsewhere classified: Secondary | ICD-10-CM | POA: Insufficient documentation

## 2023-11-11 DIAGNOSIS — D509 Iron deficiency anemia, unspecified: Secondary | ICD-10-CM | POA: Insufficient documentation

## 2023-11-11 LAB — BASIC METABOLIC PANEL WITH GFR
Anion gap: 9 (ref 5–15)
BUN: 8 mg/dL (ref 8–23)
CO2: 25 mmol/L (ref 22–32)
Calcium: 9.5 mg/dL (ref 8.9–10.3)
Chloride: 106 mmol/L (ref 98–111)
Creatinine, Ser: 1.03 mg/dL (ref 0.61–1.24)
GFR, Estimated: >60 mL/min (ref >60)
Glucose, Bld: 102 mg/dL — ABNORMAL HIGH (ref 70–99)
Potassium: 4.1 mmol/L (ref 3.5–5.1)
Sodium: 140 mmol/L (ref 135–145)

## 2023-11-17 ENCOUNTER — Encounter (HOSPITAL_COMMUNITY): Payer: Self-pay | Admitting: Gastroenterology

## 2023-11-17 ENCOUNTER — Other Ambulatory Visit: Payer: Self-pay

## 2023-11-17 ENCOUNTER — Encounter (HOSPITAL_COMMUNITY)
Admission: RE | Disposition: A | Discharge status: Home / Self Care | Payer: Self-pay | Source: Home / Self Care | Attending: Gastroenterology

## 2023-11-17 ENCOUNTER — Ambulatory Visit (HOSPITAL_COMMUNITY): Admitting: Anesthesiology

## 2023-11-17 ENCOUNTER — Ambulatory Visit (HOSPITAL_COMMUNITY)
Admission: RE | Admit: 2023-11-17 | Discharge: 2023-11-17 | Disposition: A | Discharge status: Home / Self Care | Attending: Gastroenterology | Admitting: Gastroenterology

## 2023-11-17 DIAGNOSIS — J449 Chronic obstructive pulmonary disease, unspecified: Secondary | ICD-10-CM | POA: Diagnosis not present

## 2023-11-17 DIAGNOSIS — K219 Gastro-esophageal reflux disease without esophagitis: Secondary | ICD-10-CM | POA: Insufficient documentation

## 2023-11-17 DIAGNOSIS — K648 Other hemorrhoids: Secondary | ICD-10-CM | POA: Insufficient documentation

## 2023-11-17 DIAGNOSIS — I10 Essential (primary) hypertension: Secondary | ICD-10-CM

## 2023-11-17 DIAGNOSIS — Z79899 Other long term (current) drug therapy: Secondary | ICD-10-CM | POA: Insufficient documentation

## 2023-11-17 DIAGNOSIS — K635 Polyp of colon: Secondary | ICD-10-CM

## 2023-11-17 DIAGNOSIS — K449 Diaphragmatic hernia without obstruction or gangrene: Secondary | ICD-10-CM | POA: Diagnosis not present

## 2023-11-17 DIAGNOSIS — M1990 Unspecified osteoarthritis, unspecified site: Secondary | ICD-10-CM | POA: Diagnosis not present

## 2023-11-17 DIAGNOSIS — K4090 Unilateral inguinal hernia, without obstruction or gangrene, not specified as recurrent: Secondary | ICD-10-CM | POA: Diagnosis not present

## 2023-11-17 DIAGNOSIS — D120 Benign neoplasm of cecum: Secondary | ICD-10-CM | POA: Insufficient documentation

## 2023-11-17 DIAGNOSIS — K2960 Other gastritis without bleeding: Secondary | ICD-10-CM | POA: Diagnosis not present

## 2023-11-17 DIAGNOSIS — K3189 Other diseases of stomach and duodenum: Secondary | ICD-10-CM | POA: Insufficient documentation

## 2023-11-17 DIAGNOSIS — N400 Enlarged prostate without lower urinary tract symptoms: Secondary | ICD-10-CM | POA: Insufficient documentation

## 2023-11-17 DIAGNOSIS — D509 Iron deficiency anemia, unspecified: Secondary | ICD-10-CM | POA: Insufficient documentation

## 2023-11-17 DIAGNOSIS — K760 Fatty (change of) liver, not elsewhere classified: Secondary | ICD-10-CM | POA: Insufficient documentation

## 2023-11-17 DIAGNOSIS — E785 Hyperlipidemia, unspecified: Secondary | ICD-10-CM | POA: Insufficient documentation

## 2023-11-17 DIAGNOSIS — K644 Residual hemorrhoidal skin tags: Secondary | ICD-10-CM | POA: Insufficient documentation

## 2023-11-17 DIAGNOSIS — K649 Unspecified hemorrhoids: Secondary | ICD-10-CM | POA: Diagnosis not present

## 2023-11-17 DIAGNOSIS — I8500 Esophageal varices without bleeding: Secondary | ICD-10-CM | POA: Diagnosis not present

## 2023-11-17 DIAGNOSIS — K2970 Gastritis, unspecified, without bleeding: Secondary | ICD-10-CM

## 2023-11-17 DIAGNOSIS — K2990 Gastroduodenitis, unspecified, without bleeding: Secondary | ICD-10-CM

## 2023-11-17 DIAGNOSIS — Z87891 Personal history of nicotine dependence: Secondary | ICD-10-CM | POA: Insufficient documentation

## 2023-11-17 SURGERY — COLONOSCOPY
Anesthesia: General

## 2023-11-17 MED ORDER — PHENYLEPHRINE 80 MCG/ML (10ML) SYRINGE FOR IV PUSH (FOR BLOOD PRESSURE SUPPORT)
PREFILLED_SYRINGE | INTRAVENOUS | Status: DC | PRN
  Administered 2023-11-17: 120 ug via INTRAVENOUS

## 2023-11-17 MED ORDER — LACTATED RINGERS IV SOLN: INTRAVENOUS | Status: DC | PRN

## 2023-11-17 MED ORDER — PROPOFOL 500 MG/50ML IV EMUL
INTRAVENOUS | Status: DC | PRN
  Administered 2023-11-17: 150 ug/kg/min via INTRAVENOUS

## 2023-11-17 MED ORDER — LIDOCAINE 2% (20 MG/ML) 5 ML SYRINGE
INTRAMUSCULAR | Status: DC | PRN
Start: 2023-11-17 — End: 2023-11-17
  Administered 2023-11-17: 50 mg via INTRAVENOUS

## 2023-11-17 MED ORDER — PROPOFOL 10 MG/ML IV BOLUS
INTRAVENOUS | Status: DC | PRN
  Administered 2023-11-17: 5 mg via INTRAVENOUS
  Administered 2023-11-17: 100 mg via INTRAVENOUS
  Administered 2023-11-17: 20 mg via INTRAVENOUS

## 2023-11-17 MED ORDER — LACTATED RINGERS IV SOLN: INTRAVENOUS | Status: DC

## 2023-11-17 NOTE — Interval H&P Note (Signed)
 History and Physical Interval Note:  11/17/2023 1:13 PM  CAMDEN KNOTEK  has presented today for surgery, with the diagnosis of IRON DEFICIENCY ANEMIA.  The various methods of treatment have been discussed with the patient and family. After consideration of risks, benefits and other options for treatment, the patient has consented to  Procedure(s) with comments: COLONOSCOPY (N/A) - 1:30PM;ASA 2 EGD (ESOPHAGOGASTRODUODENOSCOPY) (N/A) - 1:30PM;ASA 2 DILATION, ESOPHAGUS (N/A) - 1:30PM;ASA 2 as a surgical intervention.  The patient's history has been reviewed, patient examined, no change in status, stable for surgery.  I have reviewed the patient's chart and labs.  Questions were answered to the patient's satisfaction.     Forbes Ida Rochel Privett

## 2023-11-17 NOTE — Op Note (Signed)
 [facility] Patient Name: Alex Castaneda Procedure Date: 11/17/2023 1:22 PM MRN: [id number] Date of Birth: 09/13/1955 Attending MD: Terril Fetters , MD, [id number] CSN: [id number] Age: 68 Admit Type: Outpatient Procedure:                Colonoscopy Indications:              Unexplained iron deficiency anemia Providers:                Terril Fetters, MD, Willena Harp, Annell Barrow Referring MD:              Medicines:                Monitored Anesthesia Care Complications:            No immediate complications. Estimated Blood Loss:     Estimated blood loss: none. Procedure:                Pre-Anesthesia Assessment:                           - Prior to the procedure, a History and Physical                            was performed, and patient medications and                            allergies were reviewed. The patient's tolerance of                            previous anesthesia was also reviewed. The risks                            and benefits of the procedure and the sedation                            options and risks were discussed with the patient.                            All questions were answered, and informed consent                            was obtained. Prior Anticoagulants: The patient has                            taken no anticoagulant or antiplatelet agents                            except for aspirin. ASA Grade Assessment: II - A                            patient with mild systemic disease. After reviewing                            the  risks and benefits, the patient was deemed in                            satisfactory condition to undergo the procedure.                           After obtaining informed consent, the colonoscope                            was passed under direct vision. Throughout the                            procedure, the patient's blood pressure, pulse, and                            oxygen  saturations were monitored continuously. The                            [id number]) scope was introduced through the                            anus and advanced to the the cecum, identified by                            appendiceal orifice and ileocecal valve. The                            colonoscopy was performed without difficulty. The                            patient tolerated the procedure well. The quality                            of the bowel preparation was evaluated using the                            BBPS [facility] Bowel Preparation Scale) with scores                            of: Right Colon = 3, Transverse Colon = 3 and Left                            Colon = 3 (entire mucosa seen well with no residual                            staining, small fragments of stool or opaque                            liquid). The total BBPS score equals 9. The                            ileocecal valve, appendiceal orifice, and rectum  were photographed. Scope In: 1:49:05 PM Scope Out: 2:06:54 PM Scope Withdrawal Time: 0 hours 15 minutes 55 seconds  Total Procedure Duration: 0 hours 17 minutes 49 seconds  Findings:      The perianal and digital rectal examinations were normal.      A 3 mm polyp was found in the cecum. The polyp was sessile. The polyp       was removed with a cold snare. Resection was complete, but the polyp       tissue was not retrieved.      Non-bleeding external and internal hemorrhoids were found during       retroflexion. The hemorrhoids were small. Impression:               - One 3 mm polyp in the cecum, removed with a cold                            snare. Complete resection. Polyp tissue not                            retrieved.                           - Non-bleeding external and internal hemorrhoids. Moderate Sedation:      Per Anesthesia Care Recommendation:           - Patient has a contact number available for                             emergencies. The signs and symptoms of potential                            delayed complications were discussed with the                            patient. Return to normal activities tomorrow.                            Written discharge instructions were provided to the                            patient.                           - Resume previous diet.                           - Continue present medications.                           - Repeat colonoscopy in 10 years for screening                            purposes.                           - Return to GI office as previously scheduled.                           -  If upper endoscopy biopsies are negative, patient                            iron levels do not improve adeqautely with                            supplementation , mayu pursue capsule endoscopy in                            future Procedure Code(s):        --- Professional ---                           [phone], Colonoscopy, flexible; with removal of                            tumor(s), polyp(s), or other lesion(s) by snare                            technique Diagnosis Code(s):        --- Professional ---                           D12.0, Benign neoplasm of cecum                           K64.8, Other hemorrhoids                           D50.9, Iron deficiency anemia, unspecified CPT copyright 2022 American Medical Association. All rights reserved. The codes documented in this report are preliminary and upon coder review may  be revised to meet current compliance requirements. Terril Fetters, MD Terril Fetters, MD 11/17/2023 2:13:41 PM This report has been signed electronically. Number of Addenda: 0

## 2023-11-17 NOTE — Discharge Instructions (Signed)

## 2023-11-17 NOTE — Transfer of Care (Signed)
 Immediate Anesthesia Transfer of Care Note  Patient: RASHID WHITENIGHT  Procedure(s) Performed: COLONOSCOPY EGD (ESOPHAGOGASTRODUODENOSCOPY) DILATION, ESOPHAGUS  Patient Location: PACU and Endoscopy Unit  Anesthesia Type:General  Level of Consciousness: awake  Airway & Oxygen Therapy: Patient Spontanous Breathing  Post-op Assessment: Report given to RN  Post vital signs: Reviewed and stable  Last Vitals:  Vitals Value Taken Time  BP 91/51 11/17/23 1414  Temp 36.4 C 11/17/23 1414  Pulse 97 11/17/23 1414  Resp 20 11/17/23 1414  SpO2 95 % 11/17/23 1414    Last Pain:  Vitals:   11/17/23 1414  TempSrc: Oral  PainSc: 0-No pain         Complications: No notable events documented.

## 2023-11-17 NOTE — Anesthesia Preprocedure Evaluation (Signed)
 Anesthesia Evaluation  Patient identified by MRN, date of birth, ID band Patient awake    Reviewed: Allergy & Precautions, H&P , NPO status , Patient's Chart, lab work & pertinent test results, reviewed documented beta blocker date and time   Airway Mallampati: II  TM Distance: >3 FB Neck ROM: full    Dental no notable dental hx.    Pulmonary COPD, former smoker   Pulmonary exam normal breath sounds clear to auscultation       Cardiovascular Exercise Tolerance: Good hypertension,  Rhythm:regular Rate:Normal     Neuro/Psych  Neuromuscular disease  negative psych ROS   GI/Hepatic Neg liver ROS,GERD  ,,  Endo/Other  negative endocrine ROS    Renal/GU negative Renal ROS  negative genitourinary   Musculoskeletal   Abdominal   Peds  Hematology  (+) Blood dyscrasia, anemia   Anesthesia Other Findings   Reproductive/Obstetrics negative OB ROS                             Anesthesia Physical Anesthesia Plan  ASA: 3  Anesthesia Plan: General   Post-op Pain Management:    Induction:   PONV Risk Score and Plan: Propofol  infusion  Airway Management Planned:   Additional Equipment:   Intra-op Plan:   Post-operative Plan:   Informed Consent: I have reviewed the patients History and Physical, chart, labs and discussed the procedure including the risks, benefits and alternatives for the proposed anesthesia with the patient or authorized representative who has indicated his/her understanding and acceptance.     Dental Advisory Given  Plan Discussed with: CRNA  Anesthesia Plan Comments:        Anesthesia Quick Evaluation

## 2023-11-17 NOTE — Op Note (Signed)
 [facility] Patient Name: Alex Castaneda Procedure Date: 11/17/2023 1:23 PM MRN: [id number] Date of Birth: May 14, 1956 Attending MD: Terril Fetters , MD, [id number] CSN: [id number] Age: 68 Admit Type: Outpatient Procedure:                Upper GI endoscopy Indications:              Unexplained iron deficiency anemia Providers:                Terril Fetters, MD, Willena Harp, Annell Barrow Referring MD:              Medicines:                Monitored Anesthesia Care Complications:            No immediate complications. Estimated Blood Loss:     Estimated blood loss: none. Procedure:                Pre-Anesthesia Assessment:                           - Prior to the procedure, a History and Physical                            was performed, and patient medications and                            allergies were reviewed. The patient's tolerance of                            previous anesthesia was also reviewed. The risks                            and benefits of the procedure and the sedation                            options and risks were discussed with the patient.                            All questions were answered, and informed consent                            was obtained. Prior Anticoagulants: The patient has                            taken no anticoagulant or antiplatelet agents. ASA                            Grade Assessment: II - A patient with mild systemic                            disease. After reviewing the risks and benefits,  the patient was deemed in satisfactory condition to                            undergo the procedure.                           After obtaining informed consent, the endoscope was                            passed under direct vision. Throughout the                            procedure, the patient's blood pressure, pulse, and                            oxygen saturations were  monitored continuously. The                            [id number]-H190 ([id number]) scope was introduced through the                            mouth, and advanced to the second part of duodenum.                            The upper GI endoscopy was accomplished without                            difficulty. The patient tolerated the procedure                            well. Scope In: 1:34:37 PM Scope Out: 1:41:53 PM Total Procedure Duration: 0 hours 7 minutes 16 seconds  Findings:      Grade I varices were found in the lower third of the esophagus.      The gastroesophageal flap valve was visualized endoscopically and       classified as Hill Grade IV (no fold, wide open lumen, hiatal hernia       present).      A 3 cm hiatal hernia was present.      Mildly erythematous mucosa without bleeding was found in the gastric       antrum. Biopsies were taken with a cold forceps for histology.      The duodenal bulb and second portion of the duodenum were normal. Impression:               - Grade I esophageal varices.                           - Gastroesophageal flap valve classified as Hill                            Grade IV (no fold, wide open lumen, hiatal hernia                            present).                           -  3 cm hiatal hernia.                           - Erythematous mucosa in the antrum. Biopsied.                           - Normal duodenal bulb and second portion of the                            duodenum. Moderate Sedation:      Per Anesthesia Care Recommendation:           - Patient has a contact number available for                            emergencies. The signs and symptoms of potential                            delayed complications were discussed with the                            patient. Return to normal activities tomorrow.                            Written discharge instructions were provided to the                            patient.                            - Resume previous diet.                           - Continue present medications.                           - Await pathology results.                           - Repeat upper endoscopy in 3 years for                            surveillance; unless patient is started in NSBB                           - Return to GI office as previously scheduled.                           -Abdominal ultrasound with elastrography to assess                            degree of fibrosis Procedure Code(s):        --- Professional ---                           [phone], Esophagogastroduodenoscopy, flexible,  transoral; with biopsy, single or multiple Diagnosis Code(s):        --- Professional ---                           I85.00, Esophageal varices without bleeding                           K44.9, Diaphragmatic hernia without obstruction or                            gangrene                           K31.89, Other diseases of stomach and duodenum                           D50.9, Iron deficiency anemia, unspecified CPT copyright 2022 American Medical Association. All rights reserved. The codes documented in this report are preliminary and upon coder review may  be revised to meet current compliance requirements. Terril Fetters, MD Terril Fetters, MD 11/17/2023 1:47:38 PM This report has been signed electronically. Number of Addenda: 0

## 2023-11-18 ENCOUNTER — Telehealth (INDEPENDENT_AMBULATORY_CARE_PROVIDER_SITE_OTHER): Payer: Self-pay | Admitting: *Deleted

## 2023-11-18 ENCOUNTER — Encounter (HOSPITAL_COMMUNITY): Payer: Self-pay | Admitting: Gastroenterology

## 2023-11-18 NOTE — Telephone Encounter (Signed)
 Per op note patient need to be schd for US  w/ elastography

## 2023-11-18 NOTE — Telephone Encounter (Signed)
 Pt informed US  is scheduled for Wednesday 11/26/23, arrive at 10:15 am to check in, NPO 6-8 hours prior.

## 2023-11-19 LAB — SURGICAL PATHOLOGY

## 2023-11-20 ENCOUNTER — Ambulatory Visit (INDEPENDENT_AMBULATORY_CARE_PROVIDER_SITE_OTHER): Payer: Self-pay | Admitting: Gastroenterology

## 2023-11-20 NOTE — Anesthesia Postprocedure Evaluation (Signed)
 Anesthesia Post Note  Patient: Alex Castaneda  Procedure(s) Performed: COLONOSCOPY EGD (ESOPHAGOGASTRODUODENOSCOPY) DILATION, ESOPHAGUS  Patient location during evaluation: Phase II Anesthesia Type: General Level of consciousness: awake Pain management: pain level controlled Vital Signs Assessment: post-procedure vital signs reviewed and stable Respiratory status: spontaneous breathing and respiratory function stable Cardiovascular status: blood pressure returned to baseline and stable Postop Assessment: no headache and no apparent nausea or vomiting Anesthetic complications: no Comments: Late entry   No notable events documented.   Last Vitals:  Vitals:   11/17/23 1424 11/17/23 1429  BP: (!) 87/63 126/75  Pulse: 77 82  Resp: 16 20  Temp:    SpO2: 98% 99%    Last Pain:  Vitals:   11/17/23 1429  TempSrc:   PainSc: 0-No pain                 Coretha Dew

## 2023-11-21 ENCOUNTER — Ambulatory Visit
Admission: RE | Admit: 2023-11-21 | Discharge: 2023-11-21 | Disposition: A | Discharge status: Home / Self Care | Source: Ambulatory Visit | Attending: Family Medicine | Admitting: Family Medicine

## 2023-11-21 DIAGNOSIS — Z87891 Personal history of nicotine dependence: Secondary | ICD-10-CM

## 2023-11-25 ENCOUNTER — Ambulatory Visit (INDEPENDENT_AMBULATORY_CARE_PROVIDER_SITE_OTHER): Admitting: Urology

## 2023-11-25 VITALS — BP 131/94 | HR 70 | Ht 67.0 in | Wt 207.4 lb

## 2023-11-25 DIAGNOSIS — R102 Pelvic and perineal pain: Secondary | ICD-10-CM

## 2023-11-25 MED ORDER — CELECOXIB 200 MG PO CAPS
200.0000 mg | ORAL_CAPSULE | Freq: Two times a day (BID) | ORAL | 0 refills | Status: DC
Start: 2023-11-25 — End: 2024-01-30

## 2023-11-25 NOTE — Progress Notes (Signed)
 Cystoscopy Procedure Note:  Indication: Pelvic pain  68 year old male who underwent transurethral incision of the prostate(TUIP) on 08/22/2023 for obstructive urinary symptoms.  Procedure was uncomplicated, and his urinary symptoms have completely resolved.  Around 6 weeks later he developed intermittent lower abdominal pain and nausea.  Has had extensive workup with negative urinalysis, normal CT scan.  He was set up for cystoscopy with me today for further evaluation.  After informed consent and discussion of the procedure and its risks, Alex Castaneda was positioned and prepped in the standard fashion. Cystoscopy was performed with a flexible cystoscope. The urethra, bladder neck and entire bladder was visualized in a standard fashion. The prostate was open consistent with prior TUIP. The ureteral orifices were visualized in their normal location and orientation.  Bladder mucosa grossly normal throughout, no abnormalities on retroflexion   Findings: Normal cystoscopy, open prostate fossa after TUIP  Assessment and Plan: Reassurance provided for no urologic cause of his lower abdominal pain.  Urinary symptoms have resolved after procedure.  His symptoms started approximately 6 weeks after surgery which would indicate a different etiology.  I sent in a 10-day course of Celebrex to see if this improves some of his pelvic pain of unclear etiology, RTC 3 to 4 months PVR  Jay Meth, MD 11/25/2023

## 2023-11-25 NOTE — Patient Instructions (Signed)
 Alex Castaneda

## 2023-11-26 ENCOUNTER — Ambulatory Visit (HOSPITAL_COMMUNITY)
Admission: RE | Admit: 2023-11-26 | Discharge: 2023-11-26 | Disposition: A | Discharge status: Home / Self Care | Source: Ambulatory Visit | Attending: Gastroenterology | Admitting: Gastroenterology

## 2023-11-26 DIAGNOSIS — I8500 Esophageal varices without bleeding: Secondary | ICD-10-CM | POA: Diagnosis present

## 2023-11-27 ENCOUNTER — Ambulatory Visit: Admitting: Urology

## 2023-12-01 ENCOUNTER — Encounter: Payer: Self-pay | Admitting: Family Medicine

## 2023-12-01 NOTE — Telephone Encounter (Signed)
 Alex Castaneda/Alex Castaneda:  Patient needs appt with me. Can be urgent. I know I have limited availability as going out of town, but please put on cancellation list if needed. Would prefer this week or at most 2 weeks out.

## 2023-12-03 NOTE — Progress Notes (Signed)
 Elastrgraphy and Ultrasound   Fatty liver infiltration.  Mild splenic enlargement.   No gallstones or ductal dilatation.   There is limited evaluation of several structures with overlapping bowel gas and soft tissue. Please correlate with recent CT scan.   ULTRASOUND HEPATIC ELASTOGRAPHY:   Median kPa:  3.6

## 2023-12-03 NOTE — Telephone Encounter (Signed)
 Hi Ladonna, he uses mychart so might be easier to get him that way!

## 2023-12-08 ENCOUNTER — Other Ambulatory Visit: Payer: Self-pay | Admitting: Acute Care

## 2023-12-08 DIAGNOSIS — Z87891 Personal history of nicotine dependence: Secondary | ICD-10-CM

## 2023-12-08 DIAGNOSIS — Z122 Encounter for screening for malignant neoplasm of respiratory organs: Secondary | ICD-10-CM

## 2023-12-16 ENCOUNTER — Ambulatory Visit: Admitting: Internal Medicine

## 2023-12-17 ENCOUNTER — Ambulatory Visit (INDEPENDENT_AMBULATORY_CARE_PROVIDER_SITE_OTHER): Admitting: Gastroenterology

## 2023-12-17 ENCOUNTER — Encounter: Payer: Self-pay | Admitting: Gastroenterology

## 2023-12-17 VITALS — BP 124/78 | HR 55 | Temp 98.3°F | Ht 67.0 in | Wt 207.4 lb

## 2023-12-17 DIAGNOSIS — K760 Fatty (change of) liver, not elsewhere classified: Secondary | ICD-10-CM

## 2023-12-17 DIAGNOSIS — R161 Splenomegaly, not elsewhere classified: Secondary | ICD-10-CM

## 2023-12-17 DIAGNOSIS — Z860100 Personal history of colon polyps, unspecified: Secondary | ICD-10-CM

## 2023-12-17 DIAGNOSIS — I8500 Esophageal varices without bleeding: Secondary | ICD-10-CM

## 2023-12-17 DIAGNOSIS — D509 Iron deficiency anemia, unspecified: Secondary | ICD-10-CM | POA: Diagnosis not present

## 2023-12-17 DIAGNOSIS — R109 Unspecified abdominal pain: Secondary | ICD-10-CM | POA: Diagnosis not present

## 2023-12-17 DIAGNOSIS — R102 Pelvic and perineal pain: Secondary | ICD-10-CM

## 2023-12-17 NOTE — Patient Instructions (Signed)
 Please have blood work completed, and I will be in touch with the results!  I am discussing further evaluation with Dr. Cinderella.   I will be in touch on MyChart!  I enjoyed seeing you again today! I value our relationship and want to provide genuine, compassionate, and quality care. You may receive a survey regarding your visit with me, and I welcome your feedback! Thanks so much for taking the time to complete this. I look forward to seeing you again.      Therisa MICAEL Stager, PhD, ANP-BC [facility]

## 2023-12-17 NOTE — Progress Notes (Signed)
 Gastroenterology Office Note     Primary Care Physician:  Avelina Greig BRAVO, MD  Primary Gastroenterologist: Dr Cinderella   Chief Complaint   Chief Complaint  Patient presents with   Follow-up    Pt arrives for follow up. Pt states no new issues/concerns.      History of Present Illness   Alex Castaneda is a 68 y.o. male presenting today with a history of suprapubic abdominal pain, nausea, GERD, IDA, HLD, HTN, OA, thyroid  disease, hepatic steatosis, BPH and urinary obstruction s/p TUIP in March 2025, undergoing EGD and colonoscopy in interim from last appt in May 2025. Findings of Grade 1 varices, negative H.pylori. Colonoscopy with small polyp resected but tissue not retrieved.   Due to findings of varices, US  elastography completed with good IQR ratio and median kPa less than 5. Fatty liver and mildly enlarged spleen noted. No thrombocytopenia.   He has seen Urology as well and underwent cystoscopy due to persistent pain, but this was negative. CT on file as well April 2025 due to pain without any abnormality but did show small left fat containing inguinal hernia.   Presents today stating he continues to have lower abdominal pain each morning. Wakes with pain. Will take pain medication every few days to help calm the pain, which helps for a bit. He is trying to limit this. Tylenol  intermittently. He has increased exercise. Throughout the day, the pain will ease off with pain medication. BM daily. No improvement with pain after BM. No loss of appetite. Tracking food. Eating well.   No fatigue. Not taking oral  iron. Nausea resolved. Omeprazole once daily.     EGD November 17, 2023: Grade I esophageal varices.                           - Gastroesophageal flap valve classified as Hill                            Grade IV (no fold, wide open lumen, hiatal hernia                            present).                           - 3 cm hiatal hernia.                           - Erythematous  mucosa in the antrum. Biopsied.                           - Normal duodenal bulb and second portion of the                            duodenum. Repeat EGD in 3 years unless starting on NSBB. Path with reactive gastritis, negative H.pylori.   Colonoscopy November 17, 2023: 3 mm sessile polyp in cecum s/p resection complete but tissue not retrieved, non-bleeding internal and external hemorrhoids. May need capsule study if no improvement in IDA.     Past Medical History:  Diagnosis Date   Abnormal LFTs 05/15/2022   Anemia 09/30/2023   Arthritis    Benign prostatic hyperplasia with urinary hesitancy 03/05/2023  Centrilobular emphysema (HCC) 09/30/2023   Colon polyps    GERD (gastroesophageal reflux disease)    Hyperlipidemia    Hypertension    Osteoarthritis 07/13/2019   Spinal stenosis of lumbar region 07/20/2019   Thyroid  disease    Trigeminal neuralgia 07/19/2019    Past Surgical History:  Procedure Laterality Date   COLONOSCOPY N/A 11/17/2023   Procedure: COLONOSCOPY;  Surgeon: Cinderella Deatrice FALCON, MD;  [facility]: [facility];  Service: Endoscopy;  Laterality: N/A;  1:30PM;ASA 2   COLONOSCOPY WITH PROPOFOL  N/A 01/01/2021   Procedure: COLONOSCOPY WITH PROPOFOL ;  Surgeon: Janalyn Keene NOVAK, MD;  Location: [facility];  Service: Endoscopy;  Laterality: N/A;   ELBOW SURGERY Right    ESOPHAGEAL DILATION N/A 11/17/2023   Procedure: DILATION, ESOPHAGUS;  Surgeon: Cinderella Deatrice FALCON, MD;  Location: [facility];  Service: Endoscopy;  Laterality: N/A;  1:30PM;ASA 2   ESOPHAGOGASTRODUODENOSCOPY N/A 11/17/2023   Procedure: EGD (ESOPHAGOGASTRODUODENOSCOPY);  Surgeon: Cinderella Deatrice FALCON, MD;  Location: [facility];  Service: Endoscopy;  Laterality: N/A;  1:30PM;ASA 2   EYE SURGERY  2000   HERNIA REPAIR     Umbilical   KNEE SURGERY Left    arthroscopic   ROTATOR CUFF REPAIR Right    TRANSURETHRAL INCISION OF PROSTATE N/A 08/22/2023   Procedure: INCISION, PROSTATE, TRANSURETHRAL;  Surgeon:  Francisca Redell BROCKS, MD;  Location: [facility];  Service: Urology;  Laterality: N/A;    Current Outpatient Medications  Medication Sig Dispense Refill   acetaminophen  (TYLENOL ) 500 MG tablet Take 500-1,000 mg by mouth every 6 (six) hours as needed (pain.).     amLODipine  (NORVASC ) 5 MG tablet TAKE 1 TABLET(5 MG) BY MOUTH DAILY 90 tablet 3   carboxymethylcellulose (REFRESH PLUS) 0.5 % SOLN Place 1 drop into both eyes 3 (three) times daily as needed (dry/irritated eyes.).     celecoxib  (CELEBREX ) 200 MG capsule Take 1 capsule (200 mg total) by mouth 2 (two) times daily. 20 capsule 0   clindamycin -benzoyl peroxide (BENZACLIN) gel APPLY TOPICALLY TWICE DAILY TO AFFECTED AREA ON FACE, TRUNK 50 g 0   gabapentin  (NEURONTIN ) 300 MG capsule Take 300 mg by mouth 3 (three) times daily as needed.     HYDROcodone -acetaminophen  (NORCO/VICODIN) 5-325 MG tablet Take 1 tablet by mouth every 4 (four) hours as needed. 15 tablet 0   losartan  (COZAAR ) 25 MG tablet Take 1 tablet (25 mg total) by mouth daily. 90 tablet 3   meloxicam  (MOBIC ) 15 MG tablet Take 15 mg by mouth at bedtime as needed for pain.     Omeprazole Magnesium (PRILOSEC PO) Take by mouth. Otc - takes as needed     No current facility-administered medications for this visit.    Allergies as of 12/17/2023 - Review Complete 12/17/2023  Allergen Reaction Noted   Salicylates  03/22/2019    Family History  Problem Relation Age of Onset   Throat cancer Mother        smoker   COPD Sister    Cancer Brother        unknown   Arthritis Sister    Diabetes Sister    Diabetes Sister    Stroke Brother 88       tobacco use    Social History   Socioeconomic History   Marital status: Married    Spouse name: Aleck   Number of children: 3   Years of education: Associates degree   Highest education level: Associate degree: occupational, Scientist, product/process development, or vocational program  Occupational History  Not on file  Tobacco Use   Smoking status: Former     Current packs/day: 0.00    Types: Cigarettes    Quit date: 07/11/1989    Years since quitting: 34.4    Passive exposure: Past   Smokeless tobacco: Never  Vaping Use   Vaping status: Never Used  Substance and Sexual Activity   Alcohol use: Not Currently   Drug use: Never   Sexual activity: Not Currently  Other Topics Concern   Not on file  Social History Narrative   07/12/19   From: moved from [place]  - but from [place] originally    Living: with wife Aleck - 38 years   Work: retired from [employer], Psychiatrist      Family: 2 children - Marissa, Medical illustrator and [place] - live nearby [place], [place]) - grandkids - 8 - and 2 great grandchildren    5 step children      Enjoys: working on cars, walking, helping and supporting family      Exercise: walking - regularly, hard in the winter   Diet: better now      Safety   Seat belts: Yes    Guns: No   Safe in relationships: Yes    Social Drivers of Corporate investment banker Strain: Low Risk  (09/29/2023)   Overall Financial Resource Strain (CARDIA)    Difficulty of Paying Living Expenses: Not hard at all  Food Insecurity: No Food Insecurity (09/29/2023)   Hunger Vital Sign    Worried About Running Out of Food in the Last Year: Never true    Ran Out of Food in the Last Year: Never true  Transportation Needs: No Transportation Needs (09/29/2023)   PRAPARE - Administrator, Civil Service (Medical): No    Lack of Transportation (Non-Medical): No  Physical Activity: Insufficiently Active (09/29/2023)   Exercise Vital Sign    Days of Exercise per Week: 3 days    Minutes of Exercise per Session: 20 min  Stress: No Stress Concern Present (09/29/2023)   [employer] of Occupational Health - Occupational Stress Questionnaire    Feeling of Stress : Not at all  Social Connections: Socially Integrated (09/29/2023)   Social Connection and Isolation Panel    Frequency of Communication with Friends and Family: More than three  times a week    Frequency of Social Gatherings with Friends and Family: Twice a week    Attends Religious Services: More than 4 times per year    Active Member of [employer] or Organizations: Yes    Attends Engineer, structural: More than 4 times per year    Marital Status: Married  Catering manager Violence: Not on file     Review of Systems   Gen: Denies any fever, chills, fatigue, weight loss, lack of appetite.  CV: Denies chest pain, heart palpitations, peripheral edema, syncope.  Resp: Denies shortness of breath at rest or with exertion. Denies wheezing or cough.  GI: Denies dysphagia or odynophagia. Denies jaundice, hematemesis, fecal incontinence. GU : Denies urinary burning, urinary frequency, urinary hesitancy MS: Denies joint pain, muscle weakness, cramps, or limitation of movement.  Derm: Denies rash, itching, dry skin Psych: Denies depression, anxiety, memory loss, and confusion Heme: Denies bruising, bleeding, and enlarged lymph nodes.   Physical Exam   BP 124/78   Pulse (!) 55   Temp 98.3 F (36.8 C)   Ht 5' 7 (1.702 m)   Wt 207 lb 6.4 oz (94.1 kg)  BMI 32.48 kg/m  General:   Alert and oriented. Pleasant and cooperative. Well-nourished and well-developed.  Head:  Normocephalic and atraumatic. Eyes:  Without icterus Abdomen:  +BS, soft, non-tender and non-distended. No HSM noted. No guarding or rebound. No masses appreciated.  Rectal:  Deferred  Msk:  Symmetrical without gross deformities. Normal posture. Extremities:  Without edema. Neurologic:  Alert and  oriented x4;  grossly normal neurologically. Skin:  Intact without significant lesions or rashes. Psych:  Alert and cooperative. Normal mood and affect.   Assessment   AARIAN GRIFFIE is a 68 y.o. male presenting today with a history of suprapubic abdominal pain since April 2025, nausea, GERD, IDA, HLD, HTN, OA, thyroid  disease, hepatic steatosis, BPH and urinary obstruction s/p TUIP in March 2025,  undergoing EGD and colonoscopy in interim from last appt in May 2025. Findings of Grade 1 varices, negative H.pylori. Colonoscopy with small polyp resected but tissue not retrieved. Returns in follow-up today.  Suprapubic abdominal pain: unclear etiology at this point. He has seen Urology with evaluation including cystoscopy. CT April 2025 with only left inguinal hernia. No postprandial precipitating features, and he has no constipation or relief with bowel movements. Colonoscopy on file. Will discuss further with Dr. Cinderella. Query if this is musculoskeletal. Not consistent with chronic mesenteric ischemia.   Hepatic steatosis: low kPa less than 5 on elastography; however, he does have Grade 1 varices on EGD. Mild splenomegaly but no thrombocytopenia. Will check further fibrosis serologies. Hep C antibody negative in 2021.   IDA: no obvious findings on colonoscopy/EGD to contribute to IDA, and varices had no bleeding stigmata.  Unable to exclude a small bowel source. Will update iron studies now. Low threshold for capsule study to wrap up evaluation.   Hx of polyp: not retrieved at time of colonoscopy. 7 year surveillance recommended (2032).   Esophageal varices: does not appear to have significant fibrosis on elastography but ordering further fibrosis serologies. If disconcordant findings with fibrosis scores, may need to further explore non-cirrhotic portal hypertension etiologies. HR 55. Holding off on NSBB for now but may ultimately need and will need to discuss with PCP as currently on a CCB and ARB.    PLAN    Updated iron studies today, fibrosis serologies Discussing further with Dr. Cinderella regarding portal hypertension and separately lower abdominal pain of unclear etiology Holding off on NSBB right now but may need in future: would need to coordinate with PCP May need capsule study if persistent IDA Further recommendations to follow    Alex MICAEL Stager, PhD, ANP-BC [facility]  Gastroenterology

## 2023-12-17 NOTE — H&P (View-Only) (Signed)
 Gastroenterology Office Note     Primary Care Physician:  Avelina Greig BRAVO, MD  Primary Gastroenterologist: Dr Cinderella   Chief Complaint   Chief Complaint  Patient presents with   Follow-up    Pt arrives for follow up. Pt states no new issues/concerns.      History of Present Illness   Alex Castaneda is a 68 y.o. male presenting today with a history of suprapubic abdominal pain, nausea, GERD, IDA, HLD, HTN, OA, thyroid  disease, hepatic steatosis, BPH and urinary obstruction s/p TUIP in March 2025, undergoing EGD and colonoscopy in interim from last appt in May 2025. Findings of Grade 1 varices, negative H.pylori. Colonoscopy with small polyp resected but tissue not retrieved.   Due to findings of varices, US  elastography completed with good IQR ratio and median kPa less than 5. Fatty liver and mildly enlarged spleen noted. No thrombocytopenia.   He has seen Urology as well and underwent cystoscopy due to persistent pain, but this was negative. CT on file as well April 2025 due to pain without any abnormality but did show small left fat containing inguinal hernia.   Presents today stating he continues to have lower abdominal pain each morning. Wakes with pain. Will take pain medication every few days to help calm the pain, which helps for a bit. He is trying to limit this. Tylenol  intermittently. He has increased exercise. Throughout the day, the pain will ease off with pain medication. BM daily. No improvement with pain after BM. No loss of appetite. Tracking food. Eating well.   No fatigue. Not taking oral  iron. Nausea resolved. Omeprazole once daily.     EGD November 17, 2023: Grade I esophageal varices.                           - Gastroesophageal flap valve classified as Hill                            Grade IV (no fold, wide open lumen, hiatal hernia                            present).                           - 3 cm hiatal hernia.                           - Erythematous  mucosa in the antrum. Biopsied.                           - Normal duodenal bulb and second portion of the                            duodenum. Repeat EGD in 3 years unless starting on NSBB. Path with reactive gastritis, negative H.pylori.   Colonoscopy November 17, 2023: 3 mm sessile polyp in cecum s/p resection complete but tissue not retrieved, non-bleeding internal and external hemorrhoids. May need capsule study if no improvement in IDA.     Past Medical History:  Diagnosis Date   Abnormal LFTs 05/15/2022   Anemia 09/30/2023   Arthritis    Benign prostatic hyperplasia with urinary hesitancy 03/05/2023  Centrilobular emphysema (HCC) 09/30/2023   Colon polyps    GERD (gastroesophageal reflux disease)    Hyperlipidemia    Hypertension    Osteoarthritis 07/13/2019   Spinal stenosis of lumbar region 07/20/2019   Thyroid  disease    Trigeminal neuralgia 07/19/2019    Past Surgical History:  Procedure Laterality Date   COLONOSCOPY N/A 11/17/2023   Procedure: COLONOSCOPY;  Surgeon: Cinderella Deatrice FALCON, MD;  [facility]: [facility];  Service: Endoscopy;  Laterality: N/A;  1:30PM;ASA 2   COLONOSCOPY WITH PROPOFOL  N/A 01/01/2021   Procedure: COLONOSCOPY WITH PROPOFOL ;  Surgeon: Janalyn Keene NOVAK, MD;  Location: [facility];  Service: Endoscopy;  Laterality: N/A;   ELBOW SURGERY Right    ESOPHAGEAL DILATION N/A 11/17/2023   Procedure: DILATION, ESOPHAGUS;  Surgeon: Cinderella Deatrice FALCON, MD;  Location: [facility];  Service: Endoscopy;  Laterality: N/A;  1:30PM;ASA 2   ESOPHAGOGASTRODUODENOSCOPY N/A 11/17/2023   Procedure: EGD (ESOPHAGOGASTRODUODENOSCOPY);  Surgeon: Cinderella Deatrice FALCON, MD;  Location: [facility];  Service: Endoscopy;  Laterality: N/A;  1:30PM;ASA 2   EYE SURGERY  2000   HERNIA REPAIR     Umbilical   KNEE SURGERY Left    arthroscopic   ROTATOR CUFF REPAIR Right    TRANSURETHRAL INCISION OF PROSTATE N/A 08/22/2023   Procedure: INCISION, PROSTATE, TRANSURETHRAL;  Surgeon:  Francisca Redell BROCKS, MD;  Location: [facility];  Service: Urology;  Laterality: N/A;    Current Outpatient Medications  Medication Sig Dispense Refill   acetaminophen  (TYLENOL ) 500 MG tablet Take 500-1,000 mg by mouth every 6 (six) hours as needed (pain.).     amLODipine  (NORVASC ) 5 MG tablet TAKE 1 TABLET(5 MG) BY MOUTH DAILY 90 tablet 3   carboxymethylcellulose (REFRESH PLUS) 0.5 % SOLN Place 1 drop into both eyes 3 (three) times daily as needed (dry/irritated eyes.).     celecoxib  (CELEBREX ) 200 MG capsule Take 1 capsule (200 mg total) by mouth 2 (two) times daily. 20 capsule 0   clindamycin -benzoyl peroxide (BENZACLIN) gel APPLY TOPICALLY TWICE DAILY TO AFFECTED AREA ON FACE, TRUNK 50 g 0   gabapentin  (NEURONTIN ) 300 MG capsule Take 300 mg by mouth 3 (three) times daily as needed.     HYDROcodone -acetaminophen  (NORCO/VICODIN) 5-325 MG tablet Take 1 tablet by mouth every 4 (four) hours as needed. 15 tablet 0   losartan  (COZAAR ) 25 MG tablet Take 1 tablet (25 mg total) by mouth daily. 90 tablet 3   meloxicam  (MOBIC ) 15 MG tablet Take 15 mg by mouth at bedtime as needed for pain.     Omeprazole Magnesium (PRILOSEC PO) Take by mouth. Otc - takes as needed     No current facility-administered medications for this visit.    Allergies as of 12/17/2023 - Review Complete 12/17/2023  Allergen Reaction Noted   Salicylates  03/22/2019    Family History  Problem Relation Age of Onset   Throat cancer Mother        smoker   COPD Sister    Cancer Brother        unknown   Arthritis Sister    Diabetes Sister    Diabetes Sister    Stroke Brother 88       tobacco use    Social History   Socioeconomic History   Marital status: Married    Spouse name: Aleck   Number of children: 3   Years of education: Associates degree   Highest education level: Associate degree: occupational, Scientist, product/process development, or vocational program  Occupational History  Not on file  Tobacco Use   Smoking status: Former     Current packs/day: 0.00    Types: Cigarettes    Quit date: 07/11/1989    Years since quitting: 34.4    Passive exposure: Past   Smokeless tobacco: Never  Vaping Use   Vaping status: Never Used  Substance and Sexual Activity   Alcohol use: Not Currently   Drug use: Never   Sexual activity: Not Currently  Other Topics Concern   Not on file  Social History Narrative   07/12/19   From: moved from [place]  - but from [place] originally    Living: with wife Aleck - 38 years   Work: retired from [employer], Psychiatrist      Family: 2 children - Marissa, Medical illustrator and [place] - live nearby [place], [place]) - grandkids - 8 - and 2 great grandchildren    5 step children      Enjoys: working on cars, walking, helping and supporting family      Exercise: walking - regularly, hard in the winter   Diet: better now      Safety   Seat belts: Yes    Guns: No   Safe in relationships: Yes    Social Drivers of Corporate investment banker Strain: Low Risk  (09/29/2023)   Overall Financial Resource Strain (CARDIA)    Difficulty of Paying Living Expenses: Not hard at all  Food Insecurity: No Food Insecurity (09/29/2023)   Hunger Vital Sign    Worried About Running Out of Food in the Last Year: Never true    Ran Out of Food in the Last Year: Never true  Transportation Needs: No Transportation Needs (09/29/2023)   PRAPARE - Administrator, Civil Service (Medical): No    Lack of Transportation (Non-Medical): No  Physical Activity: Insufficiently Active (09/29/2023)   Exercise Vital Sign    Days of Exercise per Week: 3 days    Minutes of Exercise per Session: 20 min  Stress: No Stress Concern Present (09/29/2023)   [employer] of Occupational Health - Occupational Stress Questionnaire    Feeling of Stress : Not at all  Social Connections: Socially Integrated (09/29/2023)   Social Connection and Isolation Panel    Frequency of Communication with Friends and Family: More than three  times a week    Frequency of Social Gatherings with Friends and Family: Twice a week    Attends Religious Services: More than 4 times per year    Active Member of [employer] or Organizations: Yes    Attends Engineer, structural: More than 4 times per year    Marital Status: Married  Catering manager Violence: Not on file     Review of Systems   Gen: Denies any fever, chills, fatigue, weight loss, lack of appetite.  CV: Denies chest pain, heart palpitations, peripheral edema, syncope.  Resp: Denies shortness of breath at rest or with exertion. Denies wheezing or cough.  GI: Denies dysphagia or odynophagia. Denies jaundice, hematemesis, fecal incontinence. GU : Denies urinary burning, urinary frequency, urinary hesitancy MS: Denies joint pain, muscle weakness, cramps, or limitation of movement.  Derm: Denies rash, itching, dry skin Psych: Denies depression, anxiety, memory loss, and confusion Heme: Denies bruising, bleeding, and enlarged lymph nodes.   Physical Exam   BP 124/78   Pulse (!) 55   Temp 98.3 F (36.8 C)   Ht 5' 7 (1.702 m)   Wt 207 lb 6.4 oz (94.1 kg)  BMI 32.48 kg/m  General:   Alert and oriented. Pleasant and cooperative. Well-nourished and well-developed.  Head:  Normocephalic and atraumatic. Eyes:  Without icterus Abdomen:  +BS, soft, non-tender and non-distended. No HSM noted. No guarding or rebound. No masses appreciated.  Rectal:  Deferred  Msk:  Symmetrical without gross deformities. Normal posture. Extremities:  Without edema. Neurologic:  Alert and  oriented x4;  grossly normal neurologically. Skin:  Intact without significant lesions or rashes. Psych:  Alert and cooperative. Normal mood and affect.   Assessment   AARIAN GRIFFIE is a 68 y.o. male presenting today with a history of suprapubic abdominal pain since April 2025, nausea, GERD, IDA, HLD, HTN, OA, thyroid  disease, hepatic steatosis, BPH and urinary obstruction s/p TUIP in March 2025,  undergoing EGD and colonoscopy in interim from last appt in May 2025. Findings of Grade 1 varices, negative H.pylori. Colonoscopy with small polyp resected but tissue not retrieved. Returns in follow-up today.  Suprapubic abdominal pain: unclear etiology at this point. He has seen Urology with evaluation including cystoscopy. CT April 2025 with only left inguinal hernia. No postprandial precipitating features, and he has no constipation or relief with bowel movements. Colonoscopy on file. Will discuss further with Dr. Cinderella. Query if this is musculoskeletal. Not consistent with chronic mesenteric ischemia.   Hepatic steatosis: low kPa less than 5 on elastography; however, he does have Grade 1 varices on EGD. Mild splenomegaly but no thrombocytopenia. Will check further fibrosis serologies. Hep C antibody negative in 2021.   IDA: no obvious findings on colonoscopy/EGD to contribute to IDA, and varices had no bleeding stigmata.  Unable to exclude a small bowel source. Will update iron studies now. Low threshold for capsule study to wrap up evaluation.   Hx of polyp: not retrieved at time of colonoscopy. 7 year surveillance recommended (2032).   Esophageal varices: does not appear to have significant fibrosis on elastography but ordering further fibrosis serologies. If disconcordant findings with fibrosis scores, may need to further explore non-cirrhotic portal hypertension etiologies. HR 55. Holding off on NSBB for now but may ultimately need and will need to discuss with PCP as currently on a CCB and ARB.    PLAN    Updated iron studies today, fibrosis serologies Discussing further with Dr. Cinderella regarding portal hypertension and separately lower abdominal pain of unclear etiology Holding off on NSBB right now but may need in future: would need to coordinate with PCP May need capsule study if persistent IDA Further recommendations to follow    Therisa MICAEL Stager, PhD, ANP-BC [facility]  Gastroenterology

## 2023-12-18 LAB — NASH FIBROSURE(R) PLUS

## 2023-12-19 LAB — COMPREHENSIVE METABOLIC PANEL WITH GFR
ALT: 28 IU/L (ref 0–44)
AST: 26 IU/L (ref 0–40)
Albumin: 4.6 g/dL (ref 3.9–4.9)
Alkaline Phosphatase: 71 IU/L (ref 44–121)
BUN/Creatinine Ratio: 6 — ABNORMAL LOW (ref 10–24)
BUN: 6 mg/dL — ABNORMAL LOW (ref 8–27)
Bilirubin Total: 0.5 mg/dL (ref 0.0–1.2)
CO2: 21 mmol/L (ref 20–29)
Calcium: 9.9 mg/dL (ref 8.6–10.2)
Chloride: 106 mmol/L (ref 96–106)
Creatinine, Ser: 1.04 mg/dL (ref 0.76–1.27)
Globulin, Total: 2.3 g/dL (ref 1.5–4.5)
Glucose: 92 mg/dL (ref 70–99)
Potassium: 4.5 mmol/L (ref 3.5–5.2)
Sodium: 142 mmol/L (ref 134–144)
Total Protein: 6.9 g/dL (ref 6.0–8.5)
eGFR: 79 mL/min/1.73 (ref >59)

## 2023-12-19 LAB — CBC WITH DIFFERENTIAL/PLATELET
Basophils Absolute: 0 x10E3/uL (ref 0.0–0.2)
Basos: 1 %
EOS (ABSOLUTE): 0.3 x10E3/uL (ref 0.0–0.4)
Eos: 7 %
Hematocrit: 41.8 % (ref 37.5–51.0)
Hemoglobin: 13.7 g/dL (ref 13.0–17.7)
Immature Grans (Abs): 0 x10E3/uL (ref 0.0–0.1)
Immature Granulocytes: 0 %
Lymphocytes Absolute: 1.9 x10E3/uL (ref 0.7–3.1)
Lymphs: 52 %
MCH: 27.8 pg (ref 26.6–33.0)
MCHC: 32.8 g/dL (ref 31.5–35.7)
MCV: 85 fL (ref 79–97)
Monocytes Absolute: 0.4 x10E3/uL (ref 0.1–0.9)
Monocytes: 11 %
Neutrophils Absolute: 1.1 x10E3/uL — ABNORMAL LOW (ref 1.4–7.0)
Neutrophils: 29 %
Platelets: 240 x10E3/uL (ref 150–450)
RBC: 4.93 x10E6/uL (ref 4.14–5.80)
RDW: 15.9 % — ABNORMAL HIGH (ref 11.6–15.4)
WBC: 3.7 x10E3/uL (ref 3.4–10.8)

## 2023-12-19 LAB — NASH FIBROSURE(R) PLUS
ALPHA 2-MACROGLOBULINS, QN: 320 mg/dL — ABNORMAL HIGH (ref 110–276)
ALT (SGPT) P5P: 32 IU/L (ref 0–55)
AST (SGOT) P5P: 29 IU/L (ref 0–40)
Apolipoprotein A-1: 140 mg/dL (ref 101–178)
Bilirubin, Total: 0.3 mg/dL (ref 0.0–1.2)
Cholesterol, Total: 167 mg/dL (ref 100–199)
Fibrosis Score: 0.4 — ABNORMAL HIGH (ref 0.00–0.21)
GGT: 14 IU/L (ref 0–65)
Glucose: 93 mg/dL (ref 70–99)
Haptoglobin: 108 mg/dL (ref 32–363)
NASH Score: 0 (ref 0.00–0.25)
Steatosis Score: 0.27 (ref 0.00–0.40)
Triglycerides: 127 mg/dL (ref 0–149)

## 2023-12-19 LAB — IRON,TIBC AND FERRITIN PANEL
Ferritin: 25 ng/mL — ABNORMAL LOW (ref 30–400)
Iron Saturation: 17 % (ref 15–55)
Iron: 69 ug/dL (ref 38–169)
Total Iron Binding Capacity: 396 ug/dL (ref 250–450)
UIBC: 327 ug/dL (ref 111–343)

## 2023-12-19 LAB — ENHANCED LIVER FIBROSIS (ELF): ELF(TM) Score: 9.18 (ref <9.80)

## 2023-12-22 ENCOUNTER — Ambulatory Visit: Payer: Self-pay | Admitting: Gastroenterology

## 2023-12-22 ENCOUNTER — Encounter: Payer: Self-pay | Admitting: Family Medicine

## 2023-12-22 DIAGNOSIS — K760 Fatty (change of) liver, not elsewhere classified: Secondary | ICD-10-CM

## 2023-12-23 NOTE — Telephone Encounter (Signed)
 Mindy and Tammy:  Please arrange capsule study for IDA. Let me know when this is so I can block out a time to read on schedule.  Please also refer to [facility] for fibroscan (Fibroscan only).   Dena: please have patient complete [facility] lab I ordered (schistosoma).

## 2023-12-24 NOTE — Telephone Encounter (Signed)
 Alex Castaneda, patient scheduled for 7/15. Order faced for fibroscan. Let me know where you want to add to your schedule to read or if someone else will read since you will be out. Thanks!

## 2023-12-27 ENCOUNTER — Encounter: Payer: Self-pay | Admitting: Gastroenterology

## 2023-12-30 ENCOUNTER — Encounter (HOSPITAL_COMMUNITY): Payer: Self-pay | Admitting: Gastroenterology

## 2023-12-30 ENCOUNTER — Ambulatory Visit (HOSPITAL_COMMUNITY)
Admission: RE | Admit: 2023-12-30 | Discharge: 2023-12-30 | Disposition: A | Discharge status: Home / Self Care | Attending: Gastroenterology | Admitting: Gastroenterology

## 2023-12-30 ENCOUNTER — Encounter (HOSPITAL_COMMUNITY)
Admission: RE | Disposition: A | Discharge status: Home / Self Care | Payer: Self-pay | Source: Home / Self Care | Attending: Gastroenterology

## 2023-12-30 DIAGNOSIS — D509 Iron deficiency anemia, unspecified: Secondary | ICD-10-CM | POA: Diagnosis not present

## 2023-12-30 DIAGNOSIS — K6389 Other specified diseases of intestine: Secondary | ICD-10-CM | POA: Diagnosis not present

## 2023-12-30 SURGERY — IMAGING PROCEDURE, GI TRACT, INTRALUMINAL, VIA CAPSULE

## 2023-12-30 NOTE — Interval H&P Note (Signed)
 History and Physical Interval Note:  12/30/2023 9:31 AM  Alex Castaneda  has presented today for surgery, with the diagnosis of ida.  The various methods of treatment have been discussed with the patient and family. After consideration of risks, benefits and other options for treatment, the patient has consented to  Procedure(s) with comments: IMAGING PROCEDURE, GI TRACT, INTRALUMINAL, VIA CAPSULE (N/A) - 730am as a surgical intervention.  The patient's history has been reviewed, patient examined, no change in status, stable for surgery.  I have reviewed the patient's chart and labs.  Questions were answered to the patient's satisfaction.     Deatrice FALCON Lametria Klunk

## 2024-01-01 ENCOUNTER — Encounter (HOSPITAL_COMMUNITY): Payer: Self-pay | Admitting: Gastroenterology

## 2024-01-13 ENCOUNTER — Encounter: Payer: Self-pay | Admitting: Gastroenterology

## 2024-01-13 NOTE — Telephone Encounter (Signed)
 Dena: please have patient complete CBC and iron studies in 6 weeks.

## 2024-01-13 NOTE — Telephone Encounter (Signed)
 Capsule study results sent to patient.

## 2024-01-13 NOTE — Procedures (Signed)
 Alex Castaneda

## 2024-01-14 LAB — SCHISTOSOMA IGG, AB, FMI: Schistosoma IgG AB, FMI (Serum): NEGATIVE

## 2024-01-16 ENCOUNTER — Encounter: Payer: Self-pay | Admitting: Urology

## 2024-01-18 ENCOUNTER — Encounter: Payer: Self-pay | Admitting: Family Medicine

## 2024-01-27 ENCOUNTER — Other Ambulatory Visit: Payer: Self-pay

## 2024-01-27 DIAGNOSIS — D509 Iron deficiency anemia, unspecified: Secondary | ICD-10-CM

## 2024-01-27 DIAGNOSIS — K760 Fatty (change of) liver, not elsewhere classified: Secondary | ICD-10-CM

## 2024-01-30 ENCOUNTER — Encounter: Payer: Self-pay | Admitting: Family Medicine

## 2024-01-30 ENCOUNTER — Ambulatory Visit (INDEPENDENT_AMBULATORY_CARE_PROVIDER_SITE_OTHER): Admitting: Family Medicine

## 2024-01-30 VITALS — BP 128/80 | HR 75 | Temp 98.9°F | Ht 67.0 in | Wt 205.0 lb

## 2024-01-30 DIAGNOSIS — J069 Acute upper respiratory infection, unspecified: Secondary | ICD-10-CM | POA: Diagnosis not present

## 2024-01-30 DIAGNOSIS — R051 Acute cough: Secondary | ICD-10-CM

## 2024-01-30 LAB — POC COVID19 BINAXNOW: SARS Coronavirus 2 Ag: NEGATIVE

## 2024-01-30 NOTE — Assessment & Plan Note (Signed)
 Acute, negative COVID testing in office.  Most consistent with viral upper respiratory tract infection No cough at night.  Recommend Mucinex  DM, Flonase 2 sprays per nostril daily and pushing water. Go to ER if severe shortness of breath. Return and ER precautions provided.

## 2024-01-30 NOTE — Progress Notes (Signed)
 Patient ID: Alex Castaneda, male    DOB: 11-Feb-1956, 68 y.o.   MRN: [id number]  This visit was conducted in person.  BP 128/80   Pulse 75   Temp 98.9 F (37.2 C) (Temporal)   Ht 5' 7 (1.702 m)   Wt 205 lb (93 kg)   SpO2 98%   BMI 32.11 kg/m    CC:  Chief Complaint  Patient presents with   Cough    Symptoms started on Tuesday   Scratchy Throat   Sneezing   Headache    Subjective:   HPI: Alex Castaneda is a 68 y.o. male presenting on 01/30/2024 for Cough (Symptoms started on Tuesday), Scratchy Throat, Sneezing, and Headache   Date of onset: 4 days ago Initial symptoms included   ST, sneeze Symptoms progressed to dry cough, runny nose, head pressure  No ear pain  No fever  Mild SOB, no wheeze.  No ache  No rash   Sick contacts:  been to gym COVID testing:   none     He has tried to treat with  flonase, mucinex      No history of chronic lung disease such as asthma or COPD. Non-smoker.       Relevant past medical, surgical, family and social history reviewed and updated as indicated. Interim medical history since our last visit reviewed. Allergies and medications reviewed and updated. Outpatient Medications Prior to Visit  Medication Sig Dispense Refill   acetaminophen  (TYLENOL ) 500 MG tablet Take 500-1,000 mg by mouth every 6 (six) hours as needed (pain.).     amLODipine  (NORVASC ) 5 MG tablet TAKE 1 TABLET(5 MG) BY MOUTH DAILY 90 tablet 3   carboxymethylcellulose (REFRESH PLUS) 0.5 % SOLN Place 1 drop into both eyes 3 (three) times daily as needed (dry/irritated eyes.).     clindamycin -benzoyl peroxide (BENZACLIN) gel APPLY TOPICALLY TWICE DAILY TO AFFECTED AREA ON FACE, TRUNK 50 g 0   gabapentin  (NEURONTIN ) 300 MG capsule Take 300 mg by mouth 3 (three) times daily as needed.     losartan  (COZAAR ) 25 MG tablet Take 1 tablet (25 mg total) by mouth daily. 90 tablet 3   meloxicam  (MOBIC ) 15 MG tablet Take 15 mg by mouth at bedtime as needed for pain.      Omeprazole Magnesium (PRILOSEC PO) Take by mouth. Otc - takes as needed     celecoxib  (CELEBREX ) 200 MG capsule Take 1 capsule (200 mg total) by mouth 2 (two) times daily. 20 capsule 0   HYDROcodone -acetaminophen  (NORCO/VICODIN) 5-325 MG tablet Take 1 tablet by mouth every 4 (four) hours as needed. 15 tablet 0   No facility-administered medications prior to visit.     Per HPI unless specifically indicated in ROS section below Review of Systems  Constitutional:  Negative for fatigue and fever.  HENT:  Negative for ear pain.   Eyes:  Negative for pain.  Respiratory:  Negative for cough and shortness of breath.   Cardiovascular:  Negative for chest pain, palpitations and leg swelling.  Gastrointestinal:  Negative for abdominal pain.  Genitourinary:  Negative for dysuria.  Musculoskeletal:  Negative for arthralgias.  Neurological:  Negative for syncope, light-headedness and headaches.  Psychiatric/Behavioral:  Negative for dysphoric mood.    Objective:  BP 128/80   Pulse 75   Temp 98.9 F (37.2 C) (Temporal)   Ht 5' 7 (1.702 m)   Wt 205 lb (93 kg)   SpO2 98%   BMI 32.11 kg/m   Wt Readings  from Last 3 Encounters:  01/30/24 205 lb (93 kg)  12/17/23 207 lb 6.4 oz (94.1 kg)  11/25/23 207 lb 6 oz (94.1 kg)      Physical Exam Constitutional:      General: He is not in acute distress.    Appearance: Normal appearance. He is well-developed. He is not ill-appearing or toxic-appearing.  HENT:     Head: Normocephalic and atraumatic.     Right Ear: Hearing, tympanic membrane, ear canal and external ear normal. No tenderness. No foreign body. Tympanic membrane is not retracted or bulging.     Left Ear: Hearing, tympanic membrane, ear canal and external ear normal. No tenderness. No foreign body. Tympanic membrane is not retracted or bulging.     Nose: Nose normal. No mucosal edema or rhinorrhea.     Right Sinus: No maxillary sinus tenderness or frontal sinus tenderness.     Left Sinus:  No maxillary sinus tenderness or frontal sinus tenderness.     Mouth/Throat:     Dentition: Normal dentition. No dental caries.     Pharynx: Uvula midline. No oropharyngeal exudate.     Tonsils: No tonsillar abscesses.  Eyes:     General: Lids are normal. Lids are everted, no foreign bodies appreciated.     Conjunctiva/sclera: Conjunctivae normal.     Pupils: Pupils are equal, round, and reactive to light.  Neck:     Thyroid : No thyroid  mass or thyromegaly.     Vascular: No carotid bruit.     Trachea: Trachea and phonation normal.  Cardiovascular:     Rate and Rhythm: Normal rate and regular rhythm.     Pulses: Normal pulses.     Heart sounds: Normal heart sounds, S1 normal and S2 normal. No murmur heard.    No gallop.  Pulmonary:     Effort: Pulmonary effort is normal. No respiratory distress.     Breath sounds: Normal breath sounds. No wheezing, rhonchi or rales.  Abdominal:     General: Bowel sounds are normal.     Palpations: Abdomen is soft.     Tenderness: There is no abdominal tenderness. There is no guarding or rebound.     Hernia: No hernia is present.  Musculoskeletal:     Cervical back: Normal range of motion and neck supple.  Skin:    General: Skin is warm and dry.     Findings: No rash.  Neurological:     Mental Status: He is alert.     Deep Tendon Reflexes: Reflexes are normal and symmetric.  Psychiatric:        Speech: Speech normal.        Behavior: Behavior normal.        Judgment: Judgment normal.       Results for orders placed or performed in visit on 01/30/24  POC COVID-19   Collection Time: 01/30/24 12:05 PM  Result Value Ref Range   SARS Coronavirus 2 Ag Negative Negative    Assessment and Plan  Acute cough -     POC COVID-19 BinaxNow  Viral URI Assessment & Plan: Acute, negative COVID testing in office.  Most consistent with viral upper respiratory tract infection No cough at night.  Recommend Mucinex  DM, Flonase 2 sprays per nostril  daily and pushing water. Go to ER if severe shortness of breath. Return and ER precautions provided.     No follow-ups on file.   Greig Ring, MD

## 2024-02-04 DIAGNOSIS — K766 Portal hypertension: Secondary | ICD-10-CM

## 2024-02-04 DIAGNOSIS — R161 Splenomegaly, not elsewhere classified: Secondary | ICD-10-CM

## 2024-02-04 DIAGNOSIS — K760 Fatty (change of) liver, not elsewhere classified: Secondary | ICD-10-CM

## 2024-02-24 NOTE — Telephone Encounter (Signed)
 F1/F2 fibrosis on fibroscan, with severe fatty liver. 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 ELF is 9.18.   No CBD dilation on prior imaging. Would recommend MRI and other labs to exclude non-cirrhotic portal hypertension.

## 2024-02-27 NOTE — Telephone Encounter (Signed)
 Further labs are ordered to evaluate non-cirrhotic portal hypertension. Low kPA raises suspicion for non-cirrhotic etiology.   Portal vein is patent on US .   Spleen is slightly enlarged at 13.1 cm. No thrombocytopenia. Platelets 240.   No known hx of HF that we are aware, cardiomyopathy, etc. Schisto negative. Doubt Budd-Chiari but ordering MRI/MRCP to evaluate for [facility] and this can be helpful for diagnosis.    Checking serologies to rule out underlying liver disease, sarcoidosis. I reviewed CBC with differentials and  note neutrophil percentage fluctuating over the years with low percentages and downtrending since last year, lymphocytes elevated intermittently.   Need to update CBC with diff now as well. Refer to Hematology due to splenomegaly. Will continue with work-up from our end and also refer to Hematology due to splenomegaly.    Mindy/Tammy: please arrange MRI/MRCP due to liver disease, esophageal varices, evaluate for PSC  Please also refer to Hematology due to splenomegaly, no cirrhosis or thrombocytosis but evidence for non-cirrhotic portal hypertension, evaluate further.

## 2024-02-27 NOTE — Addendum Note (Signed)
 Addended by: SHIRLEAN THERISA ORN on: 02/27/2024 11:57 AM   Modules accepted: Orders

## 2024-03-01 NOTE — Addendum Note (Signed)
 Addended by: JEANELL GRAEME RAMAN on: 03/01/2024 10:25 AM   Modules accepted: Orders

## 2024-03-01 NOTE — Telephone Encounter (Signed)
 Referral placed. MRI/,MRCP scheduled and sent to mychart

## 2024-03-02 ENCOUNTER — Other Ambulatory Visit (INDEPENDENT_AMBULATORY_CARE_PROVIDER_SITE_OTHER): Payer: Self-pay | Admitting: Gastroenterology

## 2024-03-02 NOTE — Progress Notes (Signed)
 Pt came by office to have lab orders from 02/27/24 changed from [facility] to [facility]. Attempted to change them to [facility] but not all lab orders can be done at [facility]. Informed pt that I could split the labs up or he could go to [facility] since they were all ordered there. Pt states he would go to [facility]

## 2024-03-04 ENCOUNTER — Encounter: Payer: Self-pay | Admitting: Gastroenterology

## 2024-03-05 ENCOUNTER — Other Ambulatory Visit: Payer: Self-pay | Admitting: Gastroenterology

## 2024-03-05 ENCOUNTER — Ambulatory Visit (HOSPITAL_COMMUNITY)
Admission: RE | Admit: 2024-03-05 | Discharge: 2024-03-05 | Disposition: A | Discharge status: Home / Self Care | Source: Ambulatory Visit | Attending: Gastroenterology | Admitting: Gastroenterology

## 2024-03-05 DIAGNOSIS — K766 Portal hypertension: Secondary | ICD-10-CM | POA: Diagnosis present

## 2024-03-05 DIAGNOSIS — K760 Fatty (change of) liver, not elsewhere classified: Secondary | ICD-10-CM | POA: Diagnosis present

## 2024-03-05 LAB — IGG, IGA, IGM
IgA/Immunoglobulin A, Serum: 116 mg/dL (ref 61–437)
IgG (Immunoglobin G), Serum: 987 mg/dL (ref 603–1613)
IgM (Immunoglobulin M), Srm: 203 mg/dL — ABNORMAL HIGH (ref 20–172)

## 2024-03-05 LAB — ANA W/REFLEX IF POSITIVE: Anti Nuclear Antibody (ANA): NEGATIVE

## 2024-03-05 LAB — ANTI-MICROSOMAL ANTIBODY LIVER / KIDNEY: LKM1 Ab: 0.7 U (ref 0.0–20.0)

## 2024-03-05 LAB — ALPHA-1-ANTITRYPSIN DEFICIENCY

## 2024-03-05 LAB — MITOCHONDRIAL/SMOOTH MUSCLE AB PNL
Mitochondrial Ab: <20 U (ref 0.0–20.0)
Smooth Muscle Ab: 19 U (ref 0–19)

## 2024-03-05 LAB — HEPATITIS B SURFACE ANTIBODY,QUALITATIVE: Hep B Surface Ab, Qual: REACTIVE

## 2024-03-05 LAB — HEPATITIS B SURFACE ANTIGEN: Hepatitis B Surface Ag: NEGATIVE

## 2024-03-05 LAB — HEPATITIS C ANTIBODY: Hep C Virus Ab: NONREACTIVE

## 2024-03-05 LAB — CERULOPLASMIN: Ceruloplasmin: 21.4 mg/dL (ref 16.0–31.0)

## 2024-03-05 LAB — HEPATITIS A ANTIBODY, TOTAL: hep A Total Ab: POSITIVE — AB

## 2024-03-05 LAB — ANGIOTENSIN CONVERTING ENZYME: Angio Convert Enzyme: 24 U/L (ref 14–82)

## 2024-03-05 MED ORDER — GADOBUTROL 1 MMOL/ML IV SOLN
9.0000 mL | Freq: Once | INTRAVENOUS | Status: AC | PRN
  Administered 2024-03-05: 9 mL via INTRAVENOUS

## 2024-03-11 ENCOUNTER — Ambulatory Visit: Payer: Self-pay | Admitting: Gastroenterology

## 2024-03-11 ENCOUNTER — Inpatient Hospital Stay: Attending: Oncology | Admitting: Oncology

## 2024-03-11 ENCOUNTER — Inpatient Hospital Stay

## 2024-03-11 VITALS — BP 154/99 | HR 66 | Temp 97.6°F | Resp 18 | Ht 67.0 in | Wt 197.5 lb

## 2024-03-11 DIAGNOSIS — I10 Essential (primary) hypertension: Secondary | ICD-10-CM | POA: Insufficient documentation

## 2024-03-11 DIAGNOSIS — K5900 Constipation, unspecified: Secondary | ICD-10-CM | POA: Insufficient documentation

## 2024-03-11 DIAGNOSIS — K760 Fatty (change of) liver, not elsewhere classified: Secondary | ICD-10-CM | POA: Insufficient documentation

## 2024-03-11 DIAGNOSIS — D509 Iron deficiency anemia, unspecified: Secondary | ICD-10-CM | POA: Diagnosis not present

## 2024-03-11 DIAGNOSIS — E785 Hyperlipidemia, unspecified: Secondary | ICD-10-CM | POA: Diagnosis not present

## 2024-03-11 DIAGNOSIS — Z79899 Other long term (current) drug therapy: Secondary | ICD-10-CM | POA: Insufficient documentation

## 2024-03-11 DIAGNOSIS — E611 Iron deficiency: Secondary | ICD-10-CM

## 2024-03-11 DIAGNOSIS — R161 Splenomegaly, not elsewhere classified: Secondary | ICD-10-CM | POA: Insufficient documentation

## 2024-03-11 NOTE — Patient Instructions (Signed)
 You were seen and examined today by Dr. Davonna. Dr. Davonna is a hematologist, meaning that she specializes in blood abnormalities. Dr. Davonna discussed your past medical history, family history of cancers/blood conditions and the events that led to you being here today.   Dr. Davonna has recommended additional labs today for further evaluation.  Follow-up as needed.

## 2024-03-11 NOTE — Progress Notes (Signed)
 [facility] at [facility]  HEMATOLOGY NEW VISIT  Avelina Greig BRAVO, MD  REASON FOR REFERRAL: Splenomegaly  SUMMARY OF HEMATOLOGIC HISTORY: -11/26/2023: US  abdomen: Spleen is enlarged at 13.1 cm overall. -03/05/2024: MRI abdomen: Spleen within normal limits.    HISTORY OF PRESENT ILLNESS: Alex Castaneda 68 y.o. male referred for splenomegaly. The patient is accompanied by his wife.  He has a medical history of iron deficiency anemia, hypertension, hyperlipidemia, osteoarthritis, and hepatic steatosis.    He had an US  of the abdomen on 11/26/2023 that found a mildly enlarged spleen at 13.1 cm. However, MRI abdomen done on 03/05/2024 found a normal sized spleen. Petro reports an intentional weight loss. He denies any fevers, chills, night sweats, new lumps/bumps, or loss of appetite.   Egan has iron deficiency anemia and has had a colonoscopy and capsule study done that found no etiology of iron deficiency.   He is taking iron supplements daily and notes constipation, which he is treating with Miralex. I have recommended he switch iron supplements to every other day, as it is better tolerated.    Sherron and his wife live in [place], [place]. He is not an active smoker.   I have reviewed the past medical history, past surgical history, social history and family history with the patient   ALLERGIES:  has no known allergies.  MEDICATIONS:  Current Outpatient Medications  Medication Sig Dispense Refill   acetaminophen  (TYLENOL ) 500 MG tablet Take 500-1,000 mg by mouth every 6 (six) hours as needed (pain.).     carboxymethylcellulose (REFRESH PLUS) 0.5 % SOLN Place 1 drop into both eyes 3 (three) times daily as needed (dry/irritated eyes.).     clindamycin -benzoyl peroxide (BENZACLIN) gel APPLY TOPICALLY TWICE DAILY TO AFFECTED AREA ON FACE, TRUNK 50 g 0   gabapentin  (NEURONTIN ) 300 MG capsule Take 300 mg by mouth 3 (three) times daily as needed.     losartan  (COZAAR )  25 MG tablet Take 1 tablet (25 mg total) by mouth daily. 90 tablet 3   meloxicam  (MOBIC ) 15 MG tablet Take 15 mg by mouth at bedtime as needed for pain.     Omeprazole Magnesium (PRILOSEC PO) Take by mouth. Otc - takes as needed     No current facility-administered medications for this visit.     REVIEW OF SYSTEMS:   Constitutional: Denies fevers, chills or night sweats Eyes: Denies blurriness of vision Ears, nose, mouth, throat, and face: Denies mucositis or sore throat Respiratory: Denies cough, dyspnea or wheezes Cardiovascular: Denies palpitation, chest discomfort or lower extremity swelling Gastrointestinal:  Denies nausea, heartburn or change in bowel habits Skin: Denies abnormal skin rashes Lymphatics: Denies new lymphadenopathy or easy bruising Neurological:Denies numbness, tingling or new weaknesses Behavioral/Psych: Mood is stable, no new changes  All other systems were reviewed with the patient and are negative.  PHYSICAL EXAMINATION:   Vitals:   03/11/24 1346 03/11/24 1351  BP: (!) 150/91 (!) 154/99  Pulse: 66   Resp: 18   Temp: 97.6 F (36.4 C)   SpO2: 98%     GENERAL:alert, no distress and comfortable SKIN: skin color, texture, turgor are normal, no rashes or significant lesions LYMPH:  no palpable lymphadenopathy in the cervical, axillary or inguinal LUNGS: clear to auscultation and percussion with normal breathing effort HEART: regular rate & rhythm and no murmurs and no lower extremity edema ABDOMEN:abdomen soft, non-tender and normal bowel sounds Musculoskeletal:no cyanosis of digits and no clubbing  NEURO: alert & oriented x  3 with fluent speech, no focal motor/sensory deficits  LABORATORY DATA:  I have reviewed the data as listed  Lab Results  Component Value Date   WBC 3.7 12/17/2023   NEUTROABS 1.1 (L) 12/17/2023   HGB 13.7 12/17/2023   HCT 41.8 12/17/2023   MCV 85 12/17/2023   PLT 240 12/17/2023     Chemistry      Component Value  Date/Time   NA 142 12/17/2023 0955   K 4.5 12/17/2023 0955   CL 106 12/17/2023 0955   CO2 21 12/17/2023 0955   BUN 6 (L) 12/17/2023 0955   CREATININE 1.04 12/17/2023 0955   GLU 93 12/17/2023 0955      Component Value Date/Time   CALCIUM 9.9 12/17/2023 0955   ALKPHOS 71 12/17/2023 0955   AST 26 12/17/2023 0955   ALT 28 12/17/2023 0955   BILITOT 0.5 12/17/2023 0955       Latest Reference Range & Units 12/17/23 09:55  Iron 38 - 169 ug/dL 69  UIBC 888 - 656 ug/dL 672  TIBC 749 - 549 ug/dL 603  Ferritin 30 - 599 ng/mL 25 (L)  Iron Saturation 15 - 55 % 17  (L): Data is abnormally low RADIOGRAPHIC STUDIES: I have personally reviewed the radiological images as listed and agreed with the findings in the report.  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  EXAM: MRI ABDOMEN WITHOUT AND WITH CONTRAST (INCLUDING MRCP)  TECHNIQUE: Multiplanar multisequence MR imaging of the abdomen was performed both before and after the administration of intravenous contrast. Heavily T2-weighted images of the biliary and pancreatic ducts were obtained, and three-dimensional MRCP images were rendered by post processing.  CONTRAST:  9mL GADAVIST  GADOBUTROL  1 MMOL/ML IV SOLN  COMPARISON:  CT scan abdomen and pelvis from 10/06/2023.  FINDINGS: Lower chest: Unremarkable MR appearance to the lung bases. No pleural effusion. No pericardial effusion. Normal heart size.  Hepatobiliary: The liver is top-normal in size. Noncirrhotic configuration. There is mild to moderate diffuse hepatic steatosis. No focal mass. Normal diameter of portal vein. Portal vein and its major tributaries as well as hepatic veins are patent. No intrahepatic or extrahepatic bile duct dilatation. No bile duct strictures, diverticula or mural irregularities. No choledocholithiasis. Unremarkable gallbladder.  Pancreas: No mass,  inflammatory changes or other parenchymal abnormality identified. No main pancreatic duct dilation.  Spleen:  Within normal limits in size and appearance. No focal mass.  Adrenals/Urinary Tract: Unremarkable adrenal glands. No hydroureteronephrosis on either side. There are scattered sub 5 mm simple cysts in the left kidney. No suspicious renal mass on either side.  Stomach/Bowel: There is a tiny sliding hiatal hernia. Visualized portions within the abdomen are unremarkable. No disproportionate dilation of bowel loops. Unremarkable appendix.  Vascular/Lymphatic: No pathologically enlarged lymph nodes identified. No abdominal aortic aneurysm demonstrated. No ascites.  Other:  Periumbilical surgical scar noted.  Musculoskeletal: No suspicious bone lesions identified.  IMPRESSION: 1. No intrahepatic or extrahepatic bile duct dilation. No choledocholithiasis. No imaging signs of cirrhosis or PSC (primary sclerosing cholangitis). No sequela of portal hypertension. 2. Mild-to-moderate diffuse hepatic steatosis. No focal liver lesion. 3. Otherwise essentially unremarkable exam, as described above.  Electronically Signed   By: Ree Molt M.D.   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  EXAM: MRI ABDOMEN WITHOUT  AND WITH CONTRAST (INCLUDING MRCP)  TECHNIQUE: Multiplanar multisequence MR imaging of the abdomen was performed both before and after the administration of intravenous contrast. Heavily T2-weighted images of the biliary and pancreatic ducts were obtained, and three-dimensional MRCP images were rendered by post processing.  CONTRAST:  9mL GADAVIST  GADOBUTROL  1 MMOL/ML IV SOLN  COMPARISON:  CT scan abdomen and pelvis from 10/06/2023.  FINDINGS: Lower chest: Unremarkable MR appearance to the lung bases. No pleural effusion. No pericardial  effusion. Normal heart size.  Hepatobiliary: The liver is top-normal in size. Noncirrhotic configuration. There is mild to moderate diffuse hepatic steatosis. No focal mass. Normal diameter of portal vein. Portal vein and its major tributaries as well as hepatic veins are patent. No intrahepatic or extrahepatic bile duct dilatation. No bile duct strictures, diverticula or mural irregularities. No choledocholithiasis. Unremarkable gallbladder.  Pancreas: No mass, inflammatory changes or other parenchymal abnormality identified. No main pancreatic duct dilation.  Spleen:  Within normal limits in size and appearance. No focal mass.  Adrenals/Urinary Tract: Unremarkable adrenal glands. No hydroureteronephrosis on either side. There are scattered sub 5 mm simple cysts in the left kidney. No suspicious renal mass on either side.  Stomach/Bowel: There is a tiny sliding hiatal hernia. Visualized portions within the abdomen are unremarkable. No disproportionate dilation of bowel loops. Unremarkable appendix.  Vascular/Lymphatic: No pathologically enlarged lymph nodes identified. No abdominal aortic aneurysm demonstrated. No ascites.  Other:  Periumbilical surgical scar noted.  Musculoskeletal: No suspicious bone lesions identified.  IMPRESSION: 1. No intrahepatic or extrahepatic bile duct dilation. No choledocholithiasis. No imaging signs of cirrhosis or PSC (primary sclerosing cholangitis). No sequela of portal hypertension. 2. Mild-to-moderate diffuse hepatic steatosis. No focal liver lesion. 3. Otherwise essentially unremarkable exam, as described above.  Electronically Signed   By: Ree Molt M.D.   On: 03/05/2024 09:52   ASSESSMENT & PLAN:  Patient is a 68 y.o. male referred for splenomegaly  Assessment & Plan Splenomegaly Patient had mild splenomegaly on ultrasound of abdomen from 11/2023 which was 13.1 Repeat MRI abdomen showed spleen within normal limits Patient  denies fever, chills, weight loss, loss of appetite, bloating.  Overall feels very well  - The initial splenomegaly likely was due to some acute illness -No concerns for lymphoma with no B symptoms and normal CBC -No further workup needed for splenomegaly at this time  No further hematological needs at this time.  Continue to follow with primary care.  Recommended patient to reach out to us  with future questions or concerns Iron deficiency Patient has mild iron deficiency without anemia with TSAT of 17 and ferritin of 25  - Continue oral iron every other day.  Use MiraLAX for constipation -Advised patient not to take oral iron with coffee.   No orders of the defined types were placed in this encounter.   The total time spent in the appointment was 40 minutes encounter with patients including review of chart and various tests results, discussions about plan of care and coordination of care plan   All questions were answered. The patient knows to call the clinic with any problems, questions or concerns. No barriers to learning was detected.   Mickiel Dry, MD 10/3/202511:25 PM

## 2024-03-12 ENCOUNTER — Ambulatory Visit: Payer: Self-pay | Admitting: Gastroenterology

## 2024-03-19 DIAGNOSIS — R161 Splenomegaly, not elsewhere classified: Secondary | ICD-10-CM | POA: Insufficient documentation

## 2024-03-19 DIAGNOSIS — E611 Iron deficiency: Secondary | ICD-10-CM | POA: Insufficient documentation

## 2024-03-19 DIAGNOSIS — D509 Iron deficiency anemia, unspecified: Secondary | ICD-10-CM | POA: Insufficient documentation

## 2024-03-19 NOTE — Assessment & Plan Note (Addendum)
 Patient had mild splenomegaly on ultrasound of abdomen from 11/2023 which was 13.1 Repeat MRI abdomen showed spleen within normal limits Patient denies fever, chills, weight loss, loss of appetite, bloating.  Overall feels very well  - The initial splenomegaly likely was due to some acute illness -No concerns for lymphoma with no B symptoms and normal CBC -No further workup needed for splenomegaly at this time  No further hematological needs at this time.  Continue to follow with primary care.  Recommended patient to reach out to us  with future questions or concerns

## 2024-03-19 NOTE — Assessment & Plan Note (Signed)
 Patient has mild iron deficiency without anemia with TSAT of 17 and ferritin of 25  - Continue oral iron every other day.  Use MiraLAX for constipation -Advised patient not to take oral iron with coffee.

## 2024-03-24 ENCOUNTER — Ambulatory Visit

## 2024-03-24 VITALS — BP 124/82 | Ht 67.0 in | Wt 199.4 lb

## 2024-03-24 DIAGNOSIS — Z0000 Encounter for general adult medical examination without abnormal findings: Secondary | ICD-10-CM

## 2024-03-24 NOTE — Progress Notes (Signed)
 Subjective:   Alex Castaneda is a 68 y.o. who presents for a Medicare Wellness preventive visit.  As a reminder, Annual Wellness Visits don't include a physical exam, and some assessments may be limited, especially if this visit is performed virtually. We may recommend an in-person follow-up visit with your provider if needed.  Visit Complete: In person  Persons Participating in Visit: Patient.  AWV Questionnaire: No: Patient Medicare AWV questionnaire was not completed prior to this visit.  Cardiac Risk Factors include: advanced age (>63men, >70 women);dyslipidemia;hypertension;male gender;obesity (BMI >30kg/m2)     Objective:    Today's Vitals   03/24/24 0810 03/24/24 0811  BP: 124/82   Weight: 199 lb 6.4 oz (90.4 kg)   Height: 5' 7 (1.702 m)   PainSc:  0-No pain   Body mass index is 31.23 kg/m.     03/24/2024    8:19 AM 03/11/2024    1:48 PM 11/17/2023   12:01 PM 10/06/2023    1:37 PM 10/03/2023    4:30 PM 08/22/2023    7:29 AM 08/15/2023    3:26 PM  Advanced Directives  Does Patient Have a Medical Advance Directive? No No No No No No No  Would patient like information on creating a medical advance directive? No - Patient declined No - Patient declined No - Patient declined   No - Patient declined No - Patient declined    Current Medications (verified) Outpatient Encounter Medications as of 03/24/2024  Medication Sig   acetaminophen  (TYLENOL ) 500 MG tablet Take 500-1,000 mg by mouth every 6 (six) hours as needed (pain.).   carboxymethylcellulose (REFRESH PLUS) 0.5 % SOLN Place 1 drop into both eyes 3 (three) times daily as needed (dry/irritated eyes.).   clindamycin -benzoyl peroxide (BENZACLIN) gel APPLY TOPICALLY TWICE DAILY TO AFFECTED AREA ON FACE, TRUNK   gabapentin  (NEURONTIN ) 300 MG capsule Take 300 mg by mouth 3 (three) times daily as needed.   losartan  (COZAAR ) 25 MG tablet Take 1 tablet (25 mg total) by mouth daily.   meloxicam  (MOBIC ) 15 MG tablet Take 15 mg by  mouth at bedtime as needed for pain.   Omeprazole Magnesium (PRILOSEC PO) Take by mouth. Otc - takes as needed   No facility-administered encounter medications on file as of 03/24/2024.    Allergies (verified) Patient has no known allergies.   History: Past Medical History:  Diagnosis Date   Abnormal LFTs 05/15/2022   Anemia 09/30/2023   Arthritis    Benign prostatic hyperplasia with urinary hesitancy 03/05/2023   Centrilobular emphysema (HCC) 09/30/2023   Colon polyps    GERD (gastroesophageal reflux disease)    Hyperlipidemia    Hypertension    Osteoarthritis 07/13/2019   Spinal stenosis of lumbar region 07/20/2019   Thyroid  disease    Trigeminal neuralgia 07/19/2019   Past Surgical History:  Procedure Laterality Date   COLONOSCOPY N/A 11/17/2023   Procedure: COLONOSCOPY;  Surgeon: Alex Deatrice FALCON, MD;  [facility];  Service: Endoscopy;  Laterality: N/A;  1:30PM;ASA 2   COLONOSCOPY WITH PROPOFOL  N/A 01/01/2021   Procedure: COLONOSCOPY WITH PROPOFOL ;  Surgeon: Janalyn Keene NOVAK, MD;  Location: [facility];  Service: Endoscopy;  Laterality: N/A;   ELBOW SURGERY Right    ESOPHAGEAL DILATION N/A 11/17/2023   Procedure: DILATION, ESOPHAGUS;  Surgeon: Alex Deatrice FALCON, MD;  [facility];  Service: Endoscopy;  Laterality: N/A;  1:30PM;ASA 2   ESOPHAGOGASTRODUODENOSCOPY N/A 11/17/2023   Procedure: EGD (ESOPHAGOGASTRODUODENOSCOPY);  Surgeon: Alex Deatrice FALCON, MD;  [facility];  Service: Endoscopy;  Laterality: N/A;  1:30PM;ASA 2   EYE SURGERY  2000   GIVENS CAPSULE STUDY N/A 12/30/2023   Procedure: IMAGING PROCEDURE, GI TRACT, INTRALUMINAL, VIA CAPSULE;  Surgeon: Alex Deatrice FALCON, MD;  [facility]: [facility];  Service: Endoscopy;  Laterality: N/A;  730am   HERNIA REPAIR     Umbilical   KNEE SURGERY Left    arthroscopic   ROTATOR CUFF REPAIR Right    TRANSURETHRAL INCISION OF PROSTATE N/A 08/22/2023   Procedure: INCISION, PROSTATE,  TRANSURETHRAL;  Surgeon: Francisca Redell BROCKS, MD;  Location: [facility];  Service: Urology;  Laterality: N/A;   Family History  Problem Relation Age of Onset   Throat cancer Mother        smoker   COPD Sister    Cancer Brother        unknown   Arthritis Sister    Diabetes Sister    Diabetes Sister    Stroke Brother 5       tobacco use   Social History   Socioeconomic History   Marital status: Married    Spouse name: Alex Castaneda   Number of children: 3   Years of education: Associates degree   Highest education level: Associate degree: occupational, Scientist, product/process development, or vocational program  Occupational History   Not on file  Tobacco Use   Smoking status: Former    Current packs/day: 0.00    Types: Cigarettes    Quit date: 07/11/1989    Years since quitting: 34.7    Passive exposure: Past   Smokeless tobacco: Never  Vaping Use   Vaping status: Never Used  Substance and Sexual Activity   Alcohol use: Not Currently   Drug use: Never   Sexual activity: Not Currently  Other Topics Concern   Not on file  Social History Narrative   07/12/19   From: moved from [place]  - but from [place] originally    Living: with wife Alex Castaneda - 38 years   Work: retired from [place] [employer], Psychiatrist      Family: 2 children - Alex Castaneda, Medical illustrator and Alex Castaneda - live nearby [place], [place]) - grandkids - 8 - and 2 great grandchildren    5 step children      Enjoys: working on cars, walking, helping and supporting family      Exercise: walking - regularly, hard in the winter   Diet: better now      Safety   Seat belts: Yes    Guns: No   Safe in relationships: Yes    Social Drivers of Corporate investment banker Strain: Low Risk  (03/24/2024)   Overall Financial Resource Strain (CARDIA)    Difficulty of Paying Living Expenses: Not hard at all  Food Insecurity: No Food Insecurity (03/11/2024)   Hunger Vital Sign    Worried About Running Out of Food in the Last Year: Never true    Ran Out of Food in the Last  Year: Never true  Transportation Needs: No Transportation Needs (03/24/2024)   PRAPARE - Administrator, Civil Service (Medical): No    Lack of Transportation (Non-Medical): No  Physical Activity: Sufficiently Active (03/24/2024)   Exercise Vital Sign    Days of Exercise per Week: 7 days    Minutes of Exercise per Session: 60 min  Stress: No Stress Concern Present (03/24/2024)   [employer] of Occupational Health - Occupational Stress Questionnaire    Feeling of Stress: Not at all  Social Connections: Socially Integrated (03/24/2024)   Social Connection and Isolation Panel    Frequency of Communication with Friends and Family: More than three times a week    Frequency of Social Gatherings with Friends and Family: Once a week    Attends Religious Services: More than 4 times per year    Active Member of [employer] or Organizations: No    Attends Engineer, structural: More than 4 times per year    Marital Status: Married    Tobacco Counseling Counseling given: Not Answered    Clinical Intake:  Pre-visit preparation completed: Yes  Pain : No/denies pain Pain Score: 0-No pain     BMI - recorded: 31.23 Nutritional Status: BMI > 30  Obese Nutritional Risks: None Diabetes: No  Lab Results  Component Value Date   HGBA1C 5.6 03/12/2023   HGBA1C 6.0 03/14/2022   HGBA1C 5.7 03/27/2021     How often do you need to have someone help you when you read instructions, pamphlets, or other written materials from your doctor or pharmacy?: 1 - Never  Interpreter Needed?: No  Comments: lives with wife Information entered by :: B.Margy Sumler,LPN   Activities of Daily Living     03/24/2024    8:19 AM 08/15/2023    3:28 PM  In your present state of health, do you have any difficulty performing the following activities:  Hearing? 0   Vision? 0   Difficulty concentrating or making decisions? 0   Walking or climbing stairs? 0   Dressing or bathing? 0   Doing errands,  shopping? 0 0  Preparing Food and eating ? N   Using the Toilet? N   In the past six months, have you accidently leaked urine? N   Do you have problems with loss of bowel control? N   Managing your Medications? N   Managing your Finances? N   Housekeeping or managing your Housekeeping? N     Patient Care Team: Avelina Greig BRAVO, MD as PCP - General (Family Medicine) Darliss Rogue, MD as PCP - Cardiology (Cardiology) Rollene Cough, MD as Consulting Physician (Orthopedic Surgery) Rosalind Massie PARAS, MD as Referring Physician (Dermatology) Portia Fireman, OD (Optometry)  I have updated your Care Teams any recent Medical Services you may have received from other providers in the past year.     Assessment:   This is a routine wellness examination for Alex Castaneda.  Hearing/Vision screen Hearing Screening - Comments:: Patient denies any hearing difficulties.   Vision Screening - Comments:: Pt says their vision is good with glasses Dr  Bulakowski-UTD-appt next week   Goals Addressed             This Visit's Progress    Patient Stated   On track    03/24/24-Continue to stay active     Patient Stated       I would like to be closer to the Lord       Depression Screen     03/24/2024    8:17 AM 03/11/2024    1:51 PM 03/11/2024    1:47 PM 03/19/2023    8:19 AM 03/05/2023    9:26 AM 03/14/2022    9:05 AM 06/15/2020    8:01 AM  PHQ 2/9 Scores  PHQ - 2 Score 0 0 0 0 0 0 0  PHQ- 9 Score    0 0 0     Fall Risk     03/24/2024    8:12 AM 03/19/2023  8:18 AM 03/05/2023    9:26 AM 03/14/2022    9:05 AM 06/15/2020    8:01 AM  Fall Risk   Falls in the past year? 0 0 0 0 0  Number falls in past yr: 0 0 0 0 0  Injury with Fall? 0 0 0 0 0  Risk for fall due to : No Fall Risks No Fall Risks No Fall Risks    Follow up Education provided;Falls prevention discussed Falls evaluation completed Falls evaluation completed  Falls evaluation completed      Data saved with a previous  flowsheet row definition    MEDICARE RISK AT HOME:  Medicare Risk at Home Any stairs in or around the home?: No If so, are there any without handrails?: Yes Home free of loose throw rugs in walkways, pet beds, electrical cords, etc?: Yes Adequate lighting in your home to reduce risk of falls?: Yes Life alert?: No Use of a cane, walker or w/c?: No Grab bars in the bathroom?: No Shower chair or bench in shower?: No Elevated toilet seat or a handicapped toilet?: Yes (wife uses:pt does not use)  TIMED UP AND GO:  Was the test performed?  Yes  Length of time to ambulate 10 feet: 10 sec Gait steady and fast without use of assistive device  Cognitive Function: 6CIT completed        03/24/2024    8:24 AM  6CIT Screen  What Year? 0 points  What month? 0 points  What time? 0 points  Count back from 20 0 points  Months in reverse 0 points  Repeat phrase 0 points  Total Score 0 points    Immunizations Immunization History  Administered Date(s) Administered   Fluad Quad(high Dose 65+) 02/13/2022   Fluad Trivalent(High Dose 65+) 03/23/2024   INFLUENZA, HIGH DOSE SEASONAL PF 02/21/2023   Influenza, Seasonal, Injecte, Preservative Fre 03/10/2019   Influenza,inj,Quad PF,6+ Mos 02/22/2020, 03/13/2021   Influenza-Unspecified 02/22/2020, 05/18/2023   Moderna Covid-19 Fall Seasonal Vaccine 18yrs & older 07/26/2022   PFIZER(Purple Top)SARS-COV-2 Vaccination 09/15/2019, 10/06/2019, 06/23/2020   PNEUMOCOCCAL CONJUGATE-20 03/14/2022   Pfizer Covid-19 Vaccine Bivalent Booster 54yrs & up 03/23/2021   Pfizer(Comirnaty)Fall Seasonal Vaccine 12 years and older 07/01/2023, 03/15/2024   Pneumococcal Conjugate-13 06/26/2018   Tdap 02/22/2020, 07/26/2022    Screening Tests Health Maintenance  Topic Date Due   Zoster Vaccines- Shingrix (1 of 2) Never done   COVID-19 Vaccine (8 - 2025-26 season) 05/10/2024   Medicare Annual Wellness (AWV)  03/24/2025   Colonoscopy  11/17/2030   DTaP/Tdap/Td  (3 - Td or Tdap) 07/26/2032   Pneumococcal Vaccine: 50+ Years  Completed   Influenza Vaccine  Completed   Hepatitis C Screening  Completed   Meningococcal B Vaccine  Aged Out    Health Maintenance Items Addressed: None due  at this time  Additional Screening:  Vision Screening: Recommended annual ophthalmology exams for early detection of glaucoma and other disorders of the eye. Is the patient up to date with their annual eye exam?  Yes  Who is the provider or what is the name of the office in which the patient attends annual eye exams? Dr Portia  Dental Screening: Recommended annual dental exams for proper oral hygiene  Community Resource Referral / Chronic Care Management: CRR required this visit?  No   CCM required this visit?  Appt scheduled with PCP   Plan:    I have personally reviewed and noted the following in the patient's chart:  Medical and social history Use of alcohol, tobacco or illicit drugs  Current medications and supplements including opioid prescriptions. Patient is not currently taking opioid prescriptions. Functional ability and status Nutritional status Physical activity Advanced directives List of other physicians Hospitalizations, surgeries, and ER visits in previous 12 months Vitals Screenings to include cognitive, depression, and falls Referrals and appointments  In addition, I have reviewed and discussed with patient certain preventive protocols, quality metrics, and best practice recommendations. A written personalized care plan for preventive services as well as general preventive health recommendations were provided to patient.   Erminio LITTIE Saris, LPN   [id number]/06/[id number]   After Visit Summary: (In Person-Declined) Patient declined AVS at this time.  Notes: Nothing significant to report at this time.

## 2024-03-24 NOTE — Patient Instructions (Signed)
 Alex Castaneda,  Thank you for taking the time for your Medicare Wellness Visit. I appreciate your continued commitment to your health goals. Please review the care plan we discussed, and feel free to reach out if I can assist you further.  Medicare recommends these wellness visits once per year to help you and your care team stay ahead of potential health issues. These visits are designed to focus on prevention, allowing your provider to concentrate on managing your acute and chronic conditions during your regular appointments.  Please note that Annual Wellness Visits do not include a physical exam. Some assessments may be limited, especially if the visit was conducted virtually. If needed, we may recommend a separate in-person follow-up with your provider.  Ongoing Care Seeing your primary care provider every 3 to 6 months helps us  monitor your health and provide consistent, personalized care.   Referrals If a referral was made during today's visit and you haven't received any updates within two weeks, please contact the referred provider directly to check on the status.  Recommended Screenings:  Health Maintenance  Topic Date Due   Zoster (Shingles) Vaccine (1 of 2) Never done   COVID-19 Vaccine (8 - 2025-26 season) 05/10/2024   Medicare Annual Wellness Visit  03/24/2025   Colon Cancer Screening  11/17/2030   DTaP/Tdap/Td vaccine (3 - Td or Tdap) 07/26/2032   Pneumococcal Vaccine for age over 11  Completed   Flu Shot  Completed   Hepatitis C Screening  Completed   Meningitis B Vaccine  Aged Out       03/24/2024    8:19 AM  Advanced Directives  Does Patient Have a Medical Advance Directive? No  Would patient like information on creating a medical advance directive? No - Patient declined   Advance Care Planning is important because it: Ensures you receive medical care that aligns with your values, goals, and preferences. Provides guidance to your family and loved ones, reducing the  emotional burden of decision-making during critical moments.  Vision: Annual vision screenings are recommended for early detection of glaucoma, cataracts, and diabetic retinopathy. These exams can also reveal signs of chronic conditions such as diabetes and high blood pressure.  Dental: Annual dental screenings help detect early signs of oral cancer, gum disease, and other conditions linked to overall health, including heart disease and diabetes.  Please see the attached documents for additional preventive care recommendations.

## 2024-03-25 ENCOUNTER — Encounter: Payer: Self-pay | Admitting: Family Medicine

## 2024-03-25 NOTE — Telephone Encounter (Signed)
 I am not showing amolopine on your current medication list.  Just Losartan  25 mg.   Please advise.  Historical Meds said it was discontinued by provider on 03/11/24.  He saw his oncologist that day.  Please advise.

## 2024-03-26 MED ORDER — AMLODIPINE BESYLATE 5 MG PO TABS: 5.0000 mg | ORAL_TABLET | Freq: Every day | ORAL | 3 refills | Status: AC

## 2024-03-31 ENCOUNTER — Ambulatory Visit (INDEPENDENT_AMBULATORY_CARE_PROVIDER_SITE_OTHER): Admitting: Urology

## 2024-03-31 VITALS — BP 133/87 | HR 68 | Ht 70.0 in | Wt 199.0 lb

## 2024-03-31 DIAGNOSIS — Z125 Encounter for screening for malignant neoplasm of prostate: Secondary | ICD-10-CM

## 2024-03-31 DIAGNOSIS — N401 Enlarged prostate with lower urinary tract symptoms: Secondary | ICD-10-CM | POA: Diagnosis not present

## 2024-03-31 DIAGNOSIS — N138 Other obstructive and reflux uropathy: Secondary | ICD-10-CM | POA: Diagnosis not present

## 2024-03-31 LAB — BLADDER SCAN AMB NON-IMAGING: Scan Result: 5

## 2024-03-31 NOTE — Progress Notes (Signed)
   03/31/2024 2:54 PM   Rutha LITTIE Dollar [birth year] [id number]  Reason for visit: Follow up BPH status post TUIP, PSA screening  History: Obstructive urinary symptoms with bothersome side effects from Flomax , underwent TUIP(27g prostate) March 2025 Has done extremely well from a urinary perspective Had lower abdominal pain and nausea of unclear etiology starting 2 months after surgery, workup with CT and cystoscopy was benign, though symptoms have since resolved  Physical Exam: BP 133/87   Pulse 68   Ht 5' 10 (1.778 m)   Wt 199 lb (90.3 kg)   BMI 28.55 kg/m   Imaging/labs: PSA December 2024 normal at 0.8  Today: Denies any urinary complaints, IPSS score 0 with quality-of-life delighted, PVR normal at 5ml Lower abdominal pain and nausea have also resolved  Plan:   BPH: Doing extremely well status post TUIP March 2025 PSA screening: PSA normal December 2024, can continue screening through 70-75 with PCP RTC 1 year PVR and symptom check, can likely follow-up with urology as needed at that point if doing well   Redell JAYSON Burnet, MD  [facility] [address] [place], [place] [address] [phone]

## 2024-04-01 ENCOUNTER — Ambulatory Visit: Admitting: Urology

## 2024-04-08 ENCOUNTER — Ambulatory Visit: Admitting: Family Medicine

## 2024-04-08 ENCOUNTER — Encounter: Payer: Self-pay | Admitting: Family Medicine

## 2024-04-08 ENCOUNTER — Ambulatory Visit: Payer: Self-pay | Admitting: Family Medicine

## 2024-04-08 VITALS — BP 130/80 | HR 65 | Temp 98.6°F | Ht 65.75 in | Wt 197.2 lb

## 2024-04-08 DIAGNOSIS — E782 Mixed hyperlipidemia: Secondary | ICD-10-CM

## 2024-04-08 DIAGNOSIS — D509 Iron deficiency anemia, unspecified: Secondary | ICD-10-CM | POA: Diagnosis not present

## 2024-04-08 DIAGNOSIS — Z125 Encounter for screening for malignant neoplasm of prostate: Secondary | ICD-10-CM

## 2024-04-08 DIAGNOSIS — M1A9XX0 Chronic gout, unspecified, without tophus (tophi): Secondary | ICD-10-CM | POA: Diagnosis not present

## 2024-04-08 DIAGNOSIS — J432 Centrilobular emphysema: Secondary | ICD-10-CM | POA: Diagnosis not present

## 2024-04-08 DIAGNOSIS — H6121 Impacted cerumen, right ear: Secondary | ICD-10-CM | POA: Insufficient documentation

## 2024-04-08 DIAGNOSIS — I10 Essential (primary) hypertension: Secondary | ICD-10-CM

## 2024-04-08 DIAGNOSIS — E559 Vitamin D deficiency, unspecified: Secondary | ICD-10-CM

## 2024-04-08 DIAGNOSIS — D508 Other iron deficiency anemias: Secondary | ICD-10-CM

## 2024-04-08 DIAGNOSIS — K760 Fatty (change of) liver, not elsewhere classified: Secondary | ICD-10-CM

## 2024-04-08 DIAGNOSIS — Z8739 Personal history of other diseases of the musculoskeletal system and connective tissue: Secondary | ICD-10-CM

## 2024-04-08 DIAGNOSIS — R161 Splenomegaly, not elsewhere classified: Secondary | ICD-10-CM

## 2024-04-08 LAB — IBC + FERRITIN
Ferritin: 34.8 ng/mL (ref 22.0–322.0)
Iron: 114 ug/dL (ref 42–165)
Saturation Ratios: 27.2 % (ref 20.0–50.0)
TIBC: 418.6 ug/dL (ref 250.0–450.0)
Transferrin: 299 mg/dL (ref 212.0–360.0)

## 2024-04-08 LAB — LIPID PANEL
Cholesterol: 167 mg/dL (ref 0–200)
HDL: 56.4 mg/dL (ref >39.00)
LDL Cholesterol: 88 mg/dL (ref 0–99)
NonHDL: 110.41
Total CHOL/HDL Ratio: 3
Triglycerides: 114 mg/dL (ref 0.0–149.0)
VLDL: 22.8 mg/dL (ref 0.0–40.0)

## 2024-04-08 LAB — URIC ACID: Uric Acid, Serum: 7 mg/dL (ref 4.0–7.8)

## 2024-04-08 LAB — VITAMIN D 25 HYDROXY (VIT D DEFICIENCY, FRACTURES): VITD: 19.89 ng/mL — ABNORMAL LOW (ref 30.00–100.00)

## 2024-04-08 NOTE — Assessment & Plan Note (Signed)
Stable, chronic.  Continue current medication.  amlodipine 5 mg daily, Toprol XL 50 mg daily

## 2024-04-08 NOTE — Assessment & Plan Note (Signed)
 Due for reevaluation

## 2024-04-08 NOTE — Assessment & Plan Note (Signed)
 Chronic, followed by GI.  Associated with grade 1 varices. Patient has follow-up with GI in the next 2 months.

## 2024-04-08 NOTE — Assessment & Plan Note (Signed)
 Noted on CT scan, patient denies significant shortness of breath.

## 2024-04-08 NOTE — Assessment & Plan Note (Signed)
 Cerumen impaction removal via irrigation Performed by :  Alex Castaneda Consent for procedure obtained verbally. Right ear lavaged/ irrigated with warm water gently. Pt tolerated procedure well, with no complications. After procedure ear clear of cerumen impaction, with minimal ear canal irritation and redness, no bleeding. TM intact and symptoms improved.

## 2024-04-08 NOTE — Assessment & Plan Note (Signed)
 Acute, no longer on allopurinol .  No flares. Check uric acid.

## 2024-04-08 NOTE — Assessment & Plan Note (Addendum)
 Due for re-eval.  2022 calcium CT score 0. Not on statin medication.

## 2024-04-08 NOTE — Assessment & Plan Note (Signed)
 Due for reevaluation of CBC and iron levels. Colonoscopy and endoscopy showed no source 11/2023

## 2024-04-08 NOTE — Assessment & Plan Note (Signed)
 Acute, now resolved Hematology felt splenomegaly likely due to acute illness as it was resolved on repeat eval with MRI.

## 2024-04-08 NOTE — Progress Notes (Signed)
 Patient ID: Alex Castaneda, male    DOB: 10-Jul-1955, 68 y.o.   MRN: [id number]  This visit was conducted in person.  BP 130/80   Pulse 65   Temp 98.6 F (37 C) (Temporal)   Ht 5' 5.75 (1.67 m)   Wt 197 lb 4 oz (89.5 kg)   SpO2 96%   BMI 32.08 kg/m    CC:  Chief Complaint  Patient presents with   Annual Exam    MWV 03/24/2024     Subjective:   HPI: Alex Castaneda is a 68 y.o. male presenting on 04/08/2024 for Annual Exam (MWV 03/24/2024)  The patient presents for  annual review of chronic health problems. He/She also has the following acute concerns today:   AMW 04/04/2024 reviewed.   Eval by gastro for suprapubic pain, unclear cause as well as fatty liver, grade 1 varices  Reviewed last OV note 12/2023  No further suprapubic pain.   US  of the abdomen on 11/26/2023 that found a mildly enlarged spleen at 13.1 cm. However, MRI abdomen done on 03/05/2024 found a normal sized spleen.   Felt likely due to acute illness.  No further work up recommended  Iron deficiency anemia... followed by hematology... no source seen on  colonoscopy/EGD 11/2023    Flowsheet Row Clinical Support from 03/24/2024 in [facility] Total Score 0    Urology Dr. Joeann following BPH, S/P TUIP 2025  HTN: Well-controlled on amlodipine  5 mg daily, Toprol  XL 50 mg daily BP Readings from Last 3 Encounters:  04/08/24 130/80  03/31/24 133/87  03/24/24 124/82    Hyperlipidemia: tolerable control. In past . calcium CT score 0 in 2024 He has been eating more eggs in last year.  Walking  and exercsie 2-3 times a week.   Bradycardia, present for years.SABRA  He is asymptomatic and it is likely secondary to blood pressure medication side effect.     Chronic gout:  no flares in  last year... eating low uric acid diet,   Wt Readings from Last 3 Encounters:  04/08/24 197 lb 4 oz (89.5 kg)  03/31/24 199 lb (90.3 kg)  03/24/24 199 lb 6.4 oz (90.4 kg)     Relevant past medical, surgical, family and social history reviewed and updated as indicated. Interim medical history since our last visit reviewed. Allergies and medications reviewed and updated. Outpatient Medications Prior to Visit  Medication Sig Dispense Refill   acetaminophen  (TYLENOL ) 500 MG tablet Take 500-1,000 mg by mouth every 6 (six) hours as needed (pain.).     amLODipine  (NORVASC ) 5 MG tablet Take 1 tablet (5 mg total) by mouth daily. 90 tablet 3   carboxymethylcellulose (REFRESH PLUS) 0.5 % SOLN Place 1 drop into both eyes 3 (three) times daily as needed (dry/irritated eyes.).     clindamycin -benzoyl peroxide (BENZACLIN) gel APPLY TOPICALLY TWICE DAILY TO AFFECTED AREA ON FACE, TRUNK 50 g 0   gabapentin  (NEURONTIN ) 300 MG capsule Take 300 mg by mouth 3 (three) times daily as needed.     meloxicam  (MOBIC ) 15 MG tablet Take 15 mg by mouth at bedtime as needed for pain.     Omeprazole Magnesium (PRILOSEC PO) Take by mouth. Otc - takes as needed     No facility-administered medications prior to visit.     Per HPI unless specifically indicated in ROS section below Review of Systems  Constitutional:  Negative for fatigue and fever.  HENT:  Negative for  ear pain.   Eyes:  Negative for pain.  Respiratory:  Negative for cough and shortness of breath.   Cardiovascular:  Negative for chest pain, palpitations and leg swelling.  Gastrointestinal:  Negative for abdominal pain.  Genitourinary:  Negative for dysuria.  Musculoskeletal:  Negative for arthralgias.  Neurological:  Negative for syncope, light-headedness and headaches.  Psychiatric/Behavioral:  Negative for dysphoric mood.    Objective:  BP 130/80   Pulse 65   Temp 98.6 F (37 C) (Temporal)   Ht 5' 5.75 (1.67 m)   Wt 197 lb 4 oz (89.5 kg)   SpO2 96%   BMI 32.08 kg/m   Wt Readings from Last 3 Encounters:  04/08/24 197 lb 4 oz (89.5 kg)  03/31/24 199 lb (90.3 kg)  03/24/24 199 lb 6.4 oz (90.4 kg)       Physical Exam Constitutional:      General: He is not in acute distress.    Appearance: Normal appearance. He is well-developed. He is not ill-appearing or toxic-appearing.  HENT:     Head: Normocephalic and atraumatic.     Right Ear: Hearing, tympanic membrane, ear canal and external ear normal. There is impacted cerumen.     Left Ear: Hearing, tympanic membrane, ear canal and external ear normal. No tenderness.  No middle ear effusion. There is no impacted cerumen. Tympanic membrane is not injected, scarred or perforated.     Nose: Nose normal.     Mouth/Throat:     Pharynx: Uvula midline.  Eyes:     General: Lids are normal. Lids are everted, no foreign bodies appreciated.     Conjunctiva/sclera: Conjunctivae normal.     Pupils: Pupils are equal, round, and reactive to light.  Neck:     Thyroid : No thyroid  mass or thyromegaly.     Vascular: No carotid bruit.     Trachea: Trachea and phonation normal.  Cardiovascular:     Rate and Rhythm: Normal rate and regular rhythm.     Pulses: Normal pulses.     Heart sounds: S1 normal and S2 normal. Heart sounds not distant. No murmur heard.    No friction rub. No gallop.     Comments: No peripheral edema Pulmonary:     Effort: Pulmonary effort is normal. No respiratory distress.     Breath sounds: Normal breath sounds. No wheezing, rhonchi or rales.  Abdominal:     General: Bowel sounds are normal.     Palpations: Abdomen is soft.     Tenderness: There is no abdominal tenderness. There is no guarding or rebound.     Hernia: No hernia is present.  Musculoskeletal:     Cervical back: Normal range of motion and neck supple.  Lymphadenopathy:     Cervical: No cervical adenopathy.  Skin:    General: Skin is warm and dry.     Findings: No rash.  Neurological:     Mental Status: He is alert.     Cranial Nerves: No cranial nerve deficit.     Sensory: No sensory deficit.     Gait: Gait normal.     Deep Tendon Reflexes: Reflexes are  normal and symmetric.  Psychiatric:        Speech: Speech normal.        Behavior: Behavior normal.        Thought Content: Thought content normal.        Judgment: Judgment normal.       Results for orders placed or performed in  visit on 03/31/24  Bladder Scan (Post Void Residual) in office   Collection Time: 03/31/24  2:01 PM  Result Value Ref Range   Scan Result 5      COVID 19 screen:  No recent travel or known exposure to COVID19 The patient denies respiratory symptoms of COVID 19 at this time. The importance of social distancing was discussed today.   Assessment and Plan  The patient's preventative maintenance and recommended screening tests for an annual wellness exam were reviewed in full today. Brought up to date unless services declined.  Counselled on the importance of diet, exercise, and its role in overall health and mortality. The patient's FH and SH was reviewed, including their home life, tobacco status, and drug and alcohol status.    Vaccines: Had shingrix vaccine in [place] , and COVID vaccine. Tdap  and fluupto date Prostate Cancer Screen:  Some slight increase in PSA from last year. Lab Results  Component Value Date   PSA 1.06 03/12/2023   PSA 0.55 03/14/2022   PSA 0.76 03/27/2021  Colon Cancer Screen:  Dr. Sherrill 11/2023, repeat in 7 years      Smoking Status: Former, quit 12 years ago, yearly lung cancer screening reviewed 11/2023 ETOH/ drug use: none/none   Problem List Items Addressed This Visit     Centrilobular emphysema (HCC)   Noted on CT scan, patient denies significant shortness of breath.      Hearing loss due to cerumen impaction, right   Cerumen impaction removal via irrigation Performed by :  Arland Morel Consent for procedure obtained verbally. Right ear lavaged/ irrigated with warm water gently. Pt tolerated procedure well, with no complications. After procedure ear clear of cerumen impaction, with minimal ear canal  irritation and redness, no bleeding. TM intact and symptoms improved.       Hepatic steatosis   Chronic, followed by GI.  Associated with grade 1 varices. Patient has follow-up with GI in the next 2 months.      History of gout   Acute, no longer on allopurinol .  No flares. Check uric acid.      Hyperlipidemia    Due for re-eval.  2022 calcium CT score 0. Not on statin medication.       Relevant Orders   Lipid panel   Hypertension - Primary   Stable, chronic.  Continue current medication.  amlodipine  5 mg daily, Toprol  XL 50 mg daily      IDA (iron deficiency anemia)   Relevant Orders   CBC with Differential/Platelet   IBC + Ferritin   Iron deficiency anemia   Due for reevaluation of CBC and iron levels. Colonoscopy and endoscopy showed no source 11/2023      Relevant Orders   CBC with Differential/Platelet   IBC + Ferritin   Splenomegaly   Acute, now resolved Hematology felt splenomegaly likely due to acute illness as it was resolved on repeat eval with MRI.      Vitamin D deficiency   Due for reevaluation.       Relevant Orders   VITAMIN D 25 Hydroxy (Vit-D Deficiency, Fractures)   Other Visit Diagnoses       Chronic gout without tophus, unspecified cause, unspecified site       Relevant Orders   Uric Acid     Prostate cancer screening       Relevant Orders   PSA, Medicare            Greig Ring, MD

## 2024-04-09 MED ORDER — VITAMIN D (ERGOCALCIFEROL) 1.25 MG (50000 UNIT) PO CAPS: 50000.0000 [IU] | ORAL_CAPSULE | ORAL | 0 refills | Status: AC

## 2024-04-12 NOTE — Telephone Encounter (Signed)
 Labs reviewed from 04/08/24 ordered by PCP.   Ferritin 34, iron 114 (improved fro low of 18 and 72, respectively).

## 2024-04-13 NOTE — Telephone Encounter (Signed)
 Terri,   Can you look into this.  I see a order for PSA and CBC but it doesn't look like those were drawn for some reason??

## 2024-04-14 ENCOUNTER — Other Ambulatory Visit (INDEPENDENT_AMBULATORY_CARE_PROVIDER_SITE_OTHER)

## 2024-04-14 DIAGNOSIS — D509 Iron deficiency anemia, unspecified: Secondary | ICD-10-CM

## 2024-04-14 DIAGNOSIS — Z125 Encounter for screening for malignant neoplasm of prostate: Secondary | ICD-10-CM

## 2024-04-14 LAB — CBC WITH DIFFERENTIAL/PLATELET
Basophils Absolute: 0 K/uL (ref 0.0–0.1)
Basophils Relative: 0.8 % (ref 0.0–3.0)
Eosinophils Absolute: 0.2 K/uL (ref 0.0–0.7)
Eosinophils Relative: 3.9 % (ref 0.0–5.0)
HCT: 42.7 % (ref 39.0–52.0)
Hemoglobin: 14.1 g/dL (ref 13.0–17.0)
Lymphocytes Relative: 48.3 % — ABNORMAL HIGH (ref 12.0–46.0)
Lymphs Abs: 2.1 K/uL (ref 0.7–4.0)
MCHC: 33 g/dL (ref 30.0–36.0)
MCV: 85.4 fl (ref 78.0–100.0)
Monocytes Absolute: 0.4 K/uL (ref 0.1–1.0)
Monocytes Relative: 9.1 % (ref 3.0–12.0)
Neutro Abs: 1.6 K/uL (ref 1.4–7.7)
Neutrophils Relative %: 37.9 % — ABNORMAL LOW (ref 43.0–77.0)
Platelets: 212 K/uL (ref 150.0–400.0)
RBC: 5 Mil/uL (ref 4.22–5.81)
RDW: 16.1 % — ABNORMAL HIGH (ref 11.5–15.5)
WBC: 4.3 K/uL (ref 4.0–10.5)

## 2024-04-14 LAB — PSA, MEDICARE: PSA: 0.83 ng/mL (ref 0.10–4.00)

## 2024-05-20 ENCOUNTER — Ambulatory Visit: Admitting: Gastroenterology

## 2024-05-20 ENCOUNTER — Encounter: Payer: Self-pay | Admitting: Gastroenterology

## 2024-05-20 VITALS — BP 164/84 | HR 58 | Temp 97.9°F | Ht 67.0 in | Wt 206.8 lb

## 2024-05-20 DIAGNOSIS — K760 Fatty (change of) liver, not elsewhere classified: Secondary | ICD-10-CM | POA: Diagnosis not present

## 2024-05-20 DIAGNOSIS — D508 Other iron deficiency anemias: Secondary | ICD-10-CM

## 2024-05-20 DIAGNOSIS — I8500 Esophageal varices without bleeding: Secondary | ICD-10-CM

## 2024-05-20 DIAGNOSIS — R161 Splenomegaly, not elsewhere classified: Secondary | ICD-10-CM

## 2024-05-20 NOTE — Progress Notes (Signed)
 "      Gastroenterology Office Note     Primary Care Physician:  Avelina Greig BRAVO, MD  Primary Gastroenterologist: Dr Cinderella   Chief Complaint   Chief Complaint  Patient presents with   Follow-up    Doing well, no issues     History of Present Illness   Alex Castaneda is a 68 y.o. male presenting today with a history of GERD, IDA, hepatic steatosis, found to have grade 1 varices on EGD earlier this year.  Due to findings of varices, ultrasound elastography was completed with good IQR ratio and median kPa less than 5. Fatty liver and mildly enlarged spleen noted. No thrombocytopenia.   In interim from when he was last seen, we further explored noncirrhotic portal hypertension etiologies.  Autoimmune and PBC were ruled out, He is not immune to hep B, hep C was negative, FibroSure with F1 F2 fibrosis, elf score with value of 9.18 Schistosoma was negative, hep A total antibody positive, schistosoma negative, ACE normal, alpha-1 antitrypsin negative, ceruloplasmin 21.4, MRI, MRCP without any biliary ductal dilation, no imaging signs of cirrhosis or PSC.  He did have mild to  moderate diffuse hepatic steatosis.  FibroScan showed F1-2 hepatic fibrosis, S3 hepatic steatosis.  History of mild splenomegaly in the past, and I have referred him to hematology for further evaluation.  Spleen looked enlarged at 13.1 cm on ultrasound in June, and MRI showed spleen within normal limits in September 2025.  Hematology was not concerned for any concerning etiology for splenomegaly and felt that the initial findings of splenomegaly might have been due to some acute illness.  Regarding IDA,His ferritin continues to improve and he is taking iron every other day. Platelets  normal throughout this.  Platelets have been normal.  No abdominal pain, N/V, changes in bowel habits, constipation, diarrhea, overt GI bleeding, GERD, dysphagia, unexplained weight loss, lack of appetite, unexplained weight gain.     Cardio, weights   Prilosec as needed: once or twice a week.    Once weekly Vit D  BP 164/84 recheck manually   EGD November 17, 2023: Grade I esophageal varices.                           - Gastroesophageal flap valve classified as Hill                            Grade IV (no fold, wide open lumen, hiatal hernia                            present).                           - 3 cm hiatal hernia.                           - Erythematous mucosa in the antrum. Biopsied.                           - Normal duodenal bulb and second portion of the                            duodenum. Repeat EGD in 3 years unless starting on NSBB.  Path with reactive gastritis, negative H.pylori.    Colonoscopy November 17, 2023: 3 mm sessile polyp in cecum s/p resection complete but tissue not retrieved, non-bleeding internal and external hemorrhoids  Capsule: possible small bowel erosion no active bleeding.   Past Medical History:  Diagnosis Date   Abnormal LFTs 05/15/2022   Anemia 09/30/2023   Arthritis    Benign prostatic hyperplasia with urinary hesitancy 03/05/2023   Centrilobular emphysema (HCC) 09/30/2023   Colon polyps    GERD (gastroesophageal reflux disease)    Hyperlipidemia    Hypertension    Osteoarthritis 07/13/2019   Spinal stenosis of lumbar region 07/20/2019   Thyroid  disease    Trigeminal neuralgia 07/19/2019    Past Surgical History:  Procedure Laterality Date   COLONOSCOPY N/A 11/17/2023   Procedure: COLONOSCOPY;  Surgeon: Cinderella Deatrice FALCON, MD;  [facility];  Service: Endoscopy;  Laterality: N/A;  1:30PM;ASA 2   COLONOSCOPY WITH PROPOFOL  N/A 01/01/2021   Procedure: COLONOSCOPY WITH PROPOFOL ;  Surgeon: Janalyn Keene NOVAK, MD;  Location: [facility];  Service: Endoscopy;  Laterality: N/A;   ELBOW SURGERY Right    ESOPHAGEAL DILATION N/A 11/17/2023   Procedure: DILATION, ESOPHAGUS;  Surgeon: Cinderella Deatrice FALCON, MD;  Location: [facility];  Service: Endoscopy;   Laterality: N/A;  1:30PM;ASA 2   ESOPHAGOGASTRODUODENOSCOPY N/A 11/17/2023   Procedure: EGD (ESOPHAGOGASTRODUODENOSCOPY);  Surgeon: Cinderella Deatrice FALCON, MD;  Location: [facility];  Service: Endoscopy;  Laterality: N/A;  1:30PM;ASA 2   EYE SURGERY  2000   GIVENS CAPSULE STUDY N/A 12/30/2023   Procedure: IMAGING PROCEDURE, GI TRACT, INTRALUMINAL, VIA CAPSULE;  Surgeon: Cinderella Deatrice FALCON, MD;  Location: [facility];  Service: Endoscopy;  Laterality: N/A;  730am   HERNIA REPAIR     Umbilical   KNEE SURGERY Left    arthroscopic   ROTATOR CUFF REPAIR Right    TRANSURETHRAL INCISION OF PROSTATE N/A 08/22/2023   Procedure: INCISION, PROSTATE, TRANSURETHRAL;  Surgeon: Francisca Redell BROCKS, MD;  Location: [facility];  Service: Urology;  Laterality: N/A;    Current Outpatient Medications  Medication Sig Dispense Refill   acetaminophen  (TYLENOL ) 500 MG tablet Take 500-1,000 mg by mouth every 6 (six) hours as needed (pain.).     amLODipine  (NORVASC ) 5 MG tablet Take 1 tablet (5 mg total) by mouth daily. 90 tablet 3   carboxymethylcellulose (REFRESH PLUS) 0.5 % SOLN Place 1 drop into both eyes 3 (three) times daily as needed (dry/irritated eyes.).     clindamycin -benzoyl peroxide (BENZACLIN) gel APPLY TOPICALLY TWICE DAILY TO AFFECTED AREA ON FACE, TRUNK 50 g 0   gabapentin  (NEURONTIN ) 300 MG capsule Take 300 mg by mouth 3 (three) times daily as needed.     meloxicam  (MOBIC ) 15 MG tablet Take 15 mg by mouth at bedtime as needed for pain.     Omeprazole Magnesium (PRILOSEC PO) Take by mouth. Otc - takes as needed     Vitamin D , Ergocalciferol , (DRISDOL ) 1.25 MG (50000 UNIT) CAPS capsule Take 1 capsule (50,000 Units total) by mouth every 7 (seven) days. 12 capsule 0   No current facility-administered medications for this visit.    Allergies as of 05/20/2024   (No Known Allergies)    Family History  Problem Relation Age of Onset   Throat cancer Mother        smoker   COPD Sister    Cancer Brother         unknown   Arthritis Sister    Diabetes Sister  Diabetes Sister    Stroke Brother 55       tobacco use    Social History   Socioeconomic History   Marital status: Married    Spouse name: Aleck   Number of children: 3   Years of education: Associates degree   Highest education level: Associate degree: occupational, scientist, product/process development, or vocational program  Occupational History   Not on file  Tobacco Use   Smoking status: Former    Current packs/day: 0.00    Types: Cigarettes    Quit date: 07/11/1989    Years since quitting: 34.8    Passive exposure: Past   Smokeless tobacco: Never  Vaping Use   Vaping status: Never Used  Substance and Sexual Activity   Alcohol use: Not Currently   Drug use: Never   Sexual activity: Not Currently  Other Topics Concern   Not on file  Social History Narrative   07/12/19   From: moved from [place]  - but from [place] originally    Living: with wife Aleck - 38 years   Work: retired from [employer], psychiatrist      Family: 2 children - Marissa, Medical Illustrator and [place] - live nearby [place], [place]) - grandkids - 8 - and 2 great grandchildren    5 step children      Enjoys: working on cars, walking, helping and supporting family      Exercise: walking - regularly, hard in the winter   Diet: better now      Safety   Seat belts: Yes    Guns: No   Safe in relationships: Yes    Social Drivers of Corporate Investment Banker Strain: Low Risk  (04/07/2024)   Overall Financial Resource Strain (CARDIA)    Difficulty of Paying Living Expenses: Not hard at all  Food Insecurity: No Food Insecurity (04/07/2024)   Hunger Vital Sign    Worried About Running Out of Food in the Last Year: Never true    Ran Out of Food in the Last Year: Never true  Transportation Needs: No Transportation Needs (04/07/2024)   PRAPARE - Administrator, Civil Service (Medical): No    Lack of Transportation (Non-Medical): No  Physical Activity: Sufficiently Active  (04/07/2024)   Exercise Vital Sign    Days of Exercise per Week: 7 days    Minutes of Exercise per Session: 60 min  Stress: No Stress Concern Present (04/07/2024)   [employer] of Occupational Health - Occupational Stress Questionnaire    Feeling of Stress: Not at all  Social Connections: Socially Integrated (04/07/2024)   Social Connection and Isolation Panel    Frequency of Communication with Friends and Family: More than three times a week    Frequency of Social Gatherings with Friends and Family: Three times a week    Attends Religious Services: More than 4 times per year    Active Member of Clubs or Organizations: Yes    Attends Banker Meetings: More than 4 times per year    Marital Status: Married  Catering Manager Violence: Not At Risk (03/24/2024)   Humiliation, Afraid, Rape, and Kick questionnaire    Fear of Current or Ex-Partner: No    Emotionally Abused: No    Physically Abused: No    Sexually Abused: No     Review of Systems   Gen: Denies any fever, chills, fatigue, weight loss, lack of appetite.  CV: Denies chest pain, heart palpitations, peripheral edema, syncope.  Resp: Denies shortness  of breath at rest or with exertion. Denies wheezing or cough.  GI: Denies dysphagia or odynophagia. Denies jaundice, hematemesis, fecal incontinence. GU : Denies urinary burning, urinary frequency, urinary hesitancy MS: Denies joint pain, muscle weakness, cramps, or limitation of movement.  Derm: Denies rash, itching, dry skin Psych: Denies depression, anxiety, memory loss, and confusion Heme: Denies bruising, bleeding, and enlarged lymph nodes.   Physical Exam   BP (!) 164/84   Pulse (!) 58   Temp 97.9 F (36.6 C) (Oral)   Ht 5' 7 (1.702 m)   Wt 206 lb 12.8 oz (93.8 kg)   SpO2 97%   BMI 32.39 kg/m  General:   Alert and oriented. Pleasant and cooperative. Well-nourished and well-developed.  Head:  Normocephalic and atraumatic. Eyes:  Without  icterus Abdomen:  +BS, soft, non-tender and non-distended. No HSM noted. No guarding or rebound. No masses appreciated.  Rectal:  Deferred  Msk:  Symmetrical without gross deformities. Normal posture. Extremities:  Without edema. Neurologic:  Alert and  oriented x4;  grossly normal neurologically. Skin:  Intact without significant lesions or rashes. Psych:  Alert and cooperative. Normal mood and affect.  Lab Results  Component Value Date   ALT 26 05/20/2024   AST 24 05/20/2024   ALKPHOS 73 05/20/2024   BILITOT 0.7 05/20/2024      Assessment   Jedrek Dinovo Sheley is a 68 y.o. male presenting today with a history of  GERD, IDA, hepatic steatosis with F1/F2 fibrosis, found to have grade 1 varices on EGD earlier this year.     Further evaluation of possible noncirrhotic portal hypertension has been undertaken including extensive serologies to exclude schistosoma, autoimmune, PBC, viral hepatitis., ACE level normal, ceruloplasmin and alpha 1 trypsin normal/negative, MRCP without findings of PSC,No obstructive findings.  Normal portal vein.  Mild splenomegaly noted on ultrasound earlier this year, but this was normal on MR CP recently.  Hematology has also evaluated him to rule out any underlying other causes for splenomegaly.  He has no known heart disease that we are aware.  Elf score is 9.18.  He has worked extensively on diet and behavior changes and purposeful weight loss. LFTs remaining normal.   At this point, query if mild fibrosis could be contributing to innocent-appearing varices on EGD. We discussed possible liver biopsy but holding off on this for now. Could consider ECHO.   Regarding IDA, continues without anemia with normal Hgb and ferritin is improving with oral replacement. Colonoscopy, EGD, and capsule all on file. Celiac negative. Holding off on enterography unless recurrent IDA.      PLAN    Check iron studies, CBC today: if ferritin above 50 will have him stop iron ELF,  HFP every 6 months, check now Fibroscan in Aug 2026 EGD in 2028 or sooner if changes Liver biopsy if any bumps in LFTs, changes in clinical status.  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 If ELF climbs or if fibroscan shows progression, needs to start therapy.  Return in 6 months or sooner if needed   Therisa MICAEL Stager, PhD, ANP-BC [facility]     "

## 2024-05-20 NOTE — Patient Instructions (Addendum)
 Please have blood work today, and I will message with results.  At this point, we will check the fibrosis score and liver numbers every 6 months, starting this month.   We will plan on the FibroScan next August 2026.  Continue with excellent diet and behavior modifications, exercise, and close follow-up with primary care.  We will hold off on any medications for fatty liver right now.  We will update the upper endoscopy in 3 years or sooner if we need to.  Have a wonderful holiday season, and please reach out if you need anything in the interim!  I enjoyed seeing you again today! I value our relationship and want to provide genuine, compassionate, and quality care. You may receive a survey regarding your visit with me, and I welcome your feedback! Thanks so much for taking the time to complete this. I look forward to seeing you again.      Therisa MICAEL Stager, PhD, ANP-BC [facility]

## 2024-05-27 LAB — IRON,TIBC AND FERRITIN PANEL
Ferritin: 44 ng/mL (ref 30–400)
Iron Saturation: 29 % (ref 15–55)
Iron: 113 ug/dL (ref 38–169)
Total Iron Binding Capacity: 389 ug/dL (ref 250–450)
UIBC: 276 ug/dL (ref 111–343)

## 2024-05-27 LAB — CBC WITH DIFFERENTIAL/PLATELET
Basophils Absolute: 0 x10E3/uL (ref 0.0–0.2)
Basos: 1 %
EOS (ABSOLUTE): 0.2 x10E3/uL (ref 0.0–0.4)
Eos: 5 %
Hematocrit: 44.3 % (ref 37.5–51.0)
Hemoglobin: 14.6 g/dL (ref 13.0–17.7)
Immature Grans (Abs): 0 x10E3/uL (ref 0.0–0.1)
Immature Granulocytes: 0 %
Lymphocytes Absolute: 1.9 x10E3/uL (ref 0.7–3.1)
Lymphs: 44 %
MCH: 28.6 pg (ref 26.6–33.0)
MCHC: 33 g/dL (ref 31.5–35.7)
MCV: 87 fL (ref 79–97)
Monocytes Absolute: 0.5 x10E3/uL (ref 0.1–0.9)
Monocytes: 12 %
Neutrophils Absolute: 1.6 x10E3/uL (ref 1.4–7.0)
Neutrophils: 38 %
Platelets: 220 x10E3/uL (ref 150–450)
RBC: 5.1 x10E6/uL (ref 4.14–5.80)
RDW: 15.6 % — ABNORMAL HIGH (ref 11.6–15.4)
WBC: 4.2 x10E3/uL (ref 3.4–10.8)

## 2024-05-27 LAB — COMPREHENSIVE METABOLIC PANEL WITH GFR
ALT: 26 IU/L (ref 0–44)
AST: 24 IU/L (ref 0–40)
Albumin: 4.5 g/dL (ref 3.9–4.9)
Alkaline Phosphatase: 73 IU/L (ref 47–123)
BUN/Creatinine Ratio: 8 — ABNORMAL LOW (ref 10–24)
BUN: 9 mg/dL (ref 8–27)
Bilirubin Total: 0.7 mg/dL (ref 0.0–1.2)
CO2: 23 mmol/L (ref 20–29)
Calcium: 9.8 mg/dL (ref 8.6–10.2)
Chloride: 105 mmol/L (ref 96–106)
Creatinine, Ser: 1.15 mg/dL (ref 0.76–1.27)
Globulin, Total: 2.5 g/dL (ref 1.5–4.5)
Glucose: 86 mg/dL (ref 70–99)
Potassium: 4.4 mmol/L (ref 3.5–5.2)
Sodium: 142 mmol/L (ref 134–144)
Total Protein: 7 g/dL (ref 6.0–8.5)
eGFR: 70 mL/min/1.73 (ref >59)

## 2024-05-27 LAB — ENHANCED LIVER FIBROSIS (ELF): ELF(TM) Score: 9.26 (ref <9.80)

## 2024-05-30 ENCOUNTER — Ambulatory Visit: Payer: Self-pay | Admitting: Gastroenterology

## 2024-07-13 ENCOUNTER — Other Ambulatory Visit: Payer: Self-pay | Admitting: Family Medicine

## 2024-07-13 ENCOUNTER — Encounter: Payer: Self-pay | Admitting: Family Medicine

## 2024-07-13 DIAGNOSIS — L739 Follicular disorder, unspecified: Secondary | ICD-10-CM

## 2024-07-13 NOTE — Telephone Encounter (Signed)
 Last office visit 04/08/2024 for CPE.  Last refilled 09/25/23 for 50 g with no refills. Next appt: No future appointments with PCP.

## 2024-07-13 NOTE — Telephone Encounter (Signed)
 See refill request.

## 2024-11-18 ENCOUNTER — Ambulatory Visit: Admitting: Gastroenterology

## 2025-03-25 ENCOUNTER — Ambulatory Visit

## 2025-04-06 ENCOUNTER — Ambulatory Visit: Admitting: Urology
